# Patient Record
Sex: Female | Born: 1960 | Race: White | Hispanic: No | Marital: Married | Smoking: Never smoker
Health system: Southern US, Community
[De-identification: ages and names within clinical notes are randomized; demographics above are authoritative.]

## PROBLEM LIST (undated history)

## (undated) DIAGNOSIS — Q185 Microstomia: Secondary | ICD-10-CM

## (undated) DIAGNOSIS — I10 Essential (primary) hypertension: Secondary | ICD-10-CM

## (undated) DIAGNOSIS — E039 Hypothyroidism, unspecified: Secondary | ICD-10-CM

## (undated) DIAGNOSIS — M797 Fibromyalgia: Secondary | ICD-10-CM

## (undated) DIAGNOSIS — E7800 Pure hypercholesterolemia, unspecified: Secondary | ICD-10-CM

## (undated) DIAGNOSIS — M1990 Unspecified osteoarthritis, unspecified site: Secondary | ICD-10-CM

## (undated) DIAGNOSIS — Z955 Presence of coronary angioplasty implant and graft: Secondary | ICD-10-CM

## (undated) DIAGNOSIS — Z8639 Personal history of other endocrine, nutritional and metabolic disease: Secondary | ICD-10-CM

## (undated) DIAGNOSIS — E0590 Thyrotoxicosis, unspecified without thyrotoxic crisis or storm: Secondary | ICD-10-CM

## (undated) DIAGNOSIS — N200 Calculus of kidney: Secondary | ICD-10-CM

## (undated) DIAGNOSIS — M19049 Primary osteoarthritis, unspecified hand: Secondary | ICD-10-CM

## (undated) DIAGNOSIS — T4145XA Adverse effect of unspecified anesthetic, initial encounter: Secondary | ICD-10-CM

## (undated) DIAGNOSIS — T8859XA Other complications of anesthesia, initial encounter: Secondary | ICD-10-CM

## (undated) DIAGNOSIS — T884XXA Failed or difficult intubation, initial encounter: Secondary | ICD-10-CM

## (undated) DIAGNOSIS — Z8669 Personal history of other diseases of the nervous system and sense organs: Secondary | ICD-10-CM

## (undated) DIAGNOSIS — Z87442 Personal history of urinary calculi: Secondary | ICD-10-CM

## (undated) DIAGNOSIS — R519 Headache, unspecified: Secondary | ICD-10-CM

## (undated) DIAGNOSIS — I2510 Atherosclerotic heart disease of native coronary artery without angina pectoris: Secondary | ICD-10-CM

## (undated) HISTORY — PX: COLONOSCOPY: SHX174

## (undated) HISTORY — PX: JOINT REPLACEMENT: SHX530

## (undated) HISTORY — PX: DILATION AND CURETTAGE OF UTERUS: SHX78

## (undated) HISTORY — PX: CARDIAC CATHETERIZATION: SHX172

## (undated) HISTORY — PX: KNEE ARTHROPLASTY: SHX992

## (undated) HISTORY — PX: OVARIAN CYST REMOVAL: SHX89

## (undated) HISTORY — PX: COLON SURGERY: SHX602

## (undated) HISTORY — PX: WISDOM TOOTH EXTRACTION: SHX21

## (undated) HISTORY — PX: HAND SURGERY: SHX662

## (undated) HISTORY — PX: FINGER SURGERY: SHX640

## (undated) HISTORY — DX: Calculus of kidney: N20.0

## (undated) HISTORY — PX: HERNIA REPAIR: SHX51

## (undated) HISTORY — PX: ESOPHAGOGASTRODUODENOSCOPY: SHX1529

---

## 1982-01-03 HISTORY — PX: APPENDECTOMY: SHX54

## 1991-01-04 HISTORY — PX: TUBAL LIGATION: SHX77

## 2006-02-07 ENCOUNTER — Ambulatory Visit (HOSPITAL_BASED_OUTPATIENT_CLINIC_OR_DEPARTMENT_OTHER): Admission: RE | Admit: 2006-02-07 | Discharge: 2006-02-07 | Payer: Self-pay | Admitting: Orthopedic Surgery

## 2011-01-04 HISTORY — PX: ABDOMINAL SURGERY: SHX537

## 2011-04-04 DIAGNOSIS — Z955 Presence of coronary angioplasty implant and graft: Secondary | ICD-10-CM

## 2011-04-04 HISTORY — DX: Presence of coronary angioplasty implant and graft: Z95.5

## 2011-04-04 HISTORY — PX: CORONARY ANGIOPLASTY: SHX604

## 2012-01-04 HISTORY — PX: HAND SURGERY: SHX662

## 2012-04-03 HISTORY — PX: ABDOMINAL HERNIA REPAIR: SHX539

## 2012-07-03 DIAGNOSIS — T884XXA Failed or difficult intubation, initial encounter: Secondary | ICD-10-CM

## 2012-07-03 HISTORY — DX: Failed or difficult intubation, initial encounter: T88.4XXA

## 2012-07-03 HISTORY — PX: CHOLECYSTECTOMY: SHX55

## 2012-12-03 DIAGNOSIS — M19049 Primary osteoarthritis, unspecified hand: Secondary | ICD-10-CM

## 2012-12-03 HISTORY — DX: Primary osteoarthritis, unspecified hand: M19.049

## 2012-12-18 ENCOUNTER — Other Ambulatory Visit: Payer: Self-pay | Admitting: Orthopedic Surgery

## 2012-12-21 ENCOUNTER — Encounter (HOSPITAL_BASED_OUTPATIENT_CLINIC_OR_DEPARTMENT_OTHER): Payer: Self-pay | Admitting: *Deleted

## 2012-12-21 NOTE — Pre-Procedure Instructions (Signed)
Cardiac hx. and hx. of difficult intubation discussed with Dr. Krista Blue; pt. OK to come for surgery.

## 2012-12-21 NOTE — Pre-Procedure Instructions (Signed)
EKG, office note, cardiac testing results req. from Dr. Bryson Dames office, [place], [place]

## 2012-12-24 NOTE — H&P (Signed)
Shannon Jennings is an 52 y.o. female.   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 She works at the [employer] in [place]. She has had significant bilateral small joint arthritis and is s/p removal of a mucoid cyst from her right ring finger DIP joint in February 2008.   Following debridement of her joint she went on to a spontaneous arthrodesis of her right ring finger DIP joint. She now has severe pain in both small finger DIP joints that is impairing her work functions and activities of daily living. She returns at this time requesting treatment of her small fingers, specifically fusion as she has been very pleased with her pain relief in the ring finger DIP joint.     Past Medical History  Diagnosis Date  . History of migraine   . Hypothyroidism   . Coronary artery disease   . History of Graves' disease   . Fibromyalgia   . Osteoarthritis   . Thalassemia minor   . 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 Hypertension     under control with med., has been on med. x 4 yr.  . High cholesterol   . S/P coronary artery stent placement 04/2011    states right coronary artery  . Abnormally small mouth   . Difficult intubation 07/2012    Past Surgical History  Procedure Laterality Date  . Cardiac catheterization    . Coronary angioplasty  04/2011    stent x 1  . Cholecystectomy  07/2012  . Abdominal hernia repair  04/2012  . Abdominal surgery  01/2011    for intestinal blockage    History reviewed. No pertinent family history. Social History:  reports that she has never smoked. She has never used smokeless tobacco. She reports that she does not drink alcohol or use illicit drugs.  Allergies:  Allergies  Allergen Reactions  . Penicillins Rash  . Sulfa Antibiotics Rash    No prescriptions prior to admission    No results found for  this or any previous visit (from the past 48 hour(s)).  No results found.   Pertinent items are noted in HPI.  Height 5\' 3"  (1.6 m), weight 61.236 kg (135 lb).  General appearance: alert Head: Normocephalic, without obvious abnormality Neck: supple, symmetrical, trachea midline Resp: clear to auscultation bilaterally Cardio: regular rate and rhythm GI: normal findings: bowel sounds normal Extremities:. Inspection of her hands reveals Heberden's nodes. She is clinically solid and pain free arthrodesis of her right ring finger DIP joint. She has pain with any motion of her small finger DIP joints. She has well preserved flexion of the PIP joints. Her thumbs reveal mild CMC arthrosis with pain on stress testing. Her pulses and capillary refill are intact. Her MP joint motion is full.  X-rays of her small fingers bilaterally reveals bone on bone arthropathy. Her PIP and MP joints have preserved hyaline cartilage spaces.    Pulses: 2+ and symmetric Skin: mobility and turgor normal Neurologic: Grossly normal    Assessment/Plan Impression: End stage OA degeneration right and left small finger DIP joints  Plan: To the OR for arthrodesis right and left small finger DIP joints.The procedure, risks,benefits and post-op course were discussed with the patient at length and they were in agreement with the plan.  DASNOIT,Meir Elwood J 12/24/2012, 3:19 PM  H&P documentation:  12/25/2012  -History and Physical Reviewed  -Patient has been re-examined  -No change in the plan of care  Wyn Forster, MD

## 2012-12-25 ENCOUNTER — Encounter (HOSPITAL_BASED_OUTPATIENT_CLINIC_OR_DEPARTMENT_OTHER): Payer: Self-pay | Admitting: Orthopedic Surgery

## 2012-12-25 ENCOUNTER — Encounter (HOSPITAL_BASED_OUTPATIENT_CLINIC_OR_DEPARTMENT_OTHER): Payer: BC Managed Care – PPO | Admitting: Anesthesiology

## 2012-12-25 ENCOUNTER — Ambulatory Visit (HOSPITAL_BASED_OUTPATIENT_CLINIC_OR_DEPARTMENT_OTHER): Payer: BC Managed Care – PPO | Admitting: Anesthesiology

## 2012-12-25 ENCOUNTER — Encounter (HOSPITAL_BASED_OUTPATIENT_CLINIC_OR_DEPARTMENT_OTHER)
Admission: RE | Disposition: A | Discharge status: Home / Self Care | Payer: Self-pay | Source: Ambulatory Visit | Attending: Orthopedic Surgery

## 2012-12-25 ENCOUNTER — Ambulatory Visit (HOSPITAL_BASED_OUTPATIENT_CLINIC_OR_DEPARTMENT_OTHER)
Admission: RE | Admit: 2012-12-25 | Discharge: 2012-12-25 | Disposition: A | Discharge status: Home / Self Care | Payer: BC Managed Care – PPO | Source: Ambulatory Visit | Attending: Orthopedic Surgery | Admitting: Orthopedic Surgery

## 2012-12-25 DIAGNOSIS — Z9861 Coronary angioplasty status: Secondary | ICD-10-CM | POA: Insufficient documentation

## 2012-12-25 DIAGNOSIS — Z880 Allergy status to penicillin: Secondary | ICD-10-CM | POA: Insufficient documentation

## 2012-12-25 DIAGNOSIS — M19049 Primary osteoarthritis, unspecified hand: Secondary | ICD-10-CM | POA: Insufficient documentation

## 2012-12-25 DIAGNOSIS — D563 Thalassemia minor: Secondary | ICD-10-CM | POA: Insufficient documentation

## 2012-12-25 DIAGNOSIS — E039 Hypothyroidism, unspecified: Secondary | ICD-10-CM | POA: Insufficient documentation

## 2012-12-25 DIAGNOSIS — E7800 Pure hypercholesterolemia, unspecified: Secondary | ICD-10-CM | POA: Insufficient documentation

## 2012-12-25 DIAGNOSIS — I10 Essential (primary) hypertension: Secondary | ICD-10-CM | POA: Insufficient documentation

## 2012-12-25 DIAGNOSIS — M064 Inflammatory polyarthropathy: Secondary | ICD-10-CM | POA: Insufficient documentation

## 2012-12-25 DIAGNOSIS — I2510 Atherosclerotic heart disease of native coronary artery without angina pectoris: Secondary | ICD-10-CM | POA: Insufficient documentation

## 2012-12-25 HISTORY — DX: Fibromyalgia: M79.7

## 2012-12-25 HISTORY — DX: Personal history of other diseases of the nervous system and sense organs: Z86.69

## 2012-12-25 HISTORY — PX: DISTAL INTERPHALANGEAL JOINT FUSION: SHX6428

## 2012-12-25 HISTORY — DX: Unspecified osteoarthritis, unspecified site: M19.90

## 2012-12-25 HISTORY — DX: Atherosclerotic heart disease of native coronary artery without angina pectoris: I25.10

## 2012-12-25 HISTORY — DX: Personal history of other endocrine, nutritional and metabolic disease: Z86.39

## 2012-12-25 HISTORY — DX: Microstomia: Q18.5

## 2012-12-25 HISTORY — DX: Presence of coronary angioplasty implant and graft: Z95.5

## 2012-12-25 HISTORY — DX: Hypothyroidism, unspecified: E03.9

## 2012-12-25 HISTORY — DX: Essential (primary) hypertension: I10

## 2012-12-25 HISTORY — DX: Primary osteoarthritis, unspecified hand: M19.049

## 2012-12-25 HISTORY — DX: Failed or difficult intubation, initial encounter: T88.4XXA

## 2012-12-25 HISTORY — DX: Pure hypercholesterolemia, unspecified: E78.00

## 2012-12-25 LAB — POCT HEMOGLOBIN-HEMACUE: Hemoglobin: 10.7 g/dL — ABNORMAL LOW (ref 12.0–15.0)

## 2012-12-25 SURGERY — DISTAL INTERPHALANGEAL JOINT FUSION
Anesthesia: General | Site: Finger | Laterality: Bilateral

## 2012-12-25 MED ORDER — OXYCODONE HCL 5 MG PO TABS
ORAL_TABLET | ORAL | Status: AC
  Filled 2012-12-25: qty 1

## 2012-12-25 MED ORDER — LACTATED RINGERS IV SOLN
INTRAVENOUS | Status: DC
  Administered 2012-12-25 (×2): via INTRAVENOUS

## 2012-12-25 MED ORDER — MIDAZOLAM HCL 2 MG/2ML IJ SOLN
INTRAMUSCULAR | Status: AC
  Filled 2012-12-25: qty 2

## 2012-12-25 MED ORDER — MIDAZOLAM HCL 2 MG/2ML IJ SOLN: 1.0000 mg | INTRAMUSCULAR | Status: DC | PRN

## 2012-12-25 MED ORDER — FENTANYL CITRATE 0.05 MG/ML IJ SOLN
INTRAMUSCULAR | Status: AC
  Filled 2012-12-25: qty 2

## 2012-12-25 MED ORDER — OXYCODONE HCL 5 MG PO TABS
5.0000 mg | ORAL_TABLET | Freq: Once | ORAL | Status: AC | PRN
  Administered 2012-12-25: 5 mg via ORAL

## 2012-12-25 MED ORDER — LIDOCAINE HCL (CARDIAC) 20 MG/ML IV SOLN
INTRAVENOUS | Status: DC | PRN
  Administered 2012-12-25: 80 mg via INTRAVENOUS

## 2012-12-25 MED ORDER — HYDROMORPHONE HCL 2 MG PO TABS: ORAL_TABLET | ORAL | Status: DC

## 2012-12-25 MED ORDER — HYDROMORPHONE HCL PF 1 MG/ML IJ SOLN
0.2500 mg | INTRAMUSCULAR | Status: DC | PRN
  Administered 2012-12-25 (×4): 0.5 mg via INTRAVENOUS

## 2012-12-25 MED ORDER — ONDANSETRON HCL 4 MG/2ML IJ SOLN
INTRAMUSCULAR | Status: DC | PRN
  Administered 2012-12-25: 4 mg via INTRAVENOUS

## 2012-12-25 MED ORDER — HYDROMORPHONE HCL PF 1 MG/ML IJ SOLN
INTRAMUSCULAR | Status: AC
  Filled 2012-12-25: qty 1

## 2012-12-25 MED ORDER — PROPOFOL 10 MG/ML IV BOLUS
INTRAVENOUS | Status: DC | PRN
  Administered 2012-12-25: 150 mg via INTRAVENOUS

## 2012-12-25 MED ORDER — VANCOMYCIN HCL IN DEXTROSE 1-5 GM/200ML-% IV SOLN
1000.0000 mg | INTRAVENOUS | Status: AC
  Administered 2012-12-25: 1000 mg via INTRAVENOUS

## 2012-12-25 MED ORDER — FENTANYL CITRATE 0.05 MG/ML IJ SOLN
INTRAMUSCULAR | Status: DC | PRN
  Administered 2012-12-25 (×3): 25 ug via INTRAVENOUS
  Administered 2012-12-25: 100 ug via INTRAVENOUS
  Administered 2012-12-25: 25 ug via INTRAVENOUS

## 2012-12-25 MED ORDER — DOXYCYCLINE HYCLATE 100 MG PO TABS: 100.0000 mg | ORAL_TABLET | Freq: Two times a day (BID) | ORAL | Status: DC

## 2012-12-25 MED ORDER — ONDANSETRON HCL 4 MG/2ML IJ SOLN: 4.0000 mg | Freq: Once | INTRAMUSCULAR | Status: DC | PRN

## 2012-12-25 MED ORDER — LIDOCAINE HCL 2 % IJ SOLN
INTRAMUSCULAR | Status: DC | PRN
  Administered 2012-12-25: 5.5 mL

## 2012-12-25 MED ORDER — MEPERIDINE HCL 25 MG/ML IJ SOLN: 6.2500 mg | INTRAMUSCULAR | Status: DC | PRN

## 2012-12-25 MED ORDER — OXYCODONE HCL 5 MG/5ML PO SOLN: 5.0000 mg | Freq: Once | ORAL | Status: AC | PRN

## 2012-12-25 MED ORDER — DEXAMETHASONE SODIUM PHOSPHATE 10 MG/ML IJ SOLN
INTRAMUSCULAR | Status: DC | PRN
  Administered 2012-12-25: 10 mg via INTRAVENOUS

## 2012-12-25 MED ORDER — MIDAZOLAM HCL 5 MG/5ML IJ SOLN
INTRAMUSCULAR | Status: DC | PRN
  Administered 2012-12-25: 2 mg via INTRAVENOUS

## 2012-12-25 MED ORDER — FENTANYL CITRATE 0.05 MG/ML IJ SOLN: 50.0000 ug | INTRAMUSCULAR | Status: DC | PRN

## 2012-12-25 MED ORDER — VANCOMYCIN HCL IN DEXTROSE 1-5 GM/200ML-% IV SOLN
INTRAVENOUS | Status: AC
  Filled 2012-12-25: qty 200

## 2012-12-25 MED ORDER — CHLORHEXIDINE GLUCONATE 4 % EX LIQD: 60.0000 mL | Freq: Once | CUTANEOUS | Status: DC

## 2012-12-25 MED ORDER — MIDAZOLAM HCL 2 MG/ML PO SYRP: 12.0000 mg | ORAL_SOLUTION | Freq: Once | ORAL | Status: DC | PRN

## 2012-12-25 SURGICAL SUPPLY — 76 items
BANDAGE ADH SHEER 1  50/CT (GAUZE/BANDAGES/DRESSINGS) IMPLANT
BANDAGE COBAN STERILE 2 (GAUZE/BANDAGES/DRESSINGS) IMPLANT
BANDAGE ELASTIC 3 VELCRO ST LF (GAUZE/BANDAGES/DRESSINGS) IMPLANT
BLADE MINI RND TIP GREEN BEAV (BLADE) IMPLANT
BLADE SURG 15 STRL LF DISP TIS (BLADE) ×1 IMPLANT
BLADE SURG 15 STRL SS (BLADE) ×2
BNDG CMPR 9X4 STRL LF SNTH (GAUZE/BANDAGES/DRESSINGS) ×1
BNDG CMPR MD 5X2 ELC HKLP STRL (GAUZE/BANDAGES/DRESSINGS)
BNDG COHESIVE 1X5 TAN STRL LF (GAUZE/BANDAGES/DRESSINGS) ×3 IMPLANT
BNDG COHESIVE 3X5 TAN STRL LF (GAUZE/BANDAGES/DRESSINGS) IMPLANT
BNDG CONFORM 3 STRL LF (GAUZE/BANDAGES/DRESSINGS) IMPLANT
BNDG ELASTIC 2 VLCR STRL LF (GAUZE/BANDAGES/DRESSINGS) IMPLANT
BNDG ESMARK 4X9 LF (GAUZE/BANDAGES/DRESSINGS) ×2 IMPLANT
BNDG GAUZE ELAST 4 BULKY (GAUZE/BANDAGES/DRESSINGS) IMPLANT
BRUSH SCRUB EZ PLAIN DRY (MISCELLANEOUS) ×3 IMPLANT
BUR EGG/OVAL CARBIDE (BURR) IMPLANT
BUR FAST CUTTING MED (BURR) IMPLANT
CORDS BIPOLAR (ELECTRODE) ×2 IMPLANT
COVER MAYO STAND STRL (DRAPES) ×2 IMPLANT
COVER TABLE BACK 60X90 (DRAPES) ×2 IMPLANT
CUFF TOURNIQUET SINGLE 18IN (TOURNIQUET CUFF) ×2 IMPLANT
DECANTER SPIKE VIAL GLASS SM (MISCELLANEOUS) IMPLANT
DRAPE EXTREMITY T 121X128X90 (DRAPE) ×2 IMPLANT
DRAPE OEC MINIVIEW 54X84 (DRAPES) ×2 IMPLANT
DRAPE SURG 17X23 STRL (DRAPES) ×3 IMPLANT
DRSG TEGADERM 4X4.75 (GAUZE/BANDAGES/DRESSINGS) IMPLANT
GAUZE XEROFORM 1X8 LF (GAUZE/BANDAGES/DRESSINGS) ×1 IMPLANT
GLOVE BIO SURGEON STRL SZ7 (GLOVE) ×1 IMPLANT
GLOVE BIOGEL M STRL SZ7.5 (GLOVE) ×2 IMPLANT
GLOVE BIOGEL PI IND STRL 8 (GLOVE) ×1 IMPLANT
GLOVE BIOGEL PI INDICATOR 8 (GLOVE) ×1
GLOVE ORTHO TXT STRL SZ7.5 (GLOVE) ×2 IMPLANT
GOWN BRE IMP PREV XXLGXLNG (GOWN DISPOSABLE) ×4 IMPLANT
GOWN PREVENTION PLUS XLARGE (GOWN DISPOSABLE) ×1 IMPLANT
GOWN PREVENTION PLUS XXLARGE (GOWN DISPOSABLE) ×1 IMPLANT
K-WIRE .035X4 (WIRE) ×1 IMPLANT
K-WIRE .045X4 (WIRE) IMPLANT
K-WIRE PROS .028 4 (WIRE) ×2 IMPLANT
LOOP VESSEL MAXI BLUE (MISCELLANEOUS) IMPLANT
NDL HYPO 25X1 1.5 SAFETY (NEEDLE) IMPLANT
NDL SAFETY ECLIPSE 18X1.5 (NEEDLE) IMPLANT
NEEDLE 27GAX1X1/2 (NEEDLE) ×1 IMPLANT
NEEDLE HYPO 18GX1.5 SHARP (NEEDLE)
NEEDLE HYPO 25X1 1.5 SAFETY (NEEDLE) IMPLANT
NS IRRIG 1000ML POUR BTL (IV SOLUTION) ×2 IMPLANT
PACK BASIN DAY SURGERY FS (CUSTOM PROCEDURE TRAY) ×2 IMPLANT
PAD CAST 3X4 CTTN HI CHSV (CAST SUPPLIES) IMPLANT
PADDING CAST ABS 4INX4YD NS (CAST SUPPLIES) ×1
PADDING CAST ABS COTTON 4X4 ST (CAST SUPPLIES) ×1 IMPLANT
PADDING CAST COTTON 3X4 STRL (CAST SUPPLIES)
PADDING UNDERCAST 2  STERILE (CAST SUPPLIES) ×2 IMPLANT
SCREW SELF TAP CORTEX 1.0 14MM (Screw) ×2 IMPLANT
SLEEVE SCD COMPRESS KNEE MED (MISCELLANEOUS) ×1 IMPLANT
SPLINT FINGER 2.25 911902 (SOFTGOODS) ×2 IMPLANT
SPLINT PLASTER CAST XFAST 3X15 (CAST SUPPLIES) IMPLANT
SPLINT PLASTER XTRA FASTSET 3X (CAST SUPPLIES)
SPONGE GAUZE 4X4 12PLY (GAUZE/BANDAGES/DRESSINGS) ×2 IMPLANT
STOCKINETTE 4X48 STRL (DRAPES) ×3 IMPLANT
STRIP CLOSURE SKIN 1/2X4 (GAUZE/BANDAGES/DRESSINGS) ×1 IMPLANT
SUT ETHILON 5 0 P 3 18 (SUTURE)
SUT FIBERWIRE 3-0 18 TAPR NDL (SUTURE)
SUT MERSILENE 4 0 P 3 (SUTURE) ×1 IMPLANT
SUT NYLON ETHILON 5-0 P-3 1X18 (SUTURE) IMPLANT
SUT PROLENE 3 0 PS 2 (SUTURE) IMPLANT
SUT PROLENE 4 0 P 3 18 (SUTURE) ×1 IMPLANT
SUT STEEL 0 (SUTURE) ×2
SUT STEEL 0 18XMFL TIE 17 (SUTURE) IMPLANT
SUT VIC AB 4-0 P-3 18XBRD (SUTURE) IMPLANT
SUT VIC AB 4-0 P3 18 (SUTURE)
SUTURE FIBERWR 3-0 18 TAPR NDL (SUTURE) IMPLANT
SYR 3ML 23GX1 SAFETY (SYRINGE) IMPLANT
SYR BULB 3OZ (MISCELLANEOUS) ×2 IMPLANT
SYR CONTROL 10ML LL (SYRINGE) ×2 IMPLANT
TOWEL OR 17X24 6PK STRL BLUE (TOWEL DISPOSABLE) ×4 IMPLANT
TRAY DSU PREP LF (CUSTOM PROCEDURE TRAY) ×2 IMPLANT
UNDERPAD 30X30 INCONTINENT (UNDERPADS AND DIAPERS) ×2 IMPLANT

## 2012-12-25 NOTE — Op Note (Signed)
NAMEJEMIMAH, Shannon Jennings                 ACCOUNT NO.:  [id number]  MEDICAL RECORD NO.:  [id number]  LOCATION:                                 FACILITY:  PHYSICIAN:  Shannon Fitch. Emrik Erhard, M.D.      DATE OF BIRTH:  DATE OF PROCEDURE:  12/25/2012 DATE OF DISCHARGE:                              OPERATIVE REPORT   PREOPERATIVE DIAGNOSIS:  Severe osteoarthritis with painful distal interphalangeal joint degenerative arthritis/inflammatory arthritis.  POSTOPERATIVE DIAGNOSIS:  Severe osteoarthritis with painful distal interphalangeal joint degenerative arthritis/inflammatory arthritis.  OPERATION: 1. Arthrodesis of right small finger DIP joint at 30-degree flexion     utilizing transosseous 1.1 mm ASIF screws. 2. Arthrodesis of left small finger DIP joint at 20-degree flexion     utilizing Kirschner wire and figure-of-eight tension band     construct.  OPERATING SURGEON:  Shannon Fitch. Najai Waszak, M.D.  ASSISTANT:  Marveen Reeks Dasnoit, PA-C  ANESTHESIA:  General by LMA supplemented by right small and left small digital blocks placed at the conclusion of surgery for perioperative analgesia.  SUPERVISING ANESTHESIOLOGIST:  Kaylyn Layer. Michelle Piper, M.D.  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 Pollyann Savoy for bilateral small finger distal interphalangeal joint arthrodesis.  She has a history of very painful swelling and impaired motion of her small finger DIP joint.  This is troublesome to the point where she requests arthrodesis.  She has had a previous mucoid cyst surgery in the past. Preoperatively, she was advised of the complexity of fusing DIP joints. Despite our best efforts due to the small bone structure of the small finger DIP joint, it is very difficult to use inter-medullary devices. We typically use tension band or lag screw technique.  After detailed informed consent, she is brought to the operating room  at this time.  We did inform her there is about 10% chance of nonunion. Anesthesia consultation was provided by Dr. Michelle Piper.  He recommended general anesthesia by LMA technique.  This was accepted by Shannon Jennings.  She was subsequently transferred to room 6 of the [facility] and placed in a supine position upon the operating table. Following induction of general anesthesia by LMA technique under Dr. Deirdre Priest direct supervision, her right and left arms were prepped with Betadine soap and solution, sterilely draped.  A pneumatic tourniquet was applied to the proximal forearm on the right due to antecubital IV access on the right, and a pneumatic tourniquet was applied to the proximal left brachium.  1 g of vancomycin was administered as an IV prophylactic antibiotic due to allergies to penicillin and sulfa antibiotics.  Following exsanguination of the right hand and arm with a direct compression technique, the arterial tourniquet was inflated on the proximal forearm to 220 mmHg.  The procedure commenced with a curvilinear incision on the dorsal aspect of the DIP joint.  The extensor mechanism was exposed and transected 3 mm proximal to its insertion at the distal phalanx.  The DIP joint was opened by releasing the collateral ligaments, and a cup and cone type arthrodesis was accomplished with a power bur and rongeur.  On the right side, we chose to flex the joint 30 degrees to facilitate grasp and to prevent a gap in the ulnar aspect of the hand.  With some challenges, a pair of 1.1 mm ASIF fully-threaded screws were placed across the joint with provisional fixation with 0.028 Kirschner wire fixation.  We obtained one excellent purchase across the joint and chose to place a more ulnar position screw that is actually along the cortex with excellent purchase in the middle phalanx and purchase distally in the distal phalanx.  As this was in the periosteum and the interphalangeal  ligament region, this would provide excellent fixation and did provide compression.  This screw was accepted in this position.  The position of the joint was checked with C-arm fluoroscope and deemed satisfactory.  Closing the finger with tenodesis effect led to a very satisfactory cascade.  We subsequently repaired the extensor mechanism with figure-of-eight sutures of 4-0 Mersilene and the skin with intradermal 4-0 Prolene. Steri-Strips were applied followed by digital block with 2% lidocaine. The finger was dressed with sterile gauze, sterile Coban, and Alumafoam splint anchored at the wrist level.  The tourniquet was released on the right with immediate capillary refill to the fingers and thumb.  Attention was then directed to the left small finger.  Following exsanguination of the left arm with an Esmarch bandage, the arterial tourniquet was inflated 220 mmHg.  A similar curvilinear incision was fashioned exposing the extensor mechanism.  The joint was opened in shotgun style and a cup and cone arthrodesis accomplished with rongeur and a power bur.  The joint was then secured by 0.028-inch Kirschner wire at 20 degrees flexion and a 26-gauge tension band was placed.  The tension band was adjusted to allow proper position of the joint.  C-arm fluoroscopy was used to confirm satisfactory position of the arthrodesis.  We specifically chose a lesser position for the nondominant hand.  The extensor mechanism was likewise repaired with figure-of-eight sutures of 4-0 Mersilene followed by repair of the skin with corner sutures and intradermal 4-0 Prolene sutures.  Steri-Strips were applied followed by digital block with 2% lidocaine.  The finger was dressed with sterile gauze, sterile Coban, and Alumafoam splint with a Coban dressing anchored at the wrist level.  Shannon Jennings tolerated the surgery and anesthesia well.  She was transferred to the recovery room with stable signs.  She  will be discharged to the care of her husband with prescriptions for doxycycline 100 mg p.o. b.i.d., total of 8 tablets and Dilaudid 2 mg 1-2 tablets p.o. q.4-6 hours p.r.n. pain, 30 tablets without refill.  We anticipate seeing her back for followup in 6-8 days at our office.     Shannon Jennings, M.D.     RVS/MEDQ  D:  12/25/2012  T:  12/25/2012  Job:  [id number]  cc:   Pollyann Savoy, M.D.

## 2012-12-25 NOTE — Anesthesia Procedure Notes (Signed)
Procedure Name: LMA Insertion Date/Time: 12/25/2012 1:56 PM Performed by: Burna Cash Pre-anesthesia Checklist: Patient identified, Emergency Drugs available, Suction available and Patient being monitored Patient Re-evaluated:Patient Re-evaluated prior to inductionOxygen Delivery Method: Circle System Utilized Preoxygenation: Pre-oxygenation with 100% oxygen Intubation Type: IV induction Ventilation: Mask ventilation without difficulty LMA: LMA inserted LMA Size: 3.0 Number of attempts: 1 Airway Equipment and Method: bite block Placement Confirmation: positive ETCO2 Tube secured with: Tape Dental Injury: Teeth and Oropharynx as per pre-operative assessment

## 2012-12-25 NOTE — Op Note (Signed)
[id number] 

## 2012-12-25 NOTE — Anesthesia Postprocedure Evaluation (Signed)
Anesthesia Post Note  Patient: Shannon Jennings  Procedure(s) Performed: Procedure(s) (LRB): DISTAL INTERPHALANGEAL JOINT FUSION RIGHT AND LEFT SMALL FINGER (Bilateral)  Anesthesia type: general  Patient location: PACU  Post pain: Pain level controlled  Post assessment: Patient's Cardiovascular Status Stable  Last Vitals:  Filed Vitals:   12/25/12 1615  BP: 157/80  Pulse: 79  Temp:   Resp: 14    Post vital signs: Reviewed and stable  Level of consciousness: sedated  Complications: No apparent anesthesia complications

## 2012-12-25 NOTE — Anesthesia Preprocedure Evaluation (Signed)
Anesthesia Evaluation  Patient identified by MRN, date of birth, ID band Patient awake    Reviewed: Allergy & Precautions, H&P , NPO status   History of Anesthesia Complications (+) DIFFICULT AIRWAY and history of anesthetic complications  Airway Mallampati: I TM Distance: >3 FB Neck ROM: Full    Dental   Pulmonary          Cardiovascular hypertension, Pt. on medications + CAD and + Cardiac Stents     Neuro/Psych    GI/Hepatic   Endo/Other  Hypothyroidism   Renal/GU      Musculoskeletal   Abdominal   Peds  Hematology   Anesthesia Other Findings   Reproductive/Obstetrics                           Anesthesia Physical Anesthesia Plan  ASA: III  Anesthesia Plan: General   Post-op Pain Management:    Induction: Intravenous  Airway Management Planned: LMA  Additional Equipment:   Intra-op Plan:   Post-operative Plan: Extubation in OR  Informed Consent: I have reviewed the patients History and Physical, chart, labs and discussed the procedure including the risks, benefits and alternatives for the proposed anesthesia with the patient or authorized representative who has indicated his/her understanding and acceptance.     Plan Discussed with: CRNA and Surgeon  Anesthesia Plan Comments: (H/O difficult airway in [place] needing Glidescope.)        Anesthesia Quick Evaluation

## 2012-12-25 NOTE — Brief Op Note (Signed)
12/25/2012  3:37 PM  PATIENT:  Shannon Jennings  52 y.o. female  PRE-OPERATIVE DIAGNOSIS:  SEVERE SMALL DISTAL INTERPHALANGEAL JOINT DEGENERATIVE JOINT DISEASE RIGHT AND LEFT SMALL FINGER  POST-OPERATIVE DIAGNOSIS:  SEVERE SMALL DISTAL INTERPHALANGEAL JOINT DEGENERATIVE JOINT DISEASE RIGHT AND LEFT SMALL FINGER  PROCEDURE:  Procedure(s) with comments: DISTAL INTERPHALANGEAL JOINT FUSION RIGHT AND LEFT SMALL FINGER (Bilateral) - left and right small finger  SURGEON:  Surgeon(s) and Role:    * Wyn Forster., MD - Primary  PHYSICIAN ASSISTANT:   ASSISTANTS: Mallory Shirk.A-C   ANESTHESIA:   general  EBL:  Total I/O In: 1000 [I.V.:1000] Out: -   BLOOD ADMINISTERED:none  DRAINS: none   LOCAL MEDICATIONS USED:  LIDOCAINE   SPECIMEN:  No Specimen  DISPOSITION OF SPECIMEN:  N/A  COUNTS:  YES  TOURNIQUET:   Total Tourniquet Time Documented: Forearm (Left) - 43 minutes Forearm (Left) - 28 minutes Total: Forearm (Left) - 71 minutes   DICTATION: .Other Dictation: Dictation Number [id number]  PLAN OF CARE: Discharge to home after PACU  PATIENT DISPOSITION:  PACU - hemodynamically stable.   Delay start of Pharmacological VTE agent (>24hrs) due to surgical blood loss or risk of bleeding: not applicable

## 2012-12-25 NOTE — Transfer of Care (Signed)
Immediate Anesthesia Transfer of Care Note  Patient: Shannon Jennings  Procedure(s) Performed: Procedure(s) with comments: DISTAL INTERPHALANGEAL JOINT FUSION RIGHT AND LEFT SMALL FINGER (Bilateral) - left and right small finger  Patient Location: PACU  Anesthesia Type:General  Level of Consciousness: awake, alert  and oriented  Airway & Oxygen Therapy: Patient Spontanous Breathing and Patient connected to face mask oxygen  Post-op Assessment: Report given to PACU RN and Post -op Vital signs reviewed and stable  Post vital signs: Reviewed and stable  Complications: No apparent anesthesia complications

## 2012-12-31 ENCOUNTER — Encounter (HOSPITAL_BASED_OUTPATIENT_CLINIC_OR_DEPARTMENT_OTHER): Payer: Self-pay | Admitting: Orthopedic Surgery

## 2014-01-03 HISTORY — PX: HAND SURGERY: SHX662

## 2014-08-18 ENCOUNTER — Other Ambulatory Visit: Payer: Self-pay | Admitting: Orthopedic Surgery

## 2015-10-07 ENCOUNTER — Ambulatory Visit (INDEPENDENT_AMBULATORY_CARE_PROVIDER_SITE_OTHER): Payer: Self-pay | Admitting: Rheumatology

## 2015-10-14 ENCOUNTER — Ambulatory Visit (INDEPENDENT_AMBULATORY_CARE_PROVIDER_SITE_OTHER): Payer: BLUE CROSS/BLUE SHIELD | Admitting: Rheumatology

## 2015-10-14 DIAGNOSIS — M1711 Unilateral primary osteoarthritis, right knee: Secondary | ICD-10-CM

## 2015-10-14 DIAGNOSIS — M1712 Unilateral primary osteoarthritis, left knee: Secondary | ICD-10-CM | POA: Diagnosis not present

## 2015-10-21 ENCOUNTER — Ambulatory Visit (INDEPENDENT_AMBULATORY_CARE_PROVIDER_SITE_OTHER): Payer: BLUE CROSS/BLUE SHIELD | Admitting: Rheumatology

## 2015-10-21 DIAGNOSIS — M1711 Unilateral primary osteoarthritis, right knee: Secondary | ICD-10-CM | POA: Diagnosis not present

## 2015-10-21 DIAGNOSIS — M1712 Unilateral primary osteoarthritis, left knee: Secondary | ICD-10-CM

## 2015-10-28 ENCOUNTER — Telehealth: Payer: Self-pay | Admitting: Radiology

## 2015-10-28 NOTE — Telephone Encounter (Signed)
I have gotten a refill request for her Celebrex, but this medication has not been sent in for her since 2013, need to discuss with her first, she may want to discuss with PCP first, she has HTN /  Left message for her to call me back to discuss

## 2015-10-28 NOTE — Telephone Encounter (Signed)
Patient returned Amy's phone call about celebrex refill.

## 2015-11-02 ENCOUNTER — Telehealth: Payer: Self-pay | Admitting: Radiology

## 2015-11-02 ENCOUNTER — Telehealth: Payer: Self-pay | Admitting: Rheumatology

## 2015-11-02 NOTE — Telephone Encounter (Signed)
I have called her again regarding Celebrex. Unable to reach, left another.

## 2015-11-02 NOTE — Telephone Encounter (Signed)
Patient left message stating she was returning Amy's phone call about a medication refill.

## 2015-11-02 NOTE — Telephone Encounter (Signed)
I did speak to patient about the Celebrex/ she states she has a refill and does not need the med sent in.

## 2015-11-02 NOTE — Telephone Encounter (Signed)
Thanks will call again/ see previous messages  I want to know about Celebrex

## 2015-12-14 ENCOUNTER — Other Ambulatory Visit: Payer: Self-pay | Admitting: Rheumatology

## 2015-12-14 ENCOUNTER — Other Ambulatory Visit: Payer: Self-pay | Admitting: *Deleted

## 2015-12-14 NOTE — Telephone Encounter (Signed)
Left message to advise patient we need labs to fill her Celebrex.

## 2015-12-31 MED ORDER — ZOLPIDEM TARTRATE 5 MG PO TABS: 5.0000 mg | ORAL_TABLET | Freq: Every evening | ORAL | 5 refills | Status: DC | PRN

## 2015-12-31 NOTE — Telephone Encounter (Signed)
Last Visit: 08/18/15 Next Visit: 02/22/15  Okay to refill Ambien?

## 2015-12-31 NOTE — Addendum Note (Signed)
Addended by: Carole Binning on: 12/31/2015 11:03 AM   Modules accepted: Orders

## 2015-12-31 NOTE — Telephone Encounter (Signed)
Patient states she doesn't need a refill of Celebrex but does need a refill of Ambien. Please mail lab order to patient so she can get those drawn.

## 2016-01-01 ENCOUNTER — Other Ambulatory Visit: Payer: Self-pay | Admitting: *Deleted

## 2016-01-01 MED ORDER — ZOLPIDEM TARTRATE 5 MG PO TABS: 5.0000 mg | ORAL_TABLET | Freq: Every evening | ORAL | 5 refills | Status: DC | PRN

## 2016-01-01 NOTE — Telephone Encounter (Signed)
Printer ink had run out. Unable to read prescription for Ambien. Prescription reprinted.

## 2016-01-16 ENCOUNTER — Other Ambulatory Visit: Payer: Self-pay | Admitting: Rheumatology

## 2016-01-18 ENCOUNTER — Other Ambulatory Visit: Payer: Self-pay | Admitting: Rheumatology

## 2016-01-18 NOTE — Telephone Encounter (Signed)
Patient is requesting refill of Celebrex be sent to [facility] in [place].

## 2016-01-19 ENCOUNTER — Telehealth: Payer: Self-pay | Admitting: Rheumatology

## 2016-01-19 NOTE — Telephone Encounter (Signed)
Can we order x-rays for her knees when she comes in on Friday for her labs?

## 2016-01-19 NOTE — Telephone Encounter (Signed)
Patient returned Andrea's call. She states she will come in Friday for labs and is requesting Dr. Estanislado Pandy to please order xrays of her knee while she is here on Friday for labs. Patient ask that you please leave a message on her phone, she is in a class and might not be able to answer the phone.

## 2016-01-19 NOTE — Telephone Encounter (Signed)
Left message for patient advising she needs lab work.

## 2016-01-19 NOTE — Telephone Encounter (Signed)
Last Visit: 08/18/15 Next Visit" 02/26/16

## 2016-01-19 NOTE — Telephone Encounter (Signed)
Yes, you may place the order

## 2016-01-22 ENCOUNTER — Ambulatory Visit (INDEPENDENT_AMBULATORY_CARE_PROVIDER_SITE_OTHER): Payer: BLUE CROSS/BLUE SHIELD | Admitting: Rheumatology

## 2016-01-22 ENCOUNTER — Other Ambulatory Visit (INDEPENDENT_AMBULATORY_CARE_PROVIDER_SITE_OTHER): Payer: Self-pay

## 2016-01-22 ENCOUNTER — Other Ambulatory Visit: Payer: Self-pay | Admitting: Radiology

## 2016-01-22 ENCOUNTER — Encounter: Payer: Self-pay | Admitting: Rheumatology

## 2016-01-22 VITALS — BP 125/74 | HR 74 | Ht 63.0 in | Wt 149.0 lb

## 2016-01-22 DIAGNOSIS — Z5181 Encounter for therapeutic drug level monitoring: Secondary | ICD-10-CM

## 2016-01-22 DIAGNOSIS — M170 Bilateral primary osteoarthritis of knee: Secondary | ICD-10-CM

## 2016-01-22 DIAGNOSIS — M19042 Primary osteoarthritis, left hand: Secondary | ICD-10-CM | POA: Diagnosis not present

## 2016-01-22 DIAGNOSIS — Z862 Personal history of diseases of the blood and blood-forming organs and certain disorders involving the immune mechanism: Secondary | ICD-10-CM

## 2016-01-22 DIAGNOSIS — I10 Essential (primary) hypertension: Secondary | ICD-10-CM | POA: Diagnosis not present

## 2016-01-22 DIAGNOSIS — Z79899 Other long term (current) drug therapy: Secondary | ICD-10-CM

## 2016-01-22 DIAGNOSIS — E785 Hyperlipidemia, unspecified: Secondary | ICD-10-CM | POA: Insufficient documentation

## 2016-01-22 DIAGNOSIS — Z8639 Personal history of other endocrine, nutritional and metabolic disease: Secondary | ICD-10-CM | POA: Diagnosis not present

## 2016-01-22 DIAGNOSIS — Z8679 Personal history of other diseases of the circulatory system: Secondary | ICD-10-CM | POA: Insufficient documentation

## 2016-01-22 DIAGNOSIS — M797 Fibromyalgia: Secondary | ICD-10-CM

## 2016-01-22 DIAGNOSIS — M19041 Primary osteoarthritis, right hand: Secondary | ICD-10-CM | POA: Diagnosis not present

## 2016-01-22 LAB — CBC WITH DIFFERENTIAL/PLATELET
Basophils Absolute: 82 cells/uL (ref 0–200)
Basophils Relative: 1 %
Eosinophils Absolute: 328 cells/uL (ref 15–500)
Eosinophils Relative: 4 %
HCT: 40.4 % (ref 35.0–45.0)
HEMOGLOBIN: 12.5 g/dL (ref 11.7–15.5)
Lymphocytes Relative: 29 %
Lymphs Abs: 2378 cells/uL (ref 850–3900)
MCH: 20 pg — ABNORMAL LOW (ref 27.0–33.0)
MCHC: 30.9 g/dL — ABNORMAL LOW (ref 32.0–36.0)
MCV: 64.6 fL — ABNORMAL LOW (ref 80.0–100.0)
MPV: 10 fL (ref 7.5–12.5)
Monocytes Absolute: 656 cells/uL (ref 200–950)
Monocytes Relative: 8 %
NEUTROS ABS: 4756 {cells}/uL (ref 1500–7800)
Neutrophils Relative %: 58 %
Platelets: 242 10*3/uL (ref 140–400)
RBC: 6.25 MIL/uL — AB (ref 3.80–5.10)
RDW: 15 % (ref 11.0–15.0)
WBC: 8.2 10*3/uL (ref 3.8–10.8)

## 2016-01-22 LAB — COMPLETE METABOLIC PANEL WITH GFR
ALBUMIN: 4.4 g/dL (ref 3.6–5.1)
ALK PHOS: 80 U/L (ref 33–130)
ALT: 25 U/L (ref 6–29)
AST: 25 U/L (ref 10–35)
BILIRUBIN TOTAL: 0.6 mg/dL (ref 0.2–1.2)
BUN: 15 mg/dL (ref 7–25)
CO2: 24 mmol/L (ref 20–31)
Calcium: 10 mg/dL (ref 8.6–10.4)
Chloride: 107 mmol/L (ref 98–110)
Creat: 0.7 mg/dL (ref 0.50–1.05)
GFR, Est African American: >89 mL/min (ref >=60)
GFR, Est Non African American: >89 mL/min (ref >=60)
GLUCOSE: 84 mg/dL (ref 65–99)
Potassium: 4.2 mmol/L (ref 3.5–5.3)
Sodium: 139 mmol/L (ref 135–146)
TOTAL PROTEIN: 7.1 g/dL (ref 6.1–8.1)

## 2016-01-22 MED ORDER — METHOCARBAMOL 500 MG PO TABS: ORAL_TABLET | ORAL | 4 refills | Status: DC

## 2016-01-22 MED ORDER — CELECOXIB 200 MG PO CAPS: ORAL_CAPSULE | ORAL | 4 refills | Status: DC

## 2016-01-22 NOTE — Progress Notes (Signed)
Orders placed for CBC CMP for her / standing orders due.

## 2016-01-22 NOTE — Progress Notes (Signed)
Office Visit Note  Patient: Shannon Jennings             Date of Birth: August 27, 1960           MRN: [id number]             PCP: Delanna Notice, MD Referring: Delanna Notice, MD Visit Date: 01/22/2016 Occupation: @GUAROCC @    Subjective:  Pain bilateral knee joints   History of Present Illness: Shannon Jennings is a 56 y.o. female with history of fibromyalgia and osteoarthritis. She continues to have pain and discomfort in her bilateral knee joints. Hand pain is tolerable. She rates her pain from fibromyalgia on a scale of 0-10 about 5. And fatigue about 8. Her pain level without medication is 9-10. She believes that the combination of tramadol and Robaxin brings the pain down. She ran out of her Celebrex about 2 weeks ago and having increased pain and swelling in her right knee joint.  Activities of Daily Living:  Patient reports morning stiffness for 1 hour.   Patient Reports nocturnal pain.  Difficulty dressing/grooming: Denies Difficulty climbing stairs: Reports Difficulty getting out of chair: Reports Difficulty using hands for taps, buttons, cutlery, and/or writing: Denies   Review of Systems  Constitutional: Positive for fatigue and weakness. Negative for night sweats, weight gain and weight loss.  HENT: Negative for mouth sores, trouble swallowing, trouble swallowing, mouth dryness and nose dryness.   Eyes: Negative for pain, redness, visual disturbance and dryness.  Respiratory: Negative for cough, shortness of breath and difficulty breathing.   Cardiovascular: Negative for chest pain, palpitations, hypertension, irregular heartbeat and swelling in legs/feet.  Gastrointestinal: Negative for blood in stool, constipation and diarrhea.  Endocrine: Negative for increased urination.  Genitourinary: Negative for vaginal dryness.  Musculoskeletal: Positive for arthralgias, joint pain, myalgias, morning stiffness and myalgias. Negative for joint swelling, muscle weakness and muscle  tenderness.  Skin: Negative for color change, rash, hair loss, skin tightness, ulcers and sensitivity to sunlight.  Allergic/Immunologic: Negative for susceptible to infections.  Neurological: Negative for dizziness, memory loss and night sweats.  Hematological: Negative for swollen glands.  Psychiatric/Behavioral: Positive for sleep disturbance. Negative for depressed mood. The patient is not nervous/anxious.     PMFS History:  Patient Active Problem List   Diagnosis Date Noted  . Fibromyalgia 01/22/2016  . Primary osteoarthritis of both hands 01/22/2016  . Primary osteoarthritis of both knees 01/22/2016  . Essential hypertension 01/22/2016  . History of coronary artery disease 01/22/2016  . History of Graves' disease 01/22/2016  . Dyslipidemia 01/22/2016  . History of thalassemia minor 01/22/2016    Past Medical History:  Diagnosis Date  . Abnormally small mouth   . Coronary artery disease   . 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 Difficult intubation 07/2012  . Fibromyalgia   . High cholesterol   . History of Graves' disease   . History of migraine   . Hypertension    under control with med., has been on med. x 4 yr.  . Hypothyroidism   . Osteoarthritis   . S/P coronary artery stent placement 04/2011   states right coronary artery  . Thalassemia minor     No family history on file. Past Surgical History:  Procedure Laterality Date  . ABDOMINAL HERNIA REPAIR  04/2012  . ABDOMINAL SURGERY  01/2011   for intestinal blockage  . CARDIAC CATHETERIZATION    . CHOLECYSTECTOMY  07/2012  . CORONARY ANGIOPLASTY  04/2011   stent x 1  . DISTAL INTERPHALANGEAL JOINT FUSION Bilateral 12/25/2012   Procedure: DISTAL INTERPHALANGEAL JOINT FUSION RIGHT AND LEFT SMALL FINGER;  Surgeon: Cammie Sickle., MD;  Location: [place] [facility];  Service: Orthopedics;  Laterality: Bilateral;  left and right small finger  . HAND SURGERY     Social  History   Social History Narrative  . No narrative on file     Objective: Vital Signs: BP 125/74 (BP Location: Left Arm, Patient Position: Sitting, Cuff Size: Normal)   Pulse 74   Ht 5\' 3"  (1.6 m)   Wt 149 lb (67.6 kg)   BMI 26.39 kg/m    Physical Exam  Constitutional: She is oriented to person, place, and time. She appears well-developed and well-nourished.  HENT:  Head: Normocephalic and atraumatic.  Eyes: Conjunctivae and EOM are normal.  Neck: Normal range of motion.  Cardiovascular: Normal rate, regular rhythm, normal heart sounds and intact distal pulses.   Pulmonary/Chest: Effort normal and breath sounds normal.  Abdominal: Soft. Bowel sounds are normal.  Lymphadenopathy:    She has no cervical adenopathy.  Neurological: She is alert and oriented to person, place, and time.  Skin: Skin is warm and dry. Capillary refill takes less than 2 seconds.  Psychiatric: She has a normal mood and affect. Her behavior is normal.  Nursing note and vitals reviewed.    Musculoskeletal Exam: C-spine and thoracic lumbar spine good range of motion. Shoulder joints elbow joints good range of motion wrist joint MCPs joints good range of motion she has thickening of bilateral DIP joints of her hands consistent with osteoarthritis. Hip joints were good range of motion with no discomfort she had painful range of motion of her right knee joint which had a small effusion and some warmth.  CDAI Exam: No CDAI exam completed.    Investigation: Findings:  08/18/2015 urine drug screen negative    Imaging: No results found.  Speciality Comments: No specialty comments available.    Procedures:  No procedures performed Allergies: Penicillins and Sulfa antibiotics   Assessment / Plan:     Visit Diagnoses: Fibromyalgia: She has generalized pain and discomfort and positive tender points. Her pain is manageable with the combination of tramadol and Robaxin. I will refill her Robaxin  today.  Primary osteoarthritis of both hands: She has DIP thickening of bilateral hands consistent with osteoarthritis  Primary osteoarthritis of both knees: She has some discomfort in her bilateral knee joints with recent increased pain in her right knee with small effusion. I offered aspiration and injection but she declined. She is thinking she may consider totally replacement in future I've advised her to schedule an appointment with Dr. Durward Fortes. She ran out of Celebrex recently and is having increased pain in her right knee joint. Side effects were reviewed. Prescription for Celebrex 200 mg by mouth daily 90 day supply with one refill will be given.  Essential hypertension  History of coronary artery disease - status post stent placement  History of Graves' disease  Dyslipidemia  History of thalassemia minor  Medication monitoring encounter : She is on tramadol for pain management. Will check urine drug screen, CBC, CMP today  Her other medical problems include history of right fourth finger enchondroma resected, bilateral fifth DIP fusion, right CMC surgery 2016, left second DIP fusion, right third and fourth trigger finger release 2016, history of intestinal blockage, history of cholecystectomy. Orders: No orders of the defined types were placed in this  encounter.  No orders of the defined types were placed in this encounter.   Face-to-face time spent with patient was 30 minutes. 50% of time was spent in counseling and coordination of care.  Follow-Up Instructions: Return in about 6 months (around 07/21/2016) for Fibromyalgia and osteoarthritis.   Bo Merino, MD  Note - This record has been created using [employer].  Chart creation errors have been sought, but may not always  have been located. Such creation errors do not reflect on  the standard of medical care.

## 2016-01-25 ENCOUNTER — Telehealth: Payer: Self-pay | Admitting: Radiology

## 2016-01-25 ENCOUNTER — Other Ambulatory Visit (INDEPENDENT_AMBULATORY_CARE_PROVIDER_SITE_OTHER): Payer: Self-pay

## 2016-01-25 LAB — PAIN MGMT, PROFILE 5 W/CONF, U

## 2016-01-25 NOTE — Telephone Encounter (Signed)
90 d Rx was sent Sat but pharmacy requested again today/ I have called pharmacy to advise to cancel the Rx sent today, keep the 90 with 1 RF on file, they state they will do this.

## 2016-01-25 NOTE — Progress Notes (Signed)
C/w

## 2016-01-26 ENCOUNTER — Other Ambulatory Visit: Payer: Self-pay | Admitting: Rheumatology

## 2016-01-26 DIAGNOSIS — Z5181 Encounter for therapeutic drug level monitoring: Secondary | ICD-10-CM

## 2016-01-26 NOTE — Progress Notes (Signed)
Have put in another order for the UDS as patients specimen was misplaced between here and the lab/ it was sent.

## 2016-02-01 ENCOUNTER — Ambulatory Visit (INDEPENDENT_AMBULATORY_CARE_PROVIDER_SITE_OTHER): Payer: BLUE CROSS/BLUE SHIELD | Admitting: Orthopaedic Surgery

## 2016-02-01 ENCOUNTER — Ambulatory Visit (INDEPENDENT_AMBULATORY_CARE_PROVIDER_SITE_OTHER): Payer: BLUE CROSS/BLUE SHIELD

## 2016-02-01 ENCOUNTER — Encounter (INDEPENDENT_AMBULATORY_CARE_PROVIDER_SITE_OTHER): Payer: Self-pay | Admitting: Orthopaedic Surgery

## 2016-02-01 ENCOUNTER — Ambulatory Visit (INDEPENDENT_AMBULATORY_CARE_PROVIDER_SITE_OTHER): Payer: Self-pay

## 2016-02-01 ENCOUNTER — Other Ambulatory Visit: Payer: Self-pay | Admitting: *Deleted

## 2016-02-01 VITALS — BP 134/78 | HR 72 | Resp 14 | Ht 63.0 in | Wt 149.0 lb

## 2016-02-01 DIAGNOSIS — M25552 Pain in left hip: Secondary | ICD-10-CM | POA: Diagnosis not present

## 2016-02-01 DIAGNOSIS — Z5181 Encounter for therapeutic drug level monitoring: Secondary | ICD-10-CM

## 2016-02-01 DIAGNOSIS — M1712 Unilateral primary osteoarthritis, left knee: Secondary | ICD-10-CM | POA: Diagnosis not present

## 2016-02-01 DIAGNOSIS — M170 Bilateral primary osteoarthritis of knee: Secondary | ICD-10-CM | POA: Diagnosis not present

## 2016-02-01 DIAGNOSIS — M25551 Pain in right hip: Secondary | ICD-10-CM

## 2016-02-01 DIAGNOSIS — M1711 Unilateral primary osteoarthritis, right knee: Secondary | ICD-10-CM

## 2016-02-01 NOTE — Progress Notes (Deleted)
Office Visit Note   Patient: Shannon Jennings           Date of Birth: Aug 28, 1960           MRN: [id number] Visit Date: 02/01/2016              Requested by: Shannon Notice, [place] [address] PCP: Shannon Notice, MD   Assessment & Plan: Visit Diagnoses:  1. Bilateral primary osteoarthritis of knee   2. Hip pain, bilateral     Plan: ***  Follow-Up Instructions: No Follow-up on file.   Orders:  Orders Placed This Encounter  Procedures  . XR KNEE 3 VIEW LEFT  . XR KNEE 3 VIEW RIGHT  . XR HIPS BILAT W OR W/O PELVIS 3-4 VIEWS  . MR Knee Right w/o contrast   No orders of the defined types were placed in this encounter.     Procedures: No procedures performed   Clinical Data: No additional findings.   Subjective: Chief Complaint  Patient presents with  . Left Knee - Pain, Edema  . Right Knee - Edema, Pain    Pt presents with history of fibromyalgia and osteoarthritis. She continues to have pain and discomfort in her bilateral knee joints. Pt is also a Dr D patient, wears shields brace to work and be able to move. Pt works at [employer].    Review of Systems   Objective: Vital Signs: BP 134/78   Pulse 72   Resp 14   Ht 5\' 3"  (1.6 m)   Wt 149 lb (67.6 kg)   BMI 26.39 kg/m   Physical Exam  Ortho Exam  Specialty Comments:  No specialty comments available.  Imaging: No results found.   PMFS History: Patient Active Problem List   Diagnosis Date Noted  . Fibromyalgia 01/22/2016  . Primary osteoarthritis of both hands 01/22/2016  . Primary osteoarthritis of both knees 01/22/2016  . Essential hypertension 01/22/2016  . History of coronary artery disease 01/22/2016  . History of Graves' disease 01/22/2016  . Dyslipidemia 01/22/2016  . History of thalassemia minor 01/22/2016   Past Medical History:  Diagnosis Date  . Abnormally small mouth   . Coronary artery disease   . 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 Difficult intubation 07/2012  . Fibromyalgia   . High cholesterol   . History of Graves' disease   . History of migraine   . Hypertension    under control with med., has been on med. x 4 yr.  . Hypothyroidism   . Osteoarthritis   . S/P coronary artery stent placement 04/2011   states right coronary artery  . Thalassemia minor     Family History  Problem Relation Age of Onset  . Cancer Sister     Past Surgical History:  Procedure Laterality Date  . ABDOMINAL HERNIA REPAIR  04/2012  . ABDOMINAL SURGERY  01/2011   for intestinal blockage  . CARDIAC CATHETERIZATION    . CHOLECYSTECTOMY  07/2012  . CORONARY ANGIOPLASTY  04/2011   stent x 1  . DISTAL INTERPHALANGEAL JOINT FUSION Bilateral 12/25/2012   Procedure: DISTAL INTERPHALANGEAL JOINT FUSION RIGHT AND LEFT SMALL FINGER;  Surgeon: Cammie Sickle., MD;  Location: [facility];  Service: Orthopedics;  Laterality: Bilateral;  left and right small finger  . HAND SURGERY     Social History   Occupational History  . Not on file.  Social History Main Topics  . Smoking status: Never Smoker  . Smokeless tobacco: Never Used  . Alcohol use No  . Drug use: No  . Sexual activity: Not on file

## 2016-02-02 ENCOUNTER — Telehealth (INDEPENDENT_AMBULATORY_CARE_PROVIDER_SITE_OTHER): Payer: Self-pay | Admitting: Orthopaedic Surgery

## 2016-02-02 NOTE — Telephone Encounter (Signed)
Patient called and says she does not think she can have an MRI today due to having pins in her hands. Please advise.

## 2016-02-02 NOTE — Telephone Encounter (Signed)
Do not think that is a problem-pt can check with radiology when she arrives

## 2016-02-02 NOTE — Telephone Encounter (Signed)
Called and Left message for patient.

## 2016-02-03 LAB — PAIN MGMT, PROFILE 5 W/CONF, U
Amphetamines: NEGATIVE ng/mL (ref <500)
BARBITURATES: NEGATIVE ng/mL (ref <300)
Benzodiazepines: NEGATIVE ng/mL (ref <100)
CODEINE: NEGATIVE ng/mL (ref <50)
Cocaine Metabolite: NEGATIVE ng/mL (ref <150)
Creatinine: 105.2 mg/dL (ref >=20.0)
HYDROCODONE: NEGATIVE ng/mL (ref <50)
Hydromorphone: NEGATIVE ng/mL (ref <50)
MARIJUANA METABOLITE: NEGATIVE ng/mL (ref <20)
METHADONE METABOLITE: NEGATIVE ng/mL (ref <100)
MORPHINE: NEGATIVE ng/mL (ref <50)
Norhydrocodone: NEGATIVE ng/mL (ref <50)
OPIATES: NEGATIVE ng/mL (ref <100)
OXIDANT: NEGATIVE ug/mL (ref <200)
OXYCODONE: NEGATIVE ng/mL (ref <100)
pH: 7.43 (ref 4.5–9.0)

## 2016-02-03 NOTE — Progress Notes (Signed)
UDS consistent

## 2016-02-08 ENCOUNTER — Other Ambulatory Visit: Payer: Self-pay | Admitting: Rheumatology

## 2016-02-08 ENCOUNTER — Telehealth (INDEPENDENT_AMBULATORY_CARE_PROVIDER_SITE_OTHER): Payer: Self-pay | Admitting: Orthopaedic Surgery

## 2016-02-08 NOTE — Telephone Encounter (Signed)
Last Visit: 01/22/16 Next Visit: 07/21/16 UDS: 02/01/16 Narc Agreement: 08/20/15  Okay to refill Tramadol?  

## 2016-02-08 NOTE — Telephone Encounter (Signed)
Anderson Malta from [facility] hospital called about MRI orders for patient (she is scheduled to have MRI tomorrow). She had left Sabrina a voicemail but has not heard anything back. Please call Anderson Malta back at [phone] ext [phone]

## 2016-02-09 ENCOUNTER — Encounter: Payer: Self-pay | Admitting: Orthopaedic Surgery

## 2016-02-09 NOTE — Telephone Encounter (Signed)
Spoke with Shannon Jennings at [facility] to advise her that I have sent couple of faxes already to [facility] for MRI and Shannon Jennings stated they received them a couple times already, and the last one was in the fax this morning.

## 2016-02-09 NOTE — Telephone Encounter (Signed)
Thank you :)

## 2016-02-10 NOTE — Telephone Encounter (Signed)
Prescription faxed to pharmacy.

## 2016-02-10 NOTE — Telephone Encounter (Signed)
Please resend pt's tramadol to [facility], they didn't recieve

## 2016-02-10 NOTE — Telephone Encounter (Signed)
Patient advised prescription sent to the pharmacy.  

## 2016-02-15 ENCOUNTER — Encounter (INDEPENDENT_AMBULATORY_CARE_PROVIDER_SITE_OTHER): Payer: Self-pay | Admitting: Orthopaedic Surgery

## 2016-02-15 ENCOUNTER — Ambulatory Visit (INDEPENDENT_AMBULATORY_CARE_PROVIDER_SITE_OTHER): Payer: BLUE CROSS/BLUE SHIELD | Admitting: Orthopaedic Surgery

## 2016-02-15 VITALS — BP 126/79 | HR 78 | Resp 12 | Ht 63.0 in | Wt 155.0 lb

## 2016-02-15 DIAGNOSIS — M25561 Pain in right knee: Secondary | ICD-10-CM

## 2016-02-15 DIAGNOSIS — G8929 Other chronic pain: Secondary | ICD-10-CM | POA: Diagnosis not present

## 2016-02-15 NOTE — Progress Notes (Signed)
Office Visit Note   Patient: Shannon Jennings           Date of Birth: Mar 03, 1960           MRN: [id number] Visit Date: 02/15/2016              Requested by: Delanna Notice, [place] [address] PCP: Delanna Notice, MD   Assessment & Plan: Visit Diagnoses: Refractory osteoarthritis right knee   Plan: We will schedule right total knee replacement. [name] had multiple cortisone and Visco supplementation injections in her right knee without much permanent relief of her pain. Has significant osteoarthritis and a least 2 compartments of her right knee per MRI scan below. Long discussion today regarding what she can expect from a knee replacement. I'm concerned about her postoperative course with her history of fibromyalgia. We discussed surgery, scar, rehabilitation, time out of work and limitation of activities. She like to proceed. Clearance form for Dr Bennett Scrape  Follow-Up Instructions: No Follow-up on file.   Orders:  No orders of the defined types were placed in this encounter.  No orders of the defined types were placed in this encounter.     Procedures: No procedures performed   Clinical Data: No additional findings.   Subjective: Chief Complaint  Patient presents with  . Right Knee - Follow-up, Pain    MRI results  Osteo arthrosis involving patellofemoral and medial compartment right knee-skin CT scan results for details  Review of Systems   Objective: Vital Signs: There were no vitals taken for this visit.  Physical Exam  Ortho Exam right knee without effusion. Predominantly along the medial compartment diffusely. Some pain with patella crepitation and compression. No pain laterally. Full extension over 110 of flexion per the goniometer. No instability. No calf pain. No popliteal discomfort. Good pulses distally. Neurologically intact. Grays negative. Painless range of motion of both hips.  Specialty Comments:  No specialty comments  available.  Imaging: No results found.   PMFS History: Patient Active Problem List   Diagnosis Date Noted  . Fibromyalgia 01/22/2016  . Primary osteoarthritis of both hands 01/22/2016  . Primary osteoarthritis of both knees 01/22/2016  . Essential hypertension 01/22/2016  . History of coronary artery disease 01/22/2016  . History of Graves' disease 01/22/2016  . Dyslipidemia 01/22/2016  . History of thalassemia minor 01/22/2016   Past Medical History:  Diagnosis Date  . Abnormally small mouth   . Coronary artery disease   . 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 Difficult intubation 07/2012  . Fibromyalgia   . High cholesterol   . History of Graves' disease   . History of migraine   . Hypertension    under control with med., has been on med. x 4 yr.  . Hypothyroidism   . Osteoarthritis   . S/P coronary artery stent placement 04/2011   states right coronary artery  . Thalassemia minor     Family History  Problem Relation Age of Onset  . Cancer Sister     Past Surgical History:  Procedure Laterality Date  . ABDOMINAL HERNIA REPAIR  04/2012  . ABDOMINAL SURGERY  01/2011   for intestinal blockage  . CARDIAC CATHETERIZATION    . CHOLECYSTECTOMY  07/2012  . CORONARY ANGIOPLASTY  04/2011   stent x 1  . DISTAL INTERPHALANGEAL JOINT FUSION Bilateral 12/25/2012   Procedure: DISTAL INTERPHALANGEAL JOINT FUSION RIGHT AND LEFT SMALL FINGER;  Surgeon: Herbie Baltimore  Christena Flake., MD;  Location: [place];  Service: Orthopedics;  Laterality: Bilateral;  left and right small finger  . HAND SURGERY     Social History   Occupational History  . Not on file.   Social History Main Topics  . Smoking status: Never Smoker  . Smokeless tobacco: Never Used  . Alcohol use No  . Drug use: No  . Sexual activity: Not on file

## 2016-02-17 NOTE — Progress Notes (Signed)
Office Visit Note   Patient: Shannon Jennings           Date of Birth: 02-09-1960           MRN: [id number] Visit Date: 02/01/2016              Requested by: Delanna Notice, [place] [address] PCP: Delanna Notice, MD   Assessment & Plan: Visit Diagnoses:  1. Bilateral primary osteoarthritis of knee   2. Hip pain, bilateral     Plan:  Follow-Up Instructions: No Follow-up on file.   Orders:  Orders Placed This Encounter  Procedures  . XR KNEE 3 VIEW LEFT  . XR KNEE 3 VIEW RIGHT  . XR HIPS BILAT W OR W/O PELVIS 3-4 VIEWS  . MR Knee Right w/o contrast   No orders of the defined types were placed in this encounter.     Procedures: No procedures performed   Clinical Data: No additional findings.   Subjective: Chief Complaint  Patient presents with  . Left Knee - Pain, Edema  . Right Knee - Edema, Pain    Pt presents with history of fibromyalgia and osteoarthritis. She continues to have pain and discomfort in her bilateral knee joints. Pt is also a Dr D patient, wears shields brace to work and be able to move. Pt works at [employer].    Review of Systems   Objective: Vital Signs: BP 134/78   Pulse 72   Resp 14   Ht 5\' 3"  (1.6 m)   Wt 149 lb (67.6 kg)   BMI 26.39 kg/m   Physical Exam  Ortho Exam  Specialty Comments:  No specialty comments available.  Imaging: No results found.   PMFS History: Patient Active Problem List   Diagnosis Date Noted  . Fibromyalgia 01/22/2016  . Primary osteoarthritis of both hands 01/22/2016  . Primary osteoarthritis of both knees 01/22/2016  . Essential hypertension 01/22/2016  . History of coronary artery disease 01/22/2016  . History of Graves' disease 01/22/2016  . Dyslipidemia 01/22/2016  . History of thalassemia minor 01/22/2016   Past Medical History:  Diagnosis Date  . Abnormally small mouth   . Coronary artery disease   . Degenerative joint disease of hand 12/2012   right  and left small fingers  . Difficult intubation 07/2012  . Fibromyalgia   . High cholesterol   . History of Graves' disease   . History of migraine   . Hypertension    under control with med., has been on med. x 4 yr.  . Hypothyroidism   . Osteoarthritis   . S/P coronary artery stent placement 04/2011   states right coronary artery  . Thalassemia minor     Family History  Problem Relation Age of Onset  . Cancer Sister     Past Surgical History:  Procedure Laterality Date  . ABDOMINAL HERNIA REPAIR  04/2012  . ABDOMINAL SURGERY  01/2011   for intestinal blockage  . CARDIAC CATHETERIZATION    . CHOLECYSTECTOMY  07/2012  . CORONARY ANGIOPLASTY  04/2011   stent x 1  . DISTAL INTERPHALANGEAL JOINT FUSION Bilateral 12/25/2012   Procedure: DISTAL INTERPHALANGEAL JOINT FUSION RIGHT AND LEFT SMALL FINGER;  Surgeon: Cammie Sickle., MD;  Location: [place];  Service: Orthopedics;  Laterality: Bilateral;  left and right small finger  . HAND SURGERY     Social History   Occupational History  . Not on file.  Social History Main Topics  . Smoking status: Never Smoker  . Smokeless tobacco: Never Used  . Alcohol use No  . Drug use: No  . Sexual activity: Not on file

## 2016-02-22 ENCOUNTER — Ambulatory Visit: Payer: BLUE CROSS/BLUE SHIELD | Admitting: Rheumatology

## 2016-02-26 ENCOUNTER — Ambulatory Visit: Payer: BLUE CROSS/BLUE SHIELD | Admitting: Rheumatology

## 2016-03-07 ENCOUNTER — Other Ambulatory Visit: Payer: Self-pay | Admitting: Rheumatology

## 2016-03-07 NOTE — Telephone Encounter (Signed)
ok 

## 2016-03-07 NOTE — Telephone Encounter (Signed)
Last Visit: 01/22/16 Next Visit: 07/21/16  Okay to refill Cyclobenzaprine?

## 2016-03-16 ENCOUNTER — Ambulatory Visit (INDEPENDENT_AMBULATORY_CARE_PROVIDER_SITE_OTHER): Payer: BLUE CROSS/BLUE SHIELD | Admitting: Orthopedic Surgery

## 2016-03-16 ENCOUNTER — Encounter (INDEPENDENT_AMBULATORY_CARE_PROVIDER_SITE_OTHER): Payer: Self-pay | Admitting: Orthopedic Surgery

## 2016-03-16 VITALS — BP 124/74 | HR 80 | Temp 98.2°F | Resp 14 | Ht 63.0 in | Wt 151.0 lb

## 2016-03-16 DIAGNOSIS — Z01818 Encounter for other preprocedural examination: Secondary | ICD-10-CM

## 2016-03-16 DIAGNOSIS — M1711 Unilateral primary osteoarthritis, right knee: Secondary | ICD-10-CM

## 2016-03-16 NOTE — Progress Notes (Deleted)
   Office Visit Note  Patient: Shannon Jennings             Date of Birth: Nov 21, 1960           MRN: [id number]             PCP: Delanna Notice, MD Visit Date: 03/16/2016   Assessment: Visit Diagnoses: Primary osteoarthritis of right knee  Pre-op evaluation   Follow-Up Instructions: No Follow-up on file.  Orders: No orders of the defined types were placed in this encounter.  No orders of the defined types were placed in this encounter.    Subjective:    Allergies: Penicillins and Sulfa antibiotics   Activities of Daily Living: ***   History of Present Illness: Shannon Jennings is a 56 y.o. female ***   No Rheumatology ROS completed.    Investigation: No additional findings.   Objective: Vital Signs: There were no vitals taken for this visit.   Physical Exam   Musculoskeletal Exam: ***  CDAI Exam: No CDAI exam completed.  CDAI comments:  Speciality Comments: No specialty comments available.  Imaging: No results found.   PMFS History:  Patient Active Problem List   Diagnosis Date Noted  . Primary osteoarthritis of right knee 03/16/2016  . Pre-op evaluation 03/16/2016  . Fibromyalgia 01/22/2016  . Primary osteoarthritis of both hands 01/22/2016  . Primary osteoarthritis of both knees 01/22/2016  . Essential hypertension 01/22/2016  . History of coronary artery disease 01/22/2016  . History of Graves' disease 01/22/2016  . Dyslipidemia 01/22/2016  . History of thalassemia minor 01/22/2016    Past Medical History:  Diagnosis Date  . Abnormally small mouth   . Coronary artery disease   . 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 Difficult intubation 07/2012  . Fibromyalgia   . High cholesterol   . History of Graves' disease   . History of migraine   . Hypertension    under control with med., has been on med. x 4 yr.  . Hypothyroidism   . Osteoarthritis   . S/P coronary artery stent placement 04/2011   states right  coronary artery  . Thalassemia minor     Family History  Problem Relation Age of Onset  . Cancer Sister    Past Surgical History:  Procedure Laterality Date  . ABDOMINAL HERNIA REPAIR  04/2012  . ABDOMINAL SURGERY  01/2011   for intestinal blockage  . CARDIAC CATHETERIZATION    . CHOLECYSTECTOMY  07/2012  . CORONARY ANGIOPLASTY  04/2011   stent x 1  . DISTAL INTERPHALANGEAL JOINT FUSION Bilateral 12/25/2012   Procedure: DISTAL INTERPHALANGEAL JOINT FUSION RIGHT AND LEFT SMALL FINGER;  Surgeon: Cammie Sickle., MD;  Location: [place];  Service: Orthopedics;  Laterality: Bilateral;  left and right small finger  . HAND SURGERY     Social History   Social History Narrative  . No narrative on file     Procedures:  No procedures performed  Amy Littrell, RT  Note - This record has been created using [employer]. Chart creation errors have been sought, but may not always have been located. Such creation errors do not reflect on the standard of medical care.

## 2016-03-16 NOTE — Progress Notes (Signed)
TOTAL KNEE ADMISSION H&P  Patient is being admitted for right total knee arthroplasty.  Subjective:  Chief Complaint:right knee pain.  HPI: Shannon Jennings, 56 y.o. female, has a history of pain and functional disability in the right knee due to arthritis and has failed non-surgical conservative treatments for greater than 12 weeks to includeNSAID's and/or analgesics, corticosteriod injections, viscosupplementation injections, flexibility and strengthening excercises, weight reduction as appropriate and activity modification.  Onset of symptoms was gradual, starting >10 years ago with gradually worsening course since that time. The patient noted no past surgery on the right knee(s).  Patient currently rates pain in the right knee(s) at 8 out of 10 with activity. Patient has night pain, worsening of pain with activity and weight bearing, pain that interferes with activities of daily living, pain with passive range of motion, crepitus and joint swelling.  Patient has evidence of subchondral sclerosis, periarticular osteophytes and joint space narrowing by imaging studies.  There is no active infection.  Patient Active Problem List   Diagnosis Date Noted  . Primary osteoarthritis of right knee 03/16/2016  . Pre-op evaluation 03/16/2016  . Fibromyalgia 01/22/2016  . Primary osteoarthritis of both hands 01/22/2016  . Primary osteoarthritis of both knees 01/22/2016  . Essential hypertension 01/22/2016  . History of coronary artery disease 01/22/2016  . History of Graves' disease 01/22/2016  . Dyslipidemia 01/22/2016  . History of thalassemia minor 01/22/2016   Past Medical History:  Diagnosis Date  . Abnormally small mouth   . Coronary artery disease   . 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 Difficult intubation 07/2012  . Fibromyalgia   . High cholesterol   . History of Graves' disease   . History of migraine   . Hypertension    under control with  med., has been on med. x 4 yr.  . Hypothyroidism   . Osteoarthritis   . S/P coronary artery stent placement 04/2011   states right coronary artery  . Thalassemia minor     Past Surgical History:  Procedure Laterality Date  . ABDOMINAL HERNIA REPAIR  04/2012  . ABDOMINAL SURGERY  01/2011   for intestinal blockage  . CARDIAC CATHETERIZATION    . CHOLECYSTECTOMY  07/2012  . CORONARY ANGIOPLASTY  04/2011   stent x 1  . DISTAL INTERPHALANGEAL JOINT FUSION Bilateral 12/25/2012   Procedure: DISTAL INTERPHALANGEAL JOINT FUSION RIGHT AND LEFT SMALL FINGER;  Surgeon: Cammie Sickle., MD;  Location: [place];  Service: Orthopedics;  Laterality: Bilateral;  left and right small finger  . HAND SURGERY      No current facility-administered medications for this encounter.    Current Outpatient Prescriptions  Medication Sig Dispense Refill  . aspirin EC 81 MG tablet Take by mouth.    Marland Kitchen atenolol (TENORMIN) 50 MG tablet Take 50 mg by mouth daily.    Marland Kitchen atorvastatin (LIPITOR) 80 MG tablet Take 80 mg by mouth daily.  8  . celecoxib (CELEBREX) 200 MG capsule TAKE ONE CAPSULE BY MOUTH ONCE DAILY WITH FOOD 90 capsule 4  . cholecalciferol (VITAMIN D) 1000 UNITS tablet Take 2,000 Units by mouth daily.     . cyclobenzaprine (FLEXERIL) 10 MG tablet TAKE 1 TABLET BY MOUTH AT BEDTIME AS NEEDED 30 tablet 5  . docusate sodium (COLACE) 100 MG capsule Take 100 mg by mouth 2 (two) times daily.    . Fe Fum-FA-B Cmp-C-Zn-Mg-Mn-Cu (HEMOCYTE PLUS) 106-1 MG CAPS Take 1  capsule by mouth daily.  5  . levothyroxine (SYNTHROID, LEVOTHROID) 112 MCG tablet Take 112 mcg by mouth daily before breakfast.    . methocarbamol (ROBAXIN) 500 MG tablet TAKE 1 TABLET BY MOUTH AT 7 AM & TAKE 1 TABLET AT 2 PM 90 tablet 4  . metoprolol (LOPRESSOR) 50 MG tablet Take 50 mg by mouth 2 (two) times daily with a meal.  6  . omeprazole (PRILOSEC) 40 MG capsule Take 40 mg by mouth.    . SUMAtriptan (IMITREX) 50 MG tablet     .  traMADol (ULTRAM) 50 MG tablet TAKE 2 TABLETS BY MOUTH 3 TIMES A DAY AS NEEDED 180 tablet 1  . zolpidem (AMBIEN) 5 MG tablet Take 1 tablet (5 mg total) by mouth at bedtime as needed for sleep. 30 tablet 5     Allergies  Allergen Reactions  . Penicillins Rash  . Sulfa Antibiotics Rash    Social History  Substance Use Topics  . Smoking status: Never Smoker  . Smokeless tobacco: Never Used  . Alcohol use No    Family History  Problem Relation Age of Onset  . Cancer Sister      Review of Systems  Constitutional: Negative.   HENT: Negative.   Eyes: Negative.   Respiratory: Negative.   Cardiovascular: Negative.   Gastrointestinal: Positive for constipation.  Musculoskeletal: Positive for joint pain and myalgias.  Skin: Negative.   Neurological: Negative.   Endo/Heme/Allergies: Bruises/bleeds easily.  Psychiatric/Behavioral: Negative.     Objective:  Physical Exam  Constitutional: She is oriented to person, place, and time. She appears well-developed and well-nourished.  HENT:  Head: Normocephalic and atraumatic.  Eyes: Conjunctivae and EOM are normal. Pupils are equal, round, and reactive to light.  Neck: Neck supple.  Cardiovascular: Normal rate, regular rhythm, normal heart sounds and intact distal pulses.   Respiratory: Effort normal and breath sounds normal.  GI: Soft. Bowel sounds are normal. There is no tenderness.  Neurological: She is alert and oriented to person, place, and time.  Skin: Skin is warm and dry.  Psychiatric: She has a normal mood and affect. Her behavior is normal. Judgment and thought content normal.    Vital signs in last 24 hours: Temp:  [98.2 F (36.8 C)] 98.2 F (36.8 C) (03/14 1308) Pulse Rate:  [80] 80 (03/14 1308) Resp:  [14] 14 (03/14 1308) BP: (124)/(74) 124/74 (03/14 1308) Weight:  [151 lb (68.5 kg)] 151 lb (68.5 kg) (03/14 1308)  Labs:   Estimated body mass index is 26.75 kg/m as calculated from the following:   Height as  of 03/16/16: 5\' 3"  (1.6 m).   Weight as of 03/16/16: 151 lb (68.5 kg).   Imaging Review Plain radiographs demonstrate moderate degenerative joint disease of the right knee(s). The overall alignment isneutral. The bone quality appears to be good for age and reported activity level.  Assessment/Plan:  End stage arthritis, right knee   The patient history, physical examination, clinical judgment of the provider and imaging studies are consistent with end stage degenerative joint disease of the right knee(s) and total knee arthroplasty is deemed medically necessary. The treatment options including medical management, injection therapy arthroscopy and arthroplasty were discussed at length. The risks and benefits of total knee arthroplasty were presented and reviewed. The risks due to aseptic loosening, infection, stiffness, patella tracking problems, thromboembolic complications and other imponderables were discussed. The patient acknowledged the explanation, agreed to proceed with the plan and consent was signed. Patient is being admitted  for inpatient treatment for surgery, pain control, PT, OT, prophylactic antibiotics, VTE prophylaxis, progressive ambulation and ADL's and discharge planning. The patient is planning to be discharged home with home health services  Mike Craze. [place], [place] [phone]  03/16/2016 3:38 PM

## 2016-03-16 NOTE — H&P (Signed)
TOTAL KNEE ADMISSION H&P  Patient is being admitted for right total knee arthroplasty.  Subjective:  Chief Complaint:right knee pain.  HPI: Shannon Jennings, 56 y.o. female, has a history of pain and functional disability in the right knee due to arthritis and has failed non-surgical conservative treatments for greater than 12 weeks to includeNSAID's and/or analgesics, corticosteriod injections, viscosupplementation injections, flexibility and strengthening excercises, weight reduction as appropriate and activity modification.  Onset of symptoms was gradual, starting >10 years ago with gradually worsening course since that time. The patient noted no past surgery on the right knee(s).  Patient currently rates pain in the right knee(s) at 8 out of 10 with activity. Patient has night pain, worsening of pain with activity and weight bearing, pain that interferes with activities of daily living, pain with passive range of motion, crepitus and joint swelling.  Patient has evidence of subchondral sclerosis, periarticular osteophytes and joint space narrowing by imaging studies.  There is no active infection.  Patient Active Problem List   Diagnosis Date Noted  . Primary osteoarthritis of right knee 03/16/2016  . Pre-op evaluation 03/16/2016  . Fibromyalgia 01/22/2016  . Primary osteoarthritis of both hands 01/22/2016  . Primary osteoarthritis of both knees 01/22/2016  . Essential hypertension 01/22/2016  . History of coronary artery disease 01/22/2016  . History of Graves' disease 01/22/2016  . Dyslipidemia 01/22/2016  . History of thalassemia minor 01/22/2016   Past Medical History:  Diagnosis Date  . Abnormally small mouth   . Coronary artery disease   . 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 Difficult intubation 07/2012  . Fibromyalgia   . High cholesterol   . History of Graves' disease   . History of migraine   . Hypertension    under control with  med., has been on med. x 4 yr.  . Hypothyroidism   . Osteoarthritis   . S/P coronary artery stent placement 04/2011   states right coronary artery  . Thalassemia minor     Past Surgical History:  Procedure Laterality Date  . ABDOMINAL HERNIA REPAIR  04/2012  . ABDOMINAL SURGERY  01/2011   for intestinal blockage  . CARDIAC CATHETERIZATION    . CHOLECYSTECTOMY  07/2012  . CORONARY ANGIOPLASTY  04/2011   stent x 1  . DISTAL INTERPHALANGEAL JOINT FUSION Bilateral 12/25/2012   Procedure: DISTAL INTERPHALANGEAL JOINT FUSION RIGHT AND LEFT SMALL FINGER;  Surgeon: Cammie Sickle., MD;  Location: [facility];  Service: Orthopedics;  Laterality: Bilateral;  left and right small finger  . HAND SURGERY      No current facility-administered medications for this encounter.    Current Outpatient Prescriptions  Medication Sig Dispense Refill  . aspirin EC 81 MG tablet Take by mouth.    Marland Kitchen atenolol (TENORMIN) 50 MG tablet Take 50 mg by mouth daily.    Marland Kitchen atorvastatin (LIPITOR) 80 MG tablet Take 80 mg by mouth daily.  8  . celecoxib (CELEBREX) 200 MG capsule TAKE ONE CAPSULE BY MOUTH ONCE DAILY WITH FOOD 90 capsule 4  . cholecalciferol (VITAMIN D) 1000 UNITS tablet Take 2,000 Units by mouth daily.     . cyclobenzaprine (FLEXERIL) 10 MG tablet TAKE 1 TABLET BY MOUTH AT BEDTIME AS NEEDED 30 tablet 5  . docusate sodium (COLACE) 100 MG capsule Take 100 mg by mouth 2 (two) times daily.    . Fe Fum-FA-B Cmp-C-Zn-Mg-Mn-Cu (HEMOCYTE PLUS) 106-1 MG CAPS Take 1  capsule by mouth daily.  5  . levothyroxine (SYNTHROID, LEVOTHROID) 112 MCG tablet Take 112 mcg by mouth daily before breakfast.    . methocarbamol (ROBAXIN) 500 MG tablet TAKE 1 TABLET BY MOUTH AT 7 AM & TAKE 1 TABLET AT 2 PM 90 tablet 4  . metoprolol (LOPRESSOR) 50 MG tablet Take 50 mg by mouth 2 (two) times daily with a meal.  6  . omeprazole (PRILOSEC) 40 MG capsule Take 40 mg by mouth.    . SUMAtriptan (IMITREX) 50 MG tablet     .  traMADol (ULTRAM) 50 MG tablet TAKE 2 TABLETS BY MOUTH 3 TIMES A DAY AS NEEDED 180 tablet 1  . zolpidem (AMBIEN) 5 MG tablet Take 1 tablet (5 mg total) by mouth at bedtime as needed for sleep. 30 tablet 5     Allergies  Allergen Reactions  . Penicillins Rash  . Sulfa Antibiotics Rash    Social History  Substance Use Topics  . Smoking status: Never Smoker  . Smokeless tobacco: Never Used  . Alcohol use No    Family History  Problem Relation Age of Onset  . Cancer Sister      Review of Systems  Constitutional: Negative.   HENT: Negative.   Eyes: Negative.   Respiratory: Negative.   Cardiovascular: Negative.   Gastrointestinal: Positive for constipation.  Musculoskeletal: Positive for joint pain and myalgias.  Skin: Negative.   Neurological: Negative.   Endo/Heme/Allergies: Bruises/bleeds easily.  Psychiatric/Behavioral: Negative.     Objective:  Physical Exam  Constitutional: She is oriented to person, place, and time. She appears well-developed and well-nourished.  HENT:  Head: Normocephalic and atraumatic.  Eyes: Conjunctivae and EOM are normal. Pupils are equal, round, and reactive to light.  Neck: Neck supple.  Cardiovascular: Normal rate, regular rhythm, normal heart sounds and intact distal pulses.   Respiratory: Effort normal and breath sounds normal.  GI: Soft. Bowel sounds are normal. There is no tenderness.  Neurological: She is alert and oriented to person, place, and time.  Skin: Skin is warm and dry.  Psychiatric: She has a normal mood and affect. Her behavior is normal. Judgment and thought content normal.    Vital signs in last 24 hours: Temp:  [98.2 F (36.8 C)] 98.2 F (36.8 C) (03/14 1308) Pulse Rate:  [80] 80 (03/14 1308) Resp:  [14] 14 (03/14 1308) BP: (124)/(74) 124/74 (03/14 1308) Weight:  [151 lb (68.5 kg)] 151 lb (68.5 kg) (03/14 1308)  Labs:   Estimated body mass index is 26.75 kg/m as calculated from the following:   Height as  of 03/16/16: 5\' 3"  (1.6 m).   Weight as of 03/16/16: 151 lb (68.5 kg).   Imaging Review Plain radiographs demonstrate moderate degenerative joint disease of the right knee(s). The overall alignment isneutral. The bone quality appears to be good for age and reported activity level.  Assessment/Plan:  End stage arthritis, right knee   The patient history, physical examination, clinical judgment of the provider and imaging studies are consistent with end stage degenerative joint disease of the right knee(s) and total knee arthroplasty is deemed medically necessary. The treatment options including medical management, injection therapy arthroscopy and arthroplasty were discussed at length. The risks and benefits of total knee arthroplasty were presented and reviewed. The risks due to aseptic loosening, infection, stiffness, patella tracking problems, thromboembolic complications and other imponderables were discussed. The patient acknowledged the explanation, agreed to proceed with the plan and consent was signed. Patient is being admitted  for inpatient treatment for surgery, pain control, PT, OT, prophylactic antibiotics, VTE prophylaxis, progressive ambulation and ADL's and discharge planning. The patient is planning to be discharged home with home health services

## 2016-03-21 NOTE — Pre-Procedure Instructions (Signed)
Shannon Jennings  03/21/2016      [id number] [place] [address] Phone: [phone] Fax: [phone]    Your procedure is scheduled on March27  Report to [facility] at [place].M.  Call this number if you have problems the morning of surgery:  [phone]   Remember:  Do not eat food or drink liquids after midnight.   Take these medicines the morning of surgery with A SIP OF WATER levothyroxine (SYNTHROID, LEVOTHROID), metoprolol (LOPRESSOR) , traMADol (ULTRAM)  Stop celecoxib (CELEBREX) 7 days prior to surgery  7 days prior to surgery STOP taking any Aspirin, Aleve, Naproxen, Ibuprofen, Motrin, Advil, Goody's, BC's, all herbal medications, fish oil, and all vitamins    Do not wear jewelry, make-up or nail polish.  Do not wear lotions, powders, or perfumes, or deoderant.  Do not shave 48 hours prior to surgery.  Men may shave face and neck.  Do not bring valuables to the hospital.  [name] is not responsible for any belongings or valuables.  Contacts, dentures or bridgework may not be worn into surgery.  Leave your suitcase in the car.  After surgery it may be brought to your room.  For patients admitted to the hospital, discharge time will be determined by your treatment team.  Patients discharged the day of surgery will not be allowed to drive home.   , Special instructions:   [place]- Preparing For Surgery  Before surgery, you can play an important role. Because skin is not sterile, your skin needs to be as free of germs as possible. You can reduce the number of germs on your skin by washing with CHG (chlorahexidine gluconate) Soap before surgery.  CHG is an antiseptic cleaner which kills germs and bonds with the skin to continue killing germs even after washing.  Please do not use if you have an allergy to CHG or antibacterial soaps. If your skin  becomes reddened/irritated stop using the CHG.  Do not shave (including legs and underarms) for at least 48 hours prior to first CHG shower. It is OK to shave your face.  Please follow these instructions carefully.   1. Shower the NIGHT BEFORE SURGERY and the MORNING OF SURGERY with CHG.   2. If you chose to wash your hair, wash your hair first as usual with your normal shampoo.  3. After you shampoo, rinse your hair and body thoroughly to remove the shampoo.  4. Use CHG as you would any other liquid soap. You can apply CHG directly to the skin and wash gently with a scrungie or a clean washcloth.   5. Apply the CHG Soap to your body ONLY FROM THE NECK DOWN.  Do not use on open wounds or open sores. Avoid contact with your eyes, ears, mouth and genitals (private parts). Wash genitals (private parts) with your normal soap.  6. Wash thoroughly, paying special attention to the area where your surgery will be performed.  7. Thoroughly rinse your body with warm water from the neck down.  8. DO NOT shower/wash with your normal soap after using and rinsing off the CHG Soap.  9. Pat yourself dry with a CLEAN TOWEL.   10. Wear CLEAN PAJAMAS   11. Place CLEAN SHEETS on your bed the night of your first shower and DO NOT SLEEP WITH PETS.    Day of Surgery: Do  not apply any deodorants/lotions. Please wear clean clothes to the hospital/surgery center.      Please read over the following fact sheets that you were given.

## 2016-03-22 ENCOUNTER — Encounter (HOSPITAL_COMMUNITY)
Admission: RE | Admit: 2016-03-22 | Discharge: 2016-03-22 | Disposition: A | Discharge status: Home / Self Care | Payer: BLUE CROSS/BLUE SHIELD | Source: Ambulatory Visit | Attending: Orthopaedic Surgery | Admitting: Orthopaedic Surgery

## 2016-03-22 ENCOUNTER — Encounter (HOSPITAL_COMMUNITY)
Admission: RE | Admit: 2016-03-22 | Discharge: 2016-03-22 | Disposition: A | Discharge status: Home / Self Care | Payer: BLUE CROSS/BLUE SHIELD | Source: Ambulatory Visit | Attending: Orthopedic Surgery | Admitting: Orthopedic Surgery

## 2016-03-22 ENCOUNTER — Encounter (HOSPITAL_COMMUNITY): Payer: Self-pay

## 2016-03-22 DIAGNOSIS — I10 Essential (primary) hypertension: Secondary | ICD-10-CM | POA: Insufficient documentation

## 2016-03-22 DIAGNOSIS — I2510 Atherosclerotic heart disease of native coronary artery without angina pectoris: Secondary | ICD-10-CM | POA: Insufficient documentation

## 2016-03-22 DIAGNOSIS — E0500 Thyrotoxicosis with diffuse goiter without thyrotoxic crisis or storm: Secondary | ICD-10-CM | POA: Diagnosis not present

## 2016-03-22 DIAGNOSIS — Z01818 Encounter for other preprocedural examination: Secondary | ICD-10-CM | POA: Diagnosis present

## 2016-03-22 DIAGNOSIS — E039 Hypothyroidism, unspecified: Secondary | ICD-10-CM | POA: Diagnosis not present

## 2016-03-22 DIAGNOSIS — Z96659 Presence of unspecified artificial knee joint: Secondary | ICD-10-CM | POA: Insufficient documentation

## 2016-03-22 DIAGNOSIS — Z01812 Encounter for preprocedural laboratory examination: Secondary | ICD-10-CM | POA: Diagnosis not present

## 2016-03-22 DIAGNOSIS — E7800 Pure hypercholesterolemia, unspecified: Secondary | ICD-10-CM | POA: Diagnosis not present

## 2016-03-22 DIAGNOSIS — R918 Other nonspecific abnormal finding of lung field: Secondary | ICD-10-CM | POA: Diagnosis not present

## 2016-03-22 DIAGNOSIS — G43909 Migraine, unspecified, not intractable, without status migrainosus: Secondary | ICD-10-CM | POA: Insufficient documentation

## 2016-03-22 DIAGNOSIS — Z9049 Acquired absence of other specified parts of digestive tract: Secondary | ICD-10-CM | POA: Insufficient documentation

## 2016-03-22 DIAGNOSIS — Z955 Presence of coronary angioplasty implant and graft: Secondary | ICD-10-CM | POA: Diagnosis not present

## 2016-03-22 HISTORY — DX: Other complications of anesthesia, initial encounter: T88.59XA

## 2016-03-22 HISTORY — DX: Adverse effect of unspecified anesthetic, initial encounter: T41.45XA

## 2016-03-22 LAB — PROTIME-INR
INR: 1.09
PROTHROMBIN TIME: 14.1 s (ref 11.4–15.2)

## 2016-03-22 LAB — TYPE AND SCREEN
ABO/RH(D): O POS
Antibody Screen: NEGATIVE

## 2016-03-22 LAB — ABO/RH: ABO/RH(D): O POS

## 2016-03-22 LAB — COMPREHENSIVE METABOLIC PANEL
ALT: 37 U/L (ref 14–54)
AST: 29 U/L (ref 15–41)
Albumin: 4.4 g/dL (ref 3.5–5.0)
Alkaline Phosphatase: 113 U/L (ref 38–126)
Anion gap: 10 (ref 5–15)
BILIRUBIN TOTAL: 1 mg/dL (ref 0.3–1.2)
BUN: 13 mg/dL (ref 6–20)
CALCIUM: 9.9 mg/dL (ref 8.9–10.3)
CO2: 24 mmol/L (ref 22–32)
CREATININE: 0.69 mg/dL (ref 0.44–1.00)
Chloride: 106 mmol/L (ref 101–111)
GFR calc Af Amer: >60 mL/min (ref >60)
Glucose, Bld: 87 mg/dL (ref 65–99)
Potassium: 3.6 mmol/L (ref 3.5–5.1)
Sodium: 140 mmol/L (ref 135–145)
TOTAL PROTEIN: 7.1 g/dL (ref 6.5–8.1)

## 2016-03-22 LAB — CBC WITH DIFFERENTIAL/PLATELET
Basophils Absolute: 0.1 10*3/uL (ref 0.0–0.1)
Basophils Relative: 1 %
EOS PCT: 3 %
Eosinophils Absolute: 0.2 10*3/uL (ref 0.0–0.7)
HEMATOCRIT: 39.3 % (ref 36.0–46.0)
Hemoglobin: 12.3 g/dL (ref 12.0–15.0)
LYMPHS ABS: 2.4 10*3/uL (ref 0.7–4.0)
Lymphocytes Relative: 30 %
MCH: 20.1 pg — ABNORMAL LOW (ref 26.0–34.0)
MCHC: 31.3 g/dL (ref 30.0–36.0)
MCV: 64.2 fL — AB (ref 78.0–100.0)
MONOS PCT: 7 %
Monocytes Absolute: 0.6 10*3/uL (ref 0.1–1.0)
Neutro Abs: 4.7 10*3/uL (ref 1.7–7.7)
Neutrophils Relative %: 59 %
Platelets: 224 10*3/uL (ref 150–400)
RBC: 6.12 MIL/uL — ABNORMAL HIGH (ref 3.87–5.11)
RDW: 15 % (ref 11.5–15.5)
WBC: 8 10*3/uL (ref 4.0–10.5)

## 2016-03-22 LAB — SURGICAL PCR SCREEN
MRSA, PCR: NEGATIVE
Staphylococcus aureus: NEGATIVE

## 2016-03-22 LAB — APTT: aPTT: 35 seconds (ref 24–36)

## 2016-03-22 NOTE — Progress Notes (Signed)
Called Dr. Rudene Anda office about allergy to Penicillin with ancef ordered

## 2016-03-22 NOTE — Progress Notes (Signed)
PCP - Delanna Notice Cardiologist - [place]  Chest x-ray - 03/22/16 EKG - requesting Stress Test - requesting ECHO - requesting Cardiac Cath - requesting  Last dose of Aspirin 3/15 Needs anesthesia review - spoke with [facility]- requesting records   Patient denies shortness of breath, fever, cough and chest pain at PAT appointment   Patient verbalized understanding of instructions that was given to them at the PAT appointment. Patient expressed that there were no further questions.  Patient was also instructed that they will need to review over the PAT instructions again at home before the surgery.

## 2016-03-23 LAB — URINE CULTURE

## 2016-03-24 NOTE — Progress Notes (Signed)
Anesthesia PAT Evaluation: Patient is a 56 year old female scheduled for right TKA on 03/29/16 by Dr. Durward Fortes. Anesthesia type is posted for [place].  History includes DIFFICULT INTUBATION, never smoker, hypothyroidism, Graves' disease s/p ablation, CAD s/p RCA Promus stent 04/27/11, migraines, thalassemia minor, HTN, hypercholesterolemia, cholecystectomy, abdominal hernia repair, appendectomy.   In regards to [place], there is a "Unrecognized Difficult Intubation" form dated 07/23/12 from [facility]) scanned under the Media Tab, Correspondence, Encounter date 12/25/12. Reasons for difficulty included for cervical extension, short thyromental space, limited mouth opening, prominent teeth (overbite), anterior larynx. Ventilation was easy. Success was achieved with the patient unconscious/paralyzed using Glidescope. An alternative airway device after induction of anesthesia recommended for future anesthetics. 07/30/12 anesthesia record also obtained from [facility] that indicated on that day, patient had grade III view with MIL 2 blade, grade I view with AirTraq (guided video intubation).   PCP/Cardiologist is Dr. Delanna Notice with [facility]. He last saw her on 02/03/16 for follow-up and preoperative evaluation prior to arthroscopic knee surgery. He felt there was no modifiable risk and cleared her the that procedure. Six month follow-up planned.  Meds include aspirin 81 mg (held 03/17/16), Lipitor, levothyroxine, Hemocyte plus, Lopressor, Imitrex, Ambien, tramadol.  BP 135/75   Pulse 71   Temp 36.9 C   Resp 18   Ht 5' 3.5" (1.613 m)   Wt 154 lb 4.8 oz (70 kg)   SpO2 95%   BMI 26.90 kg/m   EKG 02/03/16 ([facility]): "No ischemia on electrocardiogram shows chronic T-wave inversion and her lateral leads." Tracing requested.  Echo 05/30/14 ([place] [facility]): Impression: Normal left ventricular systolic function, EF 77%. Normal chamber dimensions. Mild  tricuspid regurgitation.  Cardiac cath 12/12/13 [facility]): FINDINGS:  Left main is normal. This vessel bifurcates in the LAD and left circumflex. LAD has an average-sized diagonal branch with no disease seen, there are minor irregularities in the proximal LAD in the range of 10%. Left circumflex has first bifurcating obtuse marginal branch, with no significant disease. RCA has a patent stent in the proximal segment. There is 10% narrowing at the ostium of the vessel and 30% narrowing of the distal RCA. IMPRESSION: Nonobstructive epicardial coronary artery disease with patent stent in the right coronary artery. The patient will be continued on medical therapy.  CXR 03/22/16: IMPRESSION: Linear scarring or atelectasis in the right middle lobe. No radiographic evidence for acute cardiopulmonary abnormality.  Preoperative labs noted. CMET, PT/PTT WNL. H/H 12.3/39.3. PLT 224 (large platelets noted on smear). Glucose 87. T&S done. Urine culture showed multiple species present, suggest recollection.  Patient with recent cardiology evaluation with clearance for arthroscopic knee surgery then. She is now for TKA. She denied any CV symptoms. If no acute changes then I anticipate that she can proceed as planned. Patient with difficult intubation history. She does have small mouth opening, but appears to have good neck mobility. She reports upper central/lateral incisors with caps, and has a right upper molar with a crown. She is interested in spinal anesthesia. Anesthesiologist to evaluate on the day of surgery to discuss definitive anesthesia plan.   [facility] Short Stay Center/Anesthesiology Phone [phone] 03/24/2016 11:18 AM

## 2016-03-28 MED ORDER — TRANEXAMIC ACID 1000 MG/10ML IV SOLN
2000.0000 mg | INTRAVENOUS | Status: AC
  Administered 2016-03-29: 2000 mg via TOPICAL
  Filled 2016-03-28: qty 20

## 2016-03-28 MED ORDER — VANCOMYCIN HCL IN DEXTROSE 1-5 GM/200ML-% IV SOLN
1000.0000 mg | INTRAVENOUS | Status: AC
Start: 2016-03-29 — End: 2016-03-29
  Administered 2016-03-29: 1000 mg via INTRAVENOUS
  Filled 2016-03-28: qty 200

## 2016-03-28 MED ORDER — SODIUM CHLORIDE 0.9 % IV SOLN: INTRAVENOUS | Status: DC

## 2016-03-28 MED ORDER — ACETAMINOPHEN 10 MG/ML IV SOLN
1000.0000 mg | Freq: Once | INTRAVENOUS | Status: AC
  Administered 2016-03-29: 1000 mg via INTRAVENOUS
  Filled 2016-03-28: qty 100

## 2016-03-29 ENCOUNTER — Encounter (HOSPITAL_COMMUNITY): Payer: Self-pay | Admitting: *Deleted

## 2016-03-29 ENCOUNTER — Inpatient Hospital Stay (HOSPITAL_COMMUNITY): Payer: BLUE CROSS/BLUE SHIELD | Admitting: Vascular Surgery

## 2016-03-29 ENCOUNTER — Encounter (HOSPITAL_COMMUNITY)
Admission: RE | Disposition: A | Discharge status: Home Health | Payer: Self-pay | Source: Ambulatory Visit | Attending: Orthopaedic Surgery

## 2016-03-29 ENCOUNTER — Inpatient Hospital Stay (HOSPITAL_COMMUNITY): Payer: BLUE CROSS/BLUE SHIELD | Admitting: Certified Registered Nurse Anesthetist

## 2016-03-29 ENCOUNTER — Inpatient Hospital Stay (HOSPITAL_COMMUNITY)
Admission: RE | Admit: 2016-03-29 | Discharge: 2016-03-31 | DRG: 470 | Disposition: A | Discharge status: Home Health | Payer: BLUE CROSS/BLUE SHIELD | Source: Ambulatory Visit | Attending: Orthopaedic Surgery | Admitting: Orthopaedic Surgery

## 2016-03-29 DIAGNOSIS — I2510 Atherosclerotic heart disease of native coronary artery without angina pectoris: Secondary | ICD-10-CM | POA: Diagnosis present

## 2016-03-29 DIAGNOSIS — Z955 Presence of coronary angioplasty implant and graft: Secondary | ICD-10-CM

## 2016-03-29 DIAGNOSIS — Z79899 Other long term (current) drug therapy: Secondary | ICD-10-CM | POA: Diagnosis not present

## 2016-03-29 DIAGNOSIS — M797 Fibromyalgia: Secondary | ICD-10-CM | POA: Diagnosis present

## 2016-03-29 DIAGNOSIS — E785 Hyperlipidemia, unspecified: Secondary | ICD-10-CM | POA: Diagnosis present

## 2016-03-29 DIAGNOSIS — R079 Chest pain, unspecified: Secondary | ICD-10-CM | POA: Diagnosis not present

## 2016-03-29 DIAGNOSIS — D563 Thalassemia minor: Secondary | ICD-10-CM | POA: Diagnosis present

## 2016-03-29 DIAGNOSIS — K5900 Constipation, unspecified: Secondary | ICD-10-CM | POA: Diagnosis present

## 2016-03-29 DIAGNOSIS — M9420 Chondromalacia, unspecified site: Secondary | ICD-10-CM | POA: Diagnosis present

## 2016-03-29 DIAGNOSIS — Y92239 Unspecified place in hospital as the place of occurrence of the external cause: Secondary | ICD-10-CM | POA: Diagnosis not present

## 2016-03-29 DIAGNOSIS — M1711 Unilateral primary osteoarthritis, right knee: Principal | ICD-10-CM | POA: Diagnosis present

## 2016-03-29 DIAGNOSIS — E039 Hypothyroidism, unspecified: Secondary | ICD-10-CM | POA: Diagnosis present

## 2016-03-29 DIAGNOSIS — Z7982 Long term (current) use of aspirin: Secondary | ICD-10-CM

## 2016-03-29 DIAGNOSIS — Z880 Allergy status to penicillin: Secondary | ICD-10-CM

## 2016-03-29 DIAGNOSIS — I10 Essential (primary) hypertension: Secondary | ICD-10-CM | POA: Diagnosis present

## 2016-03-29 DIAGNOSIS — Z882 Allergy status to sulfonamides status: Secondary | ICD-10-CM | POA: Diagnosis not present

## 2016-03-29 DIAGNOSIS — E7800 Pure hypercholesterolemia, unspecified: Secondary | ICD-10-CM | POA: Diagnosis present

## 2016-03-29 DIAGNOSIS — T398X5A Adverse effect of other nonopioid analgesics and antipyretics, not elsewhere classified, initial encounter: Secondary | ICD-10-CM | POA: Diagnosis not present

## 2016-03-29 DIAGNOSIS — Z885 Allergy status to narcotic agent status: Secondary | ICD-10-CM

## 2016-03-29 DIAGNOSIS — M7120 Synovial cyst of popliteal space [Baker], unspecified knee: Secondary | ICD-10-CM | POA: Diagnosis present

## 2016-03-29 HISTORY — PX: TOTAL KNEE ARTHROPLASTY: SHX125

## 2016-03-29 SURGERY — ARTHROPLASTY, KNEE, TOTAL
Anesthesia: Spinal | Site: Knee | Laterality: Right

## 2016-03-29 MED ORDER — MORPHINE SULFATE (PF) 4 MG/ML IV SOLN
INTRAVENOUS | Status: AC
  Administered 2016-03-29: 2 mg via INTRAVENOUS
  Filled 2016-03-29: qty 1

## 2016-03-29 MED ORDER — METOPROLOL TARTRATE 50 MG PO TABS
50.0000 mg | ORAL_TABLET | Freq: Two times a day (BID) | ORAL | Status: DC
  Administered 2016-03-29 – 2016-03-31 (×4): 50 mg via ORAL
  Filled 2016-03-29 (×4): qty 1

## 2016-03-29 MED ORDER — PROPOFOL 10 MG/ML IV BOLUS
INTRAVENOUS | Status: DC | PRN
  Administered 2016-03-29: 30 mg via INTRAVENOUS
  Administered 2016-03-29: 20 mg via INTRAVENOUS
  Administered 2016-03-29: 30 mg via INTRAVENOUS
  Administered 2016-03-29 (×2): 20 mg via INTRAVENOUS

## 2016-03-29 MED ORDER — LEVOTHYROXINE SODIUM 112 MCG PO TABS
112.0000 ug | ORAL_TABLET | Freq: Every day | ORAL | Status: DC
  Administered 2016-03-30 – 2016-03-31 (×2): 112 ug via ORAL
  Filled 2016-03-29 (×2): qty 1

## 2016-03-29 MED ORDER — ALUM & MAG HYDROXIDE-SIMETH 200-200-20 MG/5ML PO SUSP: 30.0000 mL | ORAL | Status: DC | PRN

## 2016-03-29 MED ORDER — CHLORHEXIDINE GLUCONATE 4 % EX LIQD: 60.0000 mL | Freq: Once | CUTANEOUS | Status: DC

## 2016-03-29 MED ORDER — OXYCODONE HCL 5 MG PO TABS
ORAL_TABLET | ORAL | Status: AC
  Filled 2016-03-29: qty 2

## 2016-03-29 MED ORDER — METHOCARBAMOL 500 MG PO TABS
ORAL_TABLET | ORAL | Status: AC
  Filled 2016-03-29: qty 1

## 2016-03-29 MED ORDER — KETOROLAC TROMETHAMINE 15 MG/ML IJ SOLN
15.0000 mg | Freq: Four times a day (QID) | INTRAMUSCULAR | Status: DC
  Administered 2016-03-29: 15 mg via INTRAVENOUS
  Filled 2016-03-29 (×2): qty 1

## 2016-03-29 MED ORDER — PHENOL 1.4 % MT LIQD: 1.0000 | OROMUCOSAL | Status: DC | PRN

## 2016-03-29 MED ORDER — METHOCARBAMOL 500 MG PO TABS
500.0000 mg | ORAL_TABLET | Freq: Four times a day (QID) | ORAL | Status: DC | PRN
  Administered 2016-03-29 – 2016-03-30 (×4): 500 mg via ORAL
  Filled 2016-03-29 (×3): qty 1

## 2016-03-29 MED ORDER — ACETAMINOPHEN 10 MG/ML IV SOLN
1000.0000 mg | Freq: Four times a day (QID) | INTRAVENOUS | Status: AC
  Administered 2016-03-29 – 2016-03-30 (×4): 1000 mg via INTRAVENOUS
  Filled 2016-03-29 (×4): qty 100

## 2016-03-29 MED ORDER — EPINEPHRINE PF 1 MG/ML IJ SOLN
INTRAMUSCULAR | Status: AC
  Filled 2016-03-29: qty 1

## 2016-03-29 MED ORDER — VITAMIN D 1000 UNITS PO TABS
2000.0000 [IU] | ORAL_TABLET | Freq: Every day | ORAL | Status: DC
  Administered 2016-03-29 – 2016-03-31 (×3): 2000 [IU] via ORAL
  Filled 2016-03-29 (×3): qty 2

## 2016-03-29 MED ORDER — MAGNESIUM CITRATE PO SOLN: 1.0000 | Freq: Once | ORAL | Status: DC | PRN

## 2016-03-29 MED ORDER — GLYCOPYRROLATE 0.2 MG/ML IJ SOLN
INTRAMUSCULAR | Status: DC | PRN
  Administered 2016-03-29: 0.2 mg via INTRAVENOUS

## 2016-03-29 MED ORDER — MENTHOL 3 MG MT LOZG: 1.0000 | LOZENGE | OROMUCOSAL | Status: DC | PRN

## 2016-03-29 MED ORDER — BUPIVACAINE HCL (PF) 0.25 % IJ SOLN
INTRAMUSCULAR | Status: AC
  Filled 2016-03-29: qty 30

## 2016-03-29 MED ORDER — METOPROLOL TARTARATE 1 MG/ML SYRINGE (5ML)
Status: DC | PRN
  Administered 2016-03-29: 2.5 mg via INTRAVENOUS
  Administered 2016-03-29: 2 mg via INTRAVENOUS
  Administered 2016-03-29: 2.5 mg via INTRAVENOUS
  Administered 2016-03-29: 2 mg via INTRAVENOUS
  Administered 2016-03-29 (×2): 2.5 mg via INTRAVENOUS
  Administered 2016-03-29: 1 mg via INTRAVENOUS

## 2016-03-29 MED ORDER — DOCUSATE SODIUM 100 MG PO CAPS
100.0000 mg | ORAL_CAPSULE | Freq: Two times a day (BID) | ORAL | Status: DC
  Administered 2016-03-29 – 2016-03-31 (×4): 100 mg via ORAL
  Filled 2016-03-29 (×4): qty 1

## 2016-03-29 MED ORDER — ONDANSETRON HCL 4 MG/2ML IJ SOLN: 4.0000 mg | Freq: Four times a day (QID) | INTRAMUSCULAR | Status: DC | PRN

## 2016-03-29 MED ORDER — METHOCARBAMOL 1000 MG/10ML IJ SOLN
500.0000 mg | Freq: Four times a day (QID) | INTRAVENOUS | Status: DC | PRN
  Filled 2016-03-29: qty 5

## 2016-03-29 MED ORDER — POLYETHYLENE GLYCOL 3350 17 G PO PACK
17.0000 g | PACK | Freq: Every day | ORAL | Status: DC | PRN
  Administered 2016-03-31: 17 g via ORAL
  Filled 2016-03-29: qty 1

## 2016-03-29 MED ORDER — DIPHENHYDRAMINE HCL 12.5 MG/5ML PO ELIX
12.5000 mg | ORAL_SOLUTION | ORAL | Status: DC | PRN
  Administered 2016-03-31: 25 mg via ORAL
  Filled 2016-03-29: qty 10

## 2016-03-29 MED ORDER — SODIUM CHLORIDE 0.9 % IJ SOLN
INTRAMUSCULAR | Status: DC | PRN
  Administered 2016-03-29: 20 mL

## 2016-03-29 MED ORDER — PROPOFOL 10 MG/ML IV BOLUS
INTRAVENOUS | Status: AC
  Filled 2016-03-29: qty 20

## 2016-03-29 MED ORDER — LACTATED RINGERS IV SOLN
INTRAVENOUS | Status: DC | PRN
  Administered 2016-03-29 (×3): via INTRAVENOUS

## 2016-03-29 MED ORDER — FENTANYL CITRATE (PF) 100 MCG/2ML IJ SOLN
INTRAMUSCULAR | Status: DC | PRN
  Administered 2016-03-29 (×2): 50 ug via INTRAVENOUS

## 2016-03-29 MED ORDER — DEXTROSE 5 % IV SOLN
INTRAVENOUS | Status: DC | PRN
  Administered 2016-03-29: 50 ug/min via INTRAVENOUS

## 2016-03-29 MED ORDER — MIDAZOLAM HCL 2 MG/2ML IJ SOLN
INTRAMUSCULAR | Status: AC
Start: 2016-03-29 — End: 2016-03-29
  Filled 2016-03-29: qty 2

## 2016-03-29 MED ORDER — ATORVASTATIN CALCIUM 80 MG PO TABS
80.0000 mg | ORAL_TABLET | Freq: Every day | ORAL | Status: DC
  Administered 2016-03-29 – 2016-03-31 (×3): 80 mg via ORAL
  Filled 2016-03-29 (×3): qty 1

## 2016-03-29 MED ORDER — METOPROLOL TARTRATE 5 MG/5ML IV SOLN
INTRAVENOUS | Status: AC
  Filled 2016-03-29: qty 10

## 2016-03-29 MED ORDER — LIDOCAINE 2% (20 MG/ML) 5 ML SYRINGE
INTRAMUSCULAR | Status: AC
  Filled 2016-03-29: qty 5

## 2016-03-29 MED ORDER — ONDANSETRON HCL 4 MG/2ML IJ SOLN
INTRAMUSCULAR | Status: DC | PRN
  Administered 2016-03-29: 4 mg via INTRAVENOUS

## 2016-03-29 MED ORDER — BUPIVACAINE-EPINEPHRINE (PF) 0.5% -1:200000 IJ SOLN
INTRAMUSCULAR | Status: DC | PRN
  Administered 2016-03-29: 20 mL via PERINEURAL

## 2016-03-29 MED ORDER — SUMATRIPTAN SUCCINATE 25 MG PO TABS: 25.0000 mg | ORAL_TABLET | ORAL | Status: DC | PRN

## 2016-03-29 MED ORDER — PROMETHAZINE HCL 25 MG/ML IJ SOLN: 6.2500 mg | INTRAMUSCULAR | Status: DC | PRN

## 2016-03-29 MED ORDER — SODIUM CHLORIDE 0.9 % IV SOLN
INTRAVENOUS | Status: DC
  Administered 2016-03-30: via INTRAVENOUS

## 2016-03-29 MED ORDER — SODIUM CHLORIDE 0.9 % IR SOLN
Status: DC | PRN
  Administered 2016-03-29: 3000 mL

## 2016-03-29 MED ORDER — BUPIVACAINE-EPINEPHRINE 0.25% -1:200000 IJ SOLN
INTRAMUSCULAR | Status: DC | PRN
  Administered 2016-03-29: 30 mL

## 2016-03-29 MED ORDER — BUPIVACAINE LIPOSOME 1.3 % IJ SUSP
20.0000 mL | INTRAMUSCULAR | Status: AC
  Administered 2016-03-29: 20 mL
  Filled 2016-03-29: qty 20

## 2016-03-29 MED ORDER — KETOROLAC TROMETHAMINE 15 MG/ML IJ SOLN
INTRAMUSCULAR | Status: AC
  Filled 2016-03-29: qty 1

## 2016-03-29 MED ORDER — PROPOFOL 500 MG/50ML IV EMUL
INTRAVENOUS | Status: DC | PRN
  Administered 2016-03-29: 09:00:00 via INTRAVENOUS
  Administered 2016-03-29: 25 ug/kg/min via INTRAVENOUS

## 2016-03-29 MED ORDER — MORPHINE SULFATE (PF) 4 MG/ML IV SOLN
1.0000 mg | INTRAVENOUS | Status: DC | PRN
  Administered 2016-03-29 (×4): 2 mg via INTRAVENOUS

## 2016-03-29 MED ORDER — METOCLOPRAMIDE HCL 5 MG/ML IJ SOLN: 5.0000 mg | Freq: Three times a day (TID) | INTRAMUSCULAR | Status: DC | PRN

## 2016-03-29 MED ORDER — CEFAZOLIN SODIUM-DEXTROSE 2-4 GM/100ML-% IV SOLN
2.0000 g | Freq: Four times a day (QID) | INTRAVENOUS | Status: AC
  Administered 2016-03-29 (×2): 2 g via INTRAVENOUS
  Filled 2016-03-29 (×2): qty 100

## 2016-03-29 MED ORDER — ONDANSETRON HCL 4 MG PO TABS: 4.0000 mg | ORAL_TABLET | Freq: Four times a day (QID) | ORAL | Status: DC | PRN

## 2016-03-29 MED ORDER — RIVAROXABAN 10 MG PO TABS
10.0000 mg | ORAL_TABLET | Freq: Every day | ORAL | Status: DC
  Administered 2016-03-30 – 2016-03-31 (×2): 10 mg via ORAL
  Filled 2016-03-29 (×2): qty 1

## 2016-03-29 MED ORDER — KETAMINE HCL 10 MG/ML IJ SOLN
INTRAMUSCULAR | Status: DC | PRN
  Administered 2016-03-29 (×3): 10 mg via INTRAVENOUS

## 2016-03-29 MED ORDER — ONDANSETRON HCL 4 MG/2ML IJ SOLN
INTRAMUSCULAR | Status: AC
  Filled 2016-03-29: qty 2

## 2016-03-29 MED ORDER — METOCLOPRAMIDE HCL 5 MG PO TABS: 5.0000 mg | ORAL_TABLET | Freq: Three times a day (TID) | ORAL | Status: DC | PRN

## 2016-03-29 MED ORDER — LIDOCAINE HCL (CARDIAC) 20 MG/ML IV SOLN
INTRAVENOUS | Status: DC | PRN
  Administered 2016-03-29: 60 mg via INTRATRACHEAL

## 2016-03-29 MED ORDER — FENTANYL CITRATE (PF) 100 MCG/2ML IJ SOLN
INTRAMUSCULAR | Status: AC
  Filled 2016-03-29: qty 2

## 2016-03-29 MED ORDER — PROPOFOL 500 MG/50ML IV EMUL
INTRAVENOUS | Status: AC
  Filled 2016-03-29: qty 50

## 2016-03-29 MED ORDER — OXYCODONE HCL 5 MG PO TABS
5.0000 mg | ORAL_TABLET | ORAL | Status: DC | PRN
  Administered 2016-03-29 – 2016-03-31 (×14): 10 mg via ORAL
  Filled 2016-03-29 (×13): qty 2

## 2016-03-29 MED ORDER — KETAMINE HCL-SODIUM CHLORIDE 100-0.9 MG/10ML-% IV SOSY
PREFILLED_SYRINGE | INTRAVENOUS | Status: AC
  Filled 2016-03-29: qty 10

## 2016-03-29 MED ORDER — MIDAZOLAM HCL 2 MG/2ML IJ SOLN
INTRAMUSCULAR | Status: DC | PRN
  Administered 2016-03-29 (×2): 1 mg via INTRAVENOUS

## 2016-03-29 MED ORDER — BISACODYL 10 MG RE SUPP: 10.0000 mg | Freq: Every day | RECTAL | Status: DC | PRN

## 2016-03-29 SURGICAL SUPPLY — 62 items
BAG DECANTER FOR FLEXI CONT (MISCELLANEOUS) ×3 IMPLANT
BANDAGE ESMARK 6X9 LF (GAUZE/BANDAGES/DRESSINGS) ×1 IMPLANT
BLADE SAGITTAL 25.0X1.19X90 (BLADE) ×2 IMPLANT
BLADE SAGITTAL 25.0X1.19X90MM (BLADE) ×1
BNDG CMPR 9X6 STRL LF SNTH (GAUZE/BANDAGES/DRESSINGS) ×1
BNDG ESMARK 6X9 LF (GAUZE/BANDAGES/DRESSINGS) ×3
BOWL SMART MIX CTS (DISPOSABLE) ×3 IMPLANT
CAP KNEE TOTAL 3 SIGMA ×2 IMPLANT
CEMENT HV SMART SET (Cement) ×6 IMPLANT
COVER SURGICAL LIGHT HANDLE (MISCELLANEOUS) ×3 IMPLANT
CUFF TOURNIQUET SINGLE 34IN LL (TOURNIQUET CUFF) ×2 IMPLANT
CUFF TOURNIQUET SINGLE 44IN (TOURNIQUET CUFF) IMPLANT
DECANTER SPIKE VIAL GLASS SM (MISCELLANEOUS) ×3 IMPLANT
DRAPE EXTREMITY T 121X128X90 (DRAPE) ×2 IMPLANT
DRAPE HALF SHEET 40X57 (DRAPES) ×3 IMPLANT
DRAPE PROXIMA HALF (DRAPES) ×3 IMPLANT
DRSG ADAPTIC 3X8 NADH LF (GAUZE/BANDAGES/DRESSINGS) ×3 IMPLANT
DRSG PAD ABDOMINAL 8X10 ST (GAUZE/BANDAGES/DRESSINGS) ×4 IMPLANT
DURAPREP 26ML APPLICATOR (WOUND CARE) ×6 IMPLANT
ELECT CAUTERY BLADE 6.4 (BLADE) ×3 IMPLANT
ELECT REM PT RETURN 9FT ADLT (ELECTROSURGICAL) ×3
ELECTRODE REM PT RTRN 9FT ADLT (ELECTROSURGICAL) ×1 IMPLANT
EVACUATOR 1/8 PVC DRAIN (DRAIN) IMPLANT
FACESHIELD WRAPAROUND (MASK) ×6 IMPLANT
FACESHIELD WRAPAROUND OR TEAM (MASK) ×2 IMPLANT
GAUZE SPONGE 4X4 12PLY STRL (GAUZE/BANDAGES/DRESSINGS) ×3 IMPLANT
GAUZE SPONGE 4X4 12PLY STRL LF (GAUZE/BANDAGES/DRESSINGS) ×2 IMPLANT
GLOVE BIOGEL PI IND STRL 8 (GLOVE) ×1 IMPLANT
GLOVE BIOGEL PI IND STRL 8.5 (GLOVE) ×1 IMPLANT
GLOVE BIOGEL PI INDICATOR 8 (GLOVE) ×4
GLOVE BIOGEL PI INDICATOR 8.5 (GLOVE) ×4
GLOVE ECLIPSE 8.0 STRL XLNG CF (GLOVE) ×6 IMPLANT
GLOVE SURG ORTHO 8.5 STRL (GLOVE) ×6 IMPLANT
GOWN STRL REUS W/ TWL LRG LVL3 (GOWN DISPOSABLE) ×2 IMPLANT
GOWN STRL REUS W/TWL 2XL LVL3 (GOWN DISPOSABLE) ×3 IMPLANT
GOWN STRL REUS W/TWL LRG LVL3 (GOWN DISPOSABLE) ×6
HANDPIECE INTERPULSE COAX TIP (DISPOSABLE) ×3
KIT BASIN OR (CUSTOM PROCEDURE TRAY) ×3 IMPLANT
KIT ROOM TURNOVER OR (KITS) ×3 IMPLANT
MANIFOLD NEPTUNE II (INSTRUMENTS) ×3 IMPLANT
NEEDLE 22X1 1/2 (OR ONLY) (NEEDLE) ×3 IMPLANT
NS IRRIG 1000ML POUR BTL (IV SOLUTION) ×3 IMPLANT
PACK TOTAL JOINT (CUSTOM PROCEDURE TRAY) ×3 IMPLANT
PAD ARMBOARD 7.5X6 YLW CONV (MISCELLANEOUS) ×6 IMPLANT
PAD CAST 4YDX4 CTTN HI CHSV (CAST SUPPLIES) ×1 IMPLANT
PADDING CAST COTTON 4X4 STRL (CAST SUPPLIES) ×3
PADDING CAST COTTON 6X4 STRL (CAST SUPPLIES) ×3 IMPLANT
SET HNDPC FAN SPRY TIP SCT (DISPOSABLE) ×1 IMPLANT
STAPLER VISISTAT 35W (STAPLE) ×3 IMPLANT
SUCTION FRAZIER HANDLE 10FR (MISCELLANEOUS) ×2
SUCTION TUBE FRAZIER 10FR DISP (MISCELLANEOUS) ×1 IMPLANT
SURGIFLO W/THROMBIN 8M KIT (HEMOSTASIS) IMPLANT
SUT BONE WAX W31G (SUTURE) ×3 IMPLANT
SUT ETHIBOND NAB CT1 #1 30IN (SUTURE) ×6 IMPLANT
SUT MNCRL AB 3-0 PS2 18 (SUTURE) ×3 IMPLANT
SUT VIC AB 0 CT1 27 (SUTURE) ×3
SUT VIC AB 0 CT1 27XBRD ANBCTR (SUTURE) ×1 IMPLANT
SYR CONTROL 10ML LL (SYRINGE) ×2 IMPLANT
TOWEL OR 17X24 6PK STRL BLUE (TOWEL DISPOSABLE) ×3 IMPLANT
TOWEL OR 17X26 10 PK STRL BLUE (TOWEL DISPOSABLE) ×3 IMPLANT
TRAY FOLEY BAG SILVER LF 16FR (SET/KITS/TRAYS/PACK) ×3 IMPLANT
WRAP KNEE MAXI GEL POST OP (GAUZE/BANDAGES/DRESSINGS) ×3 IMPLANT

## 2016-03-29 NOTE — Anesthesia Procedure Notes (Addendum)
Anesthesia Regional Block: Adductor canal block   Pre-Anesthetic Checklist: ,, timeout performed,, Correct Site, Correct Laterality, Correct Procedure, Correct Position, site marked, risks and benefits discussed, Surgical consent,  Pre-op evaluation,  At surgeon's request and post-op pain management  Laterality: Right and Lower  Prep: chloraprep       Needles:      Needle Length: 9cm  Needle Gauge: 21   Needle insertion depth: 5 cm   Additional Needles:   Procedures: ultrasound guided,,,,,,,,  Narrative:  Start time: 03/29/2016 6:55 AM End time: 03/29/2016 7:07 AM Injection made incrementally with aspirations every 5 mL.  Performed by: Personally  Anesthesiologist: Tenoch Mcclure

## 2016-03-29 NOTE — Evaluation (Signed)
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 Overall movign well; will need reinforcement of knee prec and 50% PWB; anticipate good progress; Pt will benefit from skilled PT to increase their independence and safety with mobility to allow discharge to the venue listed below.      Follow Up Recommendations Home health PT    Equipment Recommendations  Rolling walker with 5" wheels;3in1 (PT) (may already have)    Recommendations for Other Services       Precautions / Restrictions Precautions Precautions: Knee Precaution Comments: Pt educated to not allow any pillow or bolster under knee for healing with optimal range of motion.  Restrictions Weight Bearing Restrictions: Yes RLE Weight Bearing: Partial weight bearing RLE Partial Weight Bearing Percentage or Pounds: 50      Mobility  Bed Mobility Overal bed mobility: Needs Assistance Bed Mobility: Supine to Sit     Supine to sit: Supervision     General bed mobility comments: Cues for technqiue  Transfers Overall transfer level: Needs assistance Equipment used: Rolling walker (2 wheeled) Transfers: Sit to/from Stand Sit to Stand: Min assist         General transfer comment: min assist to steady  Ambulation/Gait Ambulation/Gait assistance: Min guard Ambulation Distance (Feet):  (pivotal steps bed to recliner) Assistive device: Rolling walker (2 wheeled) Gait Pattern/deviations: Step-to pattern      General Gait Details: Cues for sequence and to use RW to offload painful R knee  Stairs            Wheelchair Mobility    Modified Rankin (Stroke Patients Only)       Balance                                             Pertinent Vitals/Pain Pain Assessment: 0-10 Pain Score: 8  Pain Location: R knee Pain Descriptors / Indicators: Aching;Grimacing Pain Intervention(s): Monitored during session;RN gave pain meds during session    Home Living Family/patient expects to be discharged to:: Private residence Living Arrangements: Spouse/significant other;Children Available Help at Discharge: Family;Available PRN/intermittently Type of Home: House Home Access: Level entry     Home Layout: Two level;Able to live on main level with bedroom/bathroom Home Equipment: Other (comment) (To be determined)      Prior Function Level of Independence: Independent               Hand Dominance        Extremity/Trunk Assessment   Upper Extremity Assessment Upper Extremity Assessment: Overall WFL for tasks assessed    Lower Extremity Assessment Lower Extremity Assessment: RLE deficits/detail RLE Deficits / Details: Grossly decr AROM and strength, limited by pain postop       Communication   Communication: No difficulties  Cognition Arousal/Alertness: Awake/alert Behavior During Therapy: WFL for tasks assessed/performed Overall Cognitive Status: Within Functional Limits for tasks assessed  General Comments      Exercises     Assessment/Plan    PT Assessment Patient needs continued PT services  PT Problem List Decreased strength;Decreased range of motion;Decreased activity tolerance;Decreased balance;Decreased mobility;Decreased knowledge of use of DME;Decreased knowledge of precautions;Pain       PT Treatment Interventions DME instruction;Gait training;Functional mobility  training;Therapeutic activities;Therapeutic exercise;Balance training;Patient/family education    PT Goals (Current goals can be found in the Care Plan section)  Acute Rehab PT Goals Patient Stated Goal: walk without pain PT Goal Formulation: With patient Time For Goal Achievement: 04/05/16 Potential to Achieve Goals: Good    Frequency 7X/week   Barriers to discharge        Co-evaluation               End of Session Equipment Utilized During Treatment: Gait belt Activity Tolerance: Patient tolerated treatment well Patient left: in chair;with call bell/phone within reach Nurse Communication: Mobility status PT Visit Diagnosis: Other abnormalities of gait and mobility (R26.89);Pain Pain - Right/Left: Right Pain - part of body: Knee    Time: 2449-7530 PT Time Calculation (min) (ACUTE ONLY): 21 min   Charges:   PT Evaluation $PT Eval Low Complexity: 1 Procedure     PT G Codes:        Roney Marion, PT  Acute Rehabilitation Services Pager [phone] Office [phone]   Colletta [place] 03/29/2016, 4:08 PM

## 2016-03-29 NOTE — Discharge Instructions (Signed)
Information on my medicine - XARELTO® (Rivaroxaban) ° °This medication education was reviewed with me or my healthcare representative as part of my discharge preparation.  The pharmacist that spoke with me during my hospital stay was:  Keyonda Bickle Dien, RPH ° °Why was Xarelto® prescribed for you? °Xarelto® was prescribed for you to reduce the risk of blood clots forming after orthopedic surgery. The medical term for these abnormal blood clots is venous thromboembolism (VTE). ° °What do you need to know about xarelto® ? °Take your Xarelto® ONCE DAILY at the same time every day. °You may take it either with or without food. ° °If you have difficulty swallowing the tablet whole, you may crush it and mix in applesauce just prior to taking your dose. ° °Take Xarelto® exactly as prescribed by your doctor and DO NOT stop taking Xarelto® without talking to the doctor who prescribed the medication.  Stopping without other VTE prevention medication to take the place of Xarelto® may increase your risk of developing a clot. ° °After discharge, you should have regular check-up appointments with your healthcare provider that is prescribing your Xarelto®.   ° °What do you do if you miss a dose? °If you miss a dose, take it as soon as you remember on the same day then continue your regularly scheduled once daily regimen the next day. Do not take two doses of Xarelto® on the same day.  ° °Important Safety Information °A possible side effect of Xarelto® is bleeding. You should call your healthcare provider right away if you experience any of the following: °? Bleeding from an injury or your nose that does not stop. °? Unusual colored urine (red or dark brown) or unusual colored stools (red or black). °? Unusual bruising for unknown reasons. °? A serious fall or if you hit your head (even if there is no bleeding). ° °Some medicines may interact with Xarelto® and might increase your risk of bleeding while on Xarelto®. To help avoid  this, consult your healthcare provider or pharmacist prior to using any new prescription or non-prescription medications, including herbals, vitamins, non-steroidal anti-inflammatory drugs (NSAIDs) and supplements. ° °This website has more information on Xarelto®: www.xarelto.com. ° ° ° °

## 2016-03-29 NOTE — Op Note (Signed)
NAME:  Shannon Jennings, Shannon Jennings                      ACCOUNT NO.:  MEDICAL RECORD NO.:  [id number]  LOCATION:                                 FACILITY:  PHYSICIAN:  Vonna Kotyk. Durward Fortes, M.D.    DATE OF BIRTH:  DATE OF PROCEDURE:  03/29/2016 DATE OF DISCHARGE:                              OPERATIVE REPORT   PREOPERATIVE DIAGNOSIS:  End-stage osteoarthritis, right knee.  POSTOPERATIVE DIAGNOSIS:  End-stage osteoarthritis, right knee.  PROCEDURE:  Right total knee replacement.  SURGEON:  Vonna Kotyk. Durward Fortes, M.D.  ASSISTANT:  Aaron Edelman D. Petrarca, P.A.-C.  ANESTHESIA:  Spinal with adductor canal block.  COMPLICATIONS:  None.  COMPONENTS:  DePuy LCS medium femoral component, #2 rotating keeled tibial tray with a 10-mm polyethylene bridging bearing, and metal-backed 3-peg rotating patella.  All components were press-fit.  DESCRIPTION OF PROCEDURE:  Shannon Jennings was met in the holding area, identified the right knee as appropriate operative site and marked it accordingly.  Anesthesia performed an adductor canal block.  The patient was then transported to room #6 and placed under spinal anesthetic per Anesthesia without difficulty.  Nursing staff inserted a Foley catheter.  Urine was clear.  The right lower extremity which had been previously identified as appropriate site was then prepped with chlorhexidine scrub and DuraPrep x2 from the thigh tourniquet to the tips of the toes.  Sterile draping was performed.  Time-out was called.  The right lower extremity was then Esmarch exsanguinated with a proximal tourniquet at 350 mmHg.  Midline longitudinal incision was made, centered about the patella, extending from the superior pouch to tibial tubercle.  Via sharp dissection, incision was carried down to subcutaneous tissue.  First layer of capsule was incised in midline and medial parapatellar incision was made through the deep capsule.  The joint was entered.  There was a minimal clear yellow  joint effusion.  The patella was everted 180 degrees laterally, knee flexed to 90 degrees.  There were moderate osteophytes along the medial and lateral femoral condyles.  Near complete absence of articular cartilage along the medial femoral condyle and to a lesser extent the medial tibial plateau.  There were osteophytes along the medial tibial plateau.  These were removed.  There was considerable chondromalacia of the lateral compartment and patellofemoral joint.  Minimal synovitis.  I measured a medium femoral component.  First bony cut was then made transversely in the proximal tibia with a 7- degree angle of declination using the external tibial guide.  With each bony cut on the tibia and the femur, we checked our alignment with the alignment guides.  Subsequent cuts were then made on the femur using the medium guide.  A 4-degree distal femoral valgus cut was utilized.  Flexion-extension gaps were perfectly symmetrical and very nicely aligned at 10 mm.  Laminar spreaders were then placed along the medial lateral compartments.  I removed medial and lateral menisci, ACL and PCL, and removed any osteophytes in the posterior femoral condyle using a 3/4- inch curved osteotome.  A small Baker cyst was identified posteromedially and any observed bursal tissue was debrided.  There were no loose bodies.  Final cut  was then made on the femur for the tapering purposes.  MCL and LCL remained intact.  Retractors were then placed around the tibia.  #2 tibial tray was measured, pinned in placed within and alignment was checked.  The center hole was then made followed by the keeled cut.  With the tibial trial in place, the 10-mm polyethylene bridging bearing was then inserted followed by the medium trial femoral component.  The entire construct was reduced and through a full range of motion, was perfectly stable. There was no opening with varus or valgus stress.  The patella was  prepared by removing 8 mm of bone, leaving approximately 12.5 mm patella thickness.  Patella jig was applied, 3-holes made.  The trial patella inserted through full range of motion after reduction was perfectly stable.  Trial components were then removed.  The joint was copiously irrigated with saline solution.  The final components were then impacted with polymethyl methacrylate. We initially inserted #2 tibial tray followed by the 10-mm polyethylene bridging bearing and then the medium femoral component.  Any extraneous methacrylate was removed from the periphery of the components.  Patella was applied with a clamp and methacrylate.  At approximately 16 minutes, the methacrylate had matured, during which time, we injected Exparel.  The patient has had difficulty with some pain medicines.  We felt that this was the best approach for pain control.  The tourniquet was deflated at 61 minutes.  Bleeding was controlled with the Bovie and the tranexamic acid topically though we had a nice dry field.  The deep capsule was closed with a running #1 Ethibond, superficial capsule with 0 Vicryl, subcu with 3-0 Monocryl.  Skin closed with skin clips.  Sterile bulky dressing was applied followed by the patient's support stocking.  The patient tolerated the procedure without complications.     Vonna Kotyk. Durward Fortes, M.D.     PWW/MEDQ  D:  03/29/2016  T:  03/29/2016  Job:  [id number]

## 2016-03-29 NOTE — Transfer of Care (Signed)
Immediate Anesthesia Transfer of Care Note  Patient: Shannon Jennings  Procedure(s) Performed: Procedure(s): TOTAL KNEE ARTHROPLASTY (Right)  Patient Location: PACU  Anesthesia Type:MAC combined with regional for post-op pain  Level of Consciousness: awake, oriented and patient cooperative  Airway & Oxygen Therapy: Patient Spontanous Breathing and Patient connected to nasal cannula oxygen  Post-op Assessment: Report given to RN, Post -op Vital signs reviewed and stable and Patient moving all extremities  Post vital signs: Reviewed and stable  Last Vitals:  Vitals:   03/29/16 0554  BP: 127/83  Pulse: 86  Resp: 20  Temp: 36.8 C    Last Pain:  Vitals:   03/29/16 0554  TempSrc: Oral         Complications: No apparent anesthesia complications

## 2016-03-29 NOTE — Anesthesia Preprocedure Evaluation (Addendum)
Anesthesia Evaluation  Patient identified by MRN, date of birth, ID band Patient awake    History of Anesthesia Complications (+) DIFFICULT AIRWAY  Airway Mallampati: III  TM Distance: <3 FB Neck ROM: Full    Dental  (+) Teeth Intact   Pulmonary    breath sounds clear to auscultation       Cardiovascular hypertension, + CAD   Rhythm:Regular Rate:Normal     Neuro/Psych    GI/Hepatic   Endo/Other  Hypothyroidism   Renal/GU      Musculoskeletal   Abdominal   Peds  Hematology  (+) anemia ,   Anesthesia Other Findings   Reproductive/Obstetrics                            Anesthesia Physical Anesthesia Plan  ASA: III  Anesthesia Plan: Spinal   Post-op Pain Management:    Induction:   Airway Management Planned: Natural Airway  Additional Equipment:   Intra-op Plan:   Post-operative Plan:   Informed Consent: I have reviewed the patients History and Physical, chart, labs and discussed the procedure including the risks, benefits and alternatives for the proposed anesthesia with the patient or authorized representative who has indicated his/her understanding and acceptance.     Plan Discussed with:   Anesthesia Plan Comments:         Anesthesia Quick Evaluation

## 2016-03-29 NOTE — Anesthesia Postprocedure Evaluation (Addendum)
Anesthesia Post Note  Patient: Shannon Jennings  Procedure(s) Performed: Procedure(s) (LRB): TOTAL KNEE ARTHROPLASTY (Right)  Patient location during evaluation: PACU Anesthesia Type: Spinal Level of consciousness: oriented and awake and alert Pain management: pain level controlled Vital Signs Assessment: post-procedure vital signs reviewed and stable Respiratory status: spontaneous breathing, respiratory function stable and patient connected to nasal cannula oxygen Cardiovascular status: blood pressure returned to baseline and stable Postop Assessment: no headache and no backache Anesthetic complications: no       Last Vitals:  Vitals:   03/29/16 0554 03/29/16 0935  BP: 127/83 114/75  Pulse: 86 93  Resp: 20 14  Temp: 36.8 C 36.7 C    Last Pain:  Vitals:   03/29/16 0554  TempSrc: Oral                 Yuval Nolet,JAMES TERRILL

## 2016-03-29 NOTE — Op Note (Signed)
PATIENT ID:      THEA HOLSHOUSER  MRN:     [id number] DOB/AGE:    [birth year] / 56 y.o.       OPERATIVE REPORT    DATE OF PROCEDURE:  03/29/2016       PREOPERATIVE DIAGNOSIS:   RIGHT KNEE OSETOARTHRITIS                                                       Estimated body mass index is 26.53 kg/m as calculated from the following:   Height as of this encounter: 5' 3.5" (1.613 m).   Weight as of this encounter: 152 lb 2 oz (69 kg).     POSTOPERATIVE DIAGNOSIS:   RIGHT KNEE OSETOARTHRITIS                                                                     Estimated body mass index is 26.53 kg/m as calculated from the following:   Height as of this encounter: 5' 3.5" (1.613 m).   Weight as of this encounter: 152 lb 2 oz (69 kg).     PROCEDURE:  Procedure(s: RIGHT TOTAL KNEE ARTHROPLASTY     SURGEON:  [name], MD    ASSISTANT:   [name], PA-C   (Present and scrubbed throughout the case, critical for assistance with exposure, retraction, instrumentation, and closure.)          ANESTHESIA: regional and spinal     DRAINS: none :      TOURNIQUET TIME:  Total Tourniquet Time Documented: Thigh (Right) - 61 minutes Total: Thigh (Right) - 61 minutes     COMPLICATIONS:  None   CONDITION:  stable  PROCEDURE IN DETAIL: [place] 03/29/2016, 9:16 AM

## 2016-03-29 NOTE — Progress Notes (Signed)
Orthopedic Tech Progress Note Patient Details:  Shannon Jennings [birth year] [id number]  CPM Right Knee CPM Right Knee: On Right Knee Flexion (Degrees): 90 Right Knee Extension (Degrees): 0 Additional Comments: trapez3e bar patient helper   Hildred Priest 03/29/2016, 10:19 AM Viewed order from doctor's order list

## 2016-03-29 NOTE — Anesthesia Procedure Notes (Signed)
Spinal  Patient location during procedure: OR Start time: 03/29/2016 7:20 AM End time: 03/29/2016 7:30 AM Staffing Anesthesiologist: Rica Koyanagi Performed: anesthesiologist  Preanesthetic Checklist Completed: patient identified, site marked, surgical consent, pre-op evaluation, timeout performed, IV checked, risks and benefits discussed and monitors and equipment checked Spinal Block Patient position: sitting Prep: ChloraPrep Patient monitoring: heart rate, cardiac monitor, continuous pulse ox and blood pressure Approach: midline Location: L3-4 Needle Needle type: Pencan  Needle gauge: 25 G Needle length: 9 cm Assessment Sensory level: T6

## 2016-03-30 ENCOUNTER — Encounter (HOSPITAL_COMMUNITY): Payer: Self-pay | Admitting: Orthopaedic Surgery

## 2016-03-30 LAB — BASIC METABOLIC PANEL WITH GFR
Anion gap: 7 (ref 5–15)
BUN: 12 mg/dL (ref 6–20)
CO2: 28 mmol/L (ref 22–32)
Calcium: 9 mg/dL (ref 8.9–10.3)
Chloride: 104 mmol/L (ref 101–111)
Creatinine, Ser: 0.74 mg/dL (ref 0.44–1.00)
GFR calc Af Amer: >60 mL/min
GFR calc non Af Amer: >60 mL/min
Glucose, Bld: 159 mg/dL — ABNORMAL HIGH (ref 65–99)
Potassium: 4.7 mmol/L (ref 3.5–5.1)
Sodium: 139 mmol/L (ref 135–145)

## 2016-03-30 LAB — CBC
HCT: 31.8 % — ABNORMAL LOW (ref 36.0–46.0)
Hemoglobin: 9.7 g/dL — ABNORMAL LOW (ref 12.0–15.0)
MCH: 19.8 pg — ABNORMAL LOW (ref 26.0–34.0)
MCHC: 30.5 g/dL (ref 30.0–36.0)
MCV: 64.8 fL — ABNORMAL LOW (ref 78.0–100.0)
Platelets: 162 K/uL (ref 150–400)
RBC: 4.91 MIL/uL (ref 3.87–5.11)
RDW: 14.7 % (ref 11.5–15.5)
WBC: 8.5 K/uL (ref 4.0–10.5)

## 2016-03-30 MED ORDER — FENTANYL CITRATE (PF) 100 MCG/2ML IJ SOLN
12.5000 ug | INTRAMUSCULAR | Status: DC | PRN
  Administered 2016-03-30 – 2016-03-31 (×11): 25 ug via INTRAVENOUS
  Filled 2016-03-30 (×12): qty 2

## 2016-03-30 MED ORDER — FENTANYL 12 MCG/HR TD PT72
12.5000 ug | MEDICATED_PATCH | TRANSDERMAL | Status: DC
  Administered 2016-03-30: 12.5 ug via TRANSDERMAL
  Filled 2016-03-30: qty 1

## 2016-03-30 MED ORDER — CELECOXIB 200 MG PO CAPS
200.0000 mg | ORAL_CAPSULE | Freq: Every day | ORAL | Status: DC
  Administered 2016-03-30 – 2016-03-31 (×2): 200 mg via ORAL
  Filled 2016-03-30 (×2): qty 1

## 2016-03-30 MED ORDER — CYCLOBENZAPRINE HCL 5 MG PO TABS
7.5000 mg | ORAL_TABLET | Freq: Three times a day (TID) | ORAL | Status: DC | PRN
  Administered 2016-03-30 – 2016-03-31 (×3): 7.5 mg via ORAL
  Filled 2016-03-30 (×7): qty 1.5

## 2016-03-30 NOTE — Progress Notes (Signed)
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);Fibromyalgia;  History of Graves' disease;  has a pertinent  past surgical history that includes Distal interphalangeal joint fusion  Hand surgeries    PT Comments    Significant pain limiting activity tolerance this session; Unable to put appreciable weight on RLE, but able to get R heel to floor; I anticipate good progress once pain is under control   Follow Up Recommendations  Home health PT     Equipment Recommendations  Rolling walker with 5" wheels;3in1 (PT) (may already have)    Recommendations for Other Services OT consult     Precautions / Restrictions Precautions Precautions: Knee Precaution Booklet Issued: Yes (comment) Precaution Comments: Pt educated to not allow any pillow or bolster under knee for healing with optimal range of motion.  Restrictions Weight Bearing Restrictions: Yes RLE Weight Bearing: Partial weight bearing RLE Partial Weight Bearing Percentage or Pounds: 50    Mobility  Bed Mobility Overal bed mobility: Needs Assistance Bed Mobility: Supine to Sit     Supine to sit: Min assist     General bed mobility comments: Min assist to support painful RLE  Transfers Overall transfer level: Needs assistance Equipment used: Rolling walker (2 wheeled) Transfers: Sit to/from Stand Sit to Stand: Min assist         General transfer comment: min assist to steady  Ambulation/Gait Ambulation/Gait assistance: Min guard Ambulation Distance (Feet): 7 Feet Assistive device: Rolling walker (2 wheeled) Gait Pattern/deviations: Step-to pattern Gait velocity: extremely slow, and limited by pain Gait velocity interpretation: Below normal  speed for age/gender General Gait Details: Cues for sequence and to use RW to offload painful R knee; did not tolerate much weight bearing at all, but able to get R heel to floor   Stairs            Wheelchair Mobility    Modified Rankin (Stroke Patients Only)       Balance                                            Cognition Arousal/Alertness: Awake/alert Behavior During Therapy: WFL for tasks assessed/performed Overall Cognitive Status: Within Functional Limits for tasks assessed                                        Exercises      General Comments General comments (skin integrity, edema, etc.): Held therex this session due to extreme R knee pain      Pertinent Vitals/Pain Pain Assessment: 0-10 Pain Score: 10-Worst pain ever Pain Location: R knee Pain Descriptors / Indicators: Aching;Grimacing Pain Intervention(s): Limited activity within patient's tolerance;Premedicated before session    Home Living                      Prior Function            PT Goals (current goals can now be found in the care plan section) Acute Rehab PT Goals Patient Stated Goal: walk without pain PT Goal Formulation: With patient  Time For Goal Achievement: 04/05/16 Potential to Achieve Goals: Good Progress towards PT goals: Progressing toward goals (thoguh limited by significant pain)    Frequency    7X/week      PT Plan Current plan remains appropriate    Co-evaluation             End of Session Equipment Utilized During Treatment: Gait belt Activity Tolerance: Patient limited by pain Patient left: in chair;with call bell/phone within reach Nurse Communication: Mobility status PT Visit Diagnosis: Other abnormalities of gait and mobility (R26.89);Pain Pain - Right/Left: Right Pain - part of body: Knee     Time: 0911-0926 PT Time Calculation (min) (ACUTE ONLY): 15 min  Charges:  $Gait Training: 8-22 mins                     G Codes:       Roney Marion, PT  Acute Rehabilitation Services Pager [phone] Office [phone]    Colletta [place] 03/30/2016, 10:48 AM  Roney Marion, [place] Pager [phone] Office [phone]

## 2016-03-30 NOTE — Care Management Note (Signed)
Case Management Note  Patient Details  Name: Shannon Jennings MRN: [id number] Date of Birth: 1960/06/23  Subjective/Objective:   56 yr old female s/p right total knee arthroplasty.                 Action/Plan: Case manager spoke with patient and family concerning [facility] and DME needs. Patient was offered choice for [facility]. Patient resides in [place], [place], [place] called referral to [facility] in [place]. Spoke with [phone], faxed orders and demographics to her at [phone]. Patient will have family support at discharge.    Expected Discharge Date:    03/31/16              Expected Discharge Plan:    Home with Home Health In-House Referral:     Discharge planning Services  CM Consult  Post Acute Care Choice:  Home Health, Durable Medical Equipment Choice offered to:  Patient  DME Arranged:  3-N-1, CPM, Walker rolling DME Agency:  [employer]  HH Arranged:  PT HH Agency:  [facility]  Status of Service:  In process, will continue to follow  If discussed at Long Length of Stay Meetings, dates discussed:    Additional Comments:  Ninfa Meeker, RN 03/30/2016, 11:30 AM

## 2016-03-30 NOTE — Progress Notes (Signed)
PATIENT ID: Shannon Jennings        MRN:  [id number]          DOB/AGE: [birth year] / 57 y.o.    Shannon Fears, MD   [name], PA-C [address] [place], [place]  [address]                             [phone]   PROGRESS NOTE  Subjective:  positive for Chest Pain  negative for Shortness of Breath  negative for Nausea/Vomiting   negative for Calf Pain    Tolerating Diet: yes         Patient reports pain as moderate and severe.     Had problem with Toradol that of chest pain  Complaints of spasms and poor pain control  Objective: Vital signs in last 24 hours:   Patient Vitals for the past 24 hrs:  BP Temp Temp src Pulse Resp SpO2  03/30/16 0459 105/79 98.7 F (37.1 C) Oral 93 16 94 %  03/30/16 0056 115/63 98.2 F (36.8 C) Oral 68 16 96 %  03/29/16 2024 (!) 101/55 98.6 F (37 C) Oral 71 16 96 %  03/29/16 1620 (!) 122/59 98.5 F (36.9 C) Oral 93 16 100 %  03/29/16 1430 113/71 98 F (36.7 C) Oral 83 18 99 %  03/29/16 1230 138/73 98 F (36.7 C) Oral 80 18 93 %  03/29/16 1200 121/74 98 F (36.7 C) - 84 18 97 %  03/29/16 1045 - - - 84 12 97 %  03/29/16 1030 127/77 97.9 F (36.6 C) - 91 14 99 %  03/29/16 1015 123/79 - - 87 17 97 %  03/29/16 1000 126/79 - - 89 15 95 %  03/29/16 0945 120/73 - - 90 13 93 %  03/29/16 0935 114/75 98 F (36.7 C) - 93 14 95 %      Intake/Output from previous day:   03/27 0701 - 03/28 0700 In: 2998.8 [P.O.:480; I.V.:2318.8] Out: 2150 [Urine:2100]   Intake/Output this shift:   No intake/output data recorded.   Intake/Output      03/27 0701 - 03/28 0700 03/28 0701 - 03/29 0700   P.O. 480    I.V. (mL/kg) 2318.8 (33.6)    IV Piggyback 200    Total Intake(mL/kg) 2998.8 (43.5)    Urine (mL/kg/hr) 2100 (1.3)    Blood 50 (0)    Total Output 2150     Net +848.8             LABORATORY DATA:  Recent Labs  03/30/16 0250  WBC 8.5  HGB 9.7*  HCT 31.8*  PLT 162    Recent Labs  03/30/16 0250  NA 139  K 4.7  CL 104  CO2  28  BUN 12  CREATININE 0.74  GLUCOSE 159*  CALCIUM 9.0   Lab Results  Component Value Date   INR 1.09 03/22/2016    Recent Radiographic Studies :  Dg Chest 2 View  Result Date: 03/22/2016 CLINICAL DATA:  Preop knee replacement history of hypertension and coronary artery disease EXAM: CHEST  2 VIEW COMPARISON:  None. FINDINGS: Scarring or atelectasis in the right middle lobe. No pleural effusion or consolidation. Normal cardiomediastinal silhouette. No pneumothorax. Surgical clips in the right upper quadrant. IMPRESSION: Linear scarring or atelectasis in the right middle lobe. No radiographic evidence for acute cardiopulmonary abnormality. Electronically Signed   By: Donavan Foil M.D.   On:  03/22/2016 13:58     Examination:  General appearance: alert, cooperative and moderate distress Resp: clear to auscultation bilaterally Cardio: regular rate and rhythm GI: normal findings: positive bowel sounds  Wound Exam: clean, dry, intact   Drainage:  None: wound tissue dry  Motor Exam: EHL, FHL, Anterior Tibial and Posterior Tibial Intact  Sensory Exam: Superficial Peroneal, Deep Peroneal and Tibial normal  Vascular Exam: Right dorsalis pedis artery has 1+ (weak) pulse  Assessment:    1 Day Post-Op  Procedure(s) (LRB): TOTAL KNEE ARTHROPLASTY (Right)  ADDITIONAL DIAGNOSIS:  Active Problems:   Primary osteoarthritis of right knee     Plan: Physical Therapy as ordered Partial Weight Bearing @ 50% (PWB)  DVT Prophylaxis:  Xarelto, Foot Pumps and TED hose  DISCHARGE PLAN: Home  DISCHARGE NEEDS: HHPT, CPM, Walker and 3-in-1 comode seat   Will try fentanyl for breakthrough pain Changing robaxin to flexeril        [name]  03/30/2016 8:22 AM  Patient ID: Shannon Jennings, female   DOB: 06/16/1960, 56 y.o.   MRN: [id number]

## 2016-03-30 NOTE — Progress Notes (Signed)
PT Cancellation Note  Patient Details Name: Shannon Jennings MRN: [id number] DOB: 09-18-1960   Cancelled Treatment:    Reason Eval/Treat Not Completed: Other (comment)   Shannon Jennings is politely declining PT this afternoon; she has been in a considerable amount of pain since this morning's session; We did discuss the importance of getting up and moving as she is able to tolerate; She agrees to try the CPM later today;   Will follow up tomorrow;  I still anticipate good progress once her pain is under control;   Roney Marion, [place] Pager [phone] Office [phone]    Colletta Maryland 03/30/2016, 3:21 PM

## 2016-03-30 NOTE — Evaluation (Signed)
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 Pt with extreme pain affecting all adls and balance. Patient will benefit from skilled OT acutely to increase independence and safety with ADLS to allow discharge [place].     Follow Up Recommendations  Home health OT    Equipment Recommendations  3 in 1 bedside commode    Recommendations for Other Services       Precautions / Restrictions Precautions Precautions: Knee Precaution Booklet Issued: Yes (comment) Restrictions Weight Bearing Restrictions: Yes RLE Weight Bearing: Partial weight bearing RLE Partial Weight Bearing Percentage or Pounds: 50      Mobility Bed Mobility Overal bed mobility: Needs Assistance Bed Mobility: Supine to Sit     Supine to sit: Mod assist     General bed mobility comments: use of leg lifter for transfer and reports decr pain  Transfers Overall transfer level: Needs assistance Equipment used: Rolling walker (2 wheeled) Transfers: Sit to/from Stand Sit to Stand: Mod assist         General transfer comment: requires (A) holding and guarding R LE    Balance                                           ADL either performed or assessed with clinical judgement   ADL Overall ADL's : Needs assistance/impaired Eating/Feeding: Independent                       Toilet Transfer: Moderate assistance;Stand-pivot   Toileting- Clothing Manipulation and Hygiene: Min guard;Sitting/lateral lean         General ADL Comments: pt reporting bed level only care and then reports need to void bladder. pt agreeable to 3n1 and voiding > 200 cc of urine. pt reports extreme pain after transfer. Educatedon leg lifter to help with independence of R LE movement. pt with SCD around R thigh on arrival due to spasms attempting to compress the leg     Vision         Perception     Praxis      Pertinent Vitals/Pain Pain Assessment: 0-10 Pain Score: 10-Worst pain ever Pain Location: R knee Pain Descriptors / Indicators: Aching;Grimacing Pain Intervention(s): Limited activity within patient's tolerance;Monitored during session;Premedicated before session;Repositioned     Hand Dominance Right   Extremity/Trunk Assessment Upper Extremity Assessment Upper Extremity Assessment: Overall WFL for tasks assessed   Lower Extremity Assessment Lower Extremity Assessment: Defer to PT evaluation   Cervical / Trunk Assessment Cervical / Trunk Assessment: Normal   Communication Communication Communication: No difficulties   Cognition Arousal/Alertness: Awake/alert Behavior During Therapy: WFL for tasks assessed/performed Overall Cognitive Status: Within Functional Limits for tasks assessed  General Comments       Exercises     Shoulder Instructions      Home Living Family/patient expects to be discharged to:: Private residence Living Arrangements: Spouse/significant other;Children Available Help at Discharge: Family;Available PRN/intermittently Type of Home: House Home Access: Level entry     Home Layout: Two level;Able to live on main level with bedroom/bathroom     Bathroom Shower/Tub: Teacher, early years/pre: Standard                Prior Functioning/Environment Level of Independence: Independent                 OT  Problem List: Decreased strength;Decreased activity tolerance;Impaired balance (sitting and/or standing);Decreased safety awareness;Decreased knowledge of use of DME or AE;Decreased knowledge of precautions;Pain      OT Treatment/Interventions: Self-care/ADL training;Therapeutic exercise;DME and/or AE instruction;Therapeutic activities;Balance training;Patient/family education    OT Goals(Current goals can be found in the care plan section) Acute Rehab OT Goals Patient Stated Goal: walk without pain OT Goal Formulation: With patient Time For Goal Achievement: 04/13/16 Potential to Achieve Goals: Good  OT Frequency: Min 2X/week   Barriers to D/C:            Co-evaluation              End of Session CPM Right Knee CPM Right Knee: Off Nurse Communication: Mobility status;Precautions  Activity Tolerance: Patient limited by pain Patient left: in bed;with call bell/phone within reach  OT Visit Diagnosis: Unsteadiness on feet (R26.81)                Time: 2725-3664 OT Time Calculation (min): 23 min Charges:  OT General Charges $OT Visit: 1 Procedure OT Evaluation $OT Eval Moderate Complexity: 1 Procedure G-Codes:      Jeri Modena   OTR/L Pager: [phone] Office: [phone] .   Parke Poisson B 03/30/2016, 3:54 PM

## 2016-03-31 LAB — CBC
HCT: 33.2 % — ABNORMAL LOW (ref 36.0–46.0)
Hemoglobin: 10.2 g/dL — ABNORMAL LOW (ref 12.0–15.0)
MCH: 19.5 pg — AB (ref 26.0–34.0)
MCHC: 30.7 g/dL (ref 30.0–36.0)
MCV: 63.5 fL — AB (ref 78.0–100.0)
PLATELETS: 191 10*3/uL (ref 150–400)
RBC: 5.23 MIL/uL — ABNORMAL HIGH (ref 3.87–5.11)
RDW: 14.5 % (ref 11.5–15.5)
WBC: 10.2 10*3/uL (ref 4.0–10.5)

## 2016-03-31 LAB — BASIC METABOLIC PANEL
ANION GAP: 7 (ref 5–15)
BUN: 8 mg/dL (ref 6–20)
CALCIUM: 9.1 mg/dL (ref 8.9–10.3)
CO2: 26 mmol/L (ref 22–32)
CREATININE: 0.6 mg/dL (ref 0.44–1.00)
Chloride: 102 mmol/L (ref 101–111)
GFR calc Af Amer: >60 mL/min (ref >60)
GLUCOSE: 135 mg/dL — AB (ref 65–99)
Potassium: 4.1 mmol/L (ref 3.5–5.1)
Sodium: 135 mmol/L (ref 135–145)

## 2016-03-31 MED ORDER — RIVAROXABAN 10 MG PO TABS: 10.0000 mg | ORAL_TABLET | Freq: Every day | ORAL | 0 refills | Status: DC

## 2016-03-31 MED ORDER — FENTANYL 12 MCG/HR TD PT72: 12.5000 ug | MEDICATED_PATCH | TRANSDERMAL | 0 refills | Status: DC

## 2016-03-31 MED ORDER — OXYCODONE HCL 5 MG PO TABS: 5.0000 mg | ORAL_TABLET | ORAL | 0 refills | Status: DC | PRN

## 2016-03-31 NOTE — Progress Notes (Signed)
Pt ready for discharge. Education/instructions reviewed with pt, and all questions/concerns addressed. IV removed and belongings gathered. Pt will be transported out via wheelchair to daughter's vehicle. Will continue to monitor

## 2016-03-31 NOTE — Progress Notes (Signed)
Patient placed on continuous pulse ox and 2L O2 due to frequent pain medication request. O2 stats 88% on room air patient encouraged to cough and deep breath. On 2L Stats level 93% [place] will continue to monitor. Arthor Captain LPN

## 2016-03-31 NOTE — Progress Notes (Addendum)
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 Pt is able to maintain PWB status with mobility.  Pain and limited activity tolerance limiting session. Continue to progress as tolerated.   Follow Up Recommendations  Home health PT     Equipment Recommendations  Rolling walker with 5" wheels;3in1 (PT) (may already have)    Recommendations for Other Services OT consult     Precautions / Restrictions Precautions Precautions: Knee Precaution Comments: reviewed precautions with pt Restrictions Weight Bearing Restrictions: Yes RLE Weight Bearing: Partial weight bearing RLE Partial Weight Bearing Percentage or Pounds: 50    Mobility  Bed Mobility               General bed mobility comments: pt OOB in chair upon arrival  Transfers Overall transfer level: Needs assistance Equipment used: Rolling walker (2 wheeled) Transfers: Sit to/from Stand Sit to Stand: Min assist         General transfer comment: assist to steady and power up into standing from recliner and BSC; cues for safe hand placement  Ambulation/Gait Ambulation/Gait assistance: Min guard Ambulation Distance (Feet): 26 Feet Assistive device: Rolling walker (2 wheeled) Gait Pattern/deviations: Step-to pattern;Decreased stance time - right;Decreased step length - left;Decreased stance time - left;Decreased weight shift to right Gait velocity: slow   General Gait Details: cues for safe proximity of RW and sequencing; pt able  to maintain PWB status   Stairs            Wheelchair Mobility    Modified Rankin (Stroke Patients Only)       Balance     Sitting balance-Leahy Scale: Fair       Standing balance-Leahy Scale: Poor                              Cognition Arousal/Alertness: Awake/alert Behavior During Therapy: WFL for tasks assessed/performed Overall Cognitive Status: Within Functional Limits for tasks assessed                                        Exercises      General Comments        Pertinent Vitals/Pain Pain Assessment: 0-10 Pain Score: 7  Pain Location: R knee Pain Descriptors / Indicators: Aching;Grimacing;Guarding Pain Intervention(s): Limited activity within patient's tolerance;Monitored during session;Premedicated before session;Repositioned    Home Living                      Prior Function            PT Goals (current goals can now be found in the care plan section) Acute Rehab PT Goals Patient Stated Goal: walk without pain PT Goal Formulation: With patient Time For Goal Achievement: 04/05/16 Potential to Achieve Goals: Good Progress towards PT goals: Progressing toward goals    Frequency    7X/week      PT Plan Current plan  remains appropriate    Co-evaluation             End of Session Equipment Utilized During Treatment: Gait belt Activity Tolerance: Patient limited by pain Patient left: in chair;with call bell/phone within reach;with family/visitor present Nurse Communication: Mobility status PT Visit Diagnosis: Other abnormalities of gait and mobility (R26.89);Pain Pain - Right/Left: Right Pain - part of body: Knee     Time: 7616-0737 PT Time Calculation (min) (ACUTE ONLY): 24 min  Charges:  $Gait Training: 8-22 mins $Therapeutic Activity: 8-22 mins                    G Codes:       Earney Navy, PTA Pager: [phone]     Darliss Cheney 03/31/2016, 2:15 PM

## 2016-03-31 NOTE — Progress Notes (Signed)
PATIENT ID: MANDI MATTIOLI        MRN:  [id number]          DOB/AGE: 1960/10/09 / 56 y.o.    [name], MD   [name], PA-C [address] [place], [place]  [address]                             [phone]   PROGRESS NOTE  Subjective:  negative for Chest Pain  negative for Shortness of Breath  negative for Nausea/Vomiting   negative for Calf Pain    Tolerating Diet: yes         Patient reports pain as mild.     Much more comfortable today  Objective: Vital signs in last 24 hours:   Patient Vitals for the past 24 hrs:  BP Temp Temp src Pulse Resp SpO2  03/31/16 0452 126/69 98.4 F (36.9 C) Oral 94 18 94 %  03/30/16 2011 (!) 148/85 98.1 F (36.7 C) Oral 96 16 97 %  03/30/16 1637 139/65 - - 85 - -  03/30/16 1300 136/65 - - 81 16 97 %      Intake/Output from previous day:   03/28 0701 - 03/29 0700 In: 720 [P.O.:720] Out: 950 [Urine:950]   Intake/Output this shift:   No intake/output data recorded.   Intake/Output      03/28 0701 - 03/29 0700 03/29 0701 - 03/30 0700   P.O. 720    I.V. (mL/kg)     IV Piggyback     Total Intake(mL/kg) 720 (10.4)    Urine (mL/kg/hr) 950 (0.6)    Blood     Total Output 950     Net -230          Urine Occurrence 1 x       LABORATORY DATA:  Recent Labs  03/30/16 0250 03/31/16 0755  WBC 8.5 10.2  HGB 9.7* 10.2*  HCT 31.8* 33.2*  PLT 162 191    Recent Labs  03/30/16 0250 03/31/16 0755  NA 139 135  K 4.7 4.1  CL 104 102  CO2 28 26  BUN 12 8  CREATININE 0.74 0.60  GLUCOSE 159* 135*  CALCIUM 9.0 9.1   Lab Results  Component Value Date   INR 1.09 03/22/2016    Recent Radiographic Studies :  Dg Chest 2 View  Result Date: 03/22/2016 CLINICAL DATA:  Preop knee replacement history of hypertension and coronary artery disease EXAM: CHEST  2 VIEW COMPARISON:  None. FINDINGS: Scarring or atelectasis in the right middle lobe. No pleural effusion or consolidation. Normal cardiomediastinal silhouette. No  pneumothorax. Surgical clips in the right upper quadrant. IMPRESSION: Linear scarring or atelectasis in the right middle lobe. No radiographic evidence for acute cardiopulmonary abnormality. Electronically Signed   By: Donavan Foil M.D.   On: 03/22/2016 13:58     Examination:  General appearance: alert, cooperative and no distress  Wound Exam: clean, dry, intact   Drainage:  None: wound tissue dry  Motor Exam: EHL, FHL, Anterior Tibial and Posterior Tibial Intact  Sensory Exam: Superficial Peroneal, Deep Peroneal and Tibial normal  Vascular Exam: Normal  Assessment:    2 Days Post-Op  Procedure(s) (LRB): TOTAL KNEE ARTHROPLASTY (Right)  ADDITIONAL DIAGNOSIS:  Active Problems:   Primary osteoarthritis of right knee  no new problems   Plan: Physical Therapy as ordered 50% WBing  DVT Prophylaxis:  Partial Weight Bearing @ 50% (PWB)  DISCHARGE  PLAN: Home  DISCHARGE NEEDS: HHPT, CPM, Walker and 3-in-1 comode seat Dressing changed--wound right knee clean and dry. Will discharge today       Garald Balding  03/31/2016 12:28 PM  Patient ID: [name], female   DOB: Dec 20, 1960, 56 y.o.   MRN: [id number]

## 2016-03-31 NOTE — Progress Notes (Signed)
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 Focus of session was bed mobility and functional transfers. Pt on RA during session with O2 sats 92-96. Per conversation with nurse left on RA and continuous pulse ox at end of session. D/c plan remains appropriate.   Follow Up Recommendations  Home health OT    Equipment Recommendations  3 in 1 bedside commode    Recommendations for Other Services      Precautions / Restrictions Precautions Precautions: Knee Restrictions Weight Bearing Restrictions: Yes RLE Weight Bearing: Partial weight bearing       Mobility Bed Mobility Overal bed mobility: Needs Assistance Bed Mobility: Supine to Sit     Supine to sit: Mod assist     General bed mobility comments: used leg lifter. extra time and effort. Pt reporting " I'm stuck, I can't get over this hump where the bed won't go all the way flat." Therapist utilizing linens under ischium to assist with bringing pt to full EOB position.   Transfers Overall transfer level: Needs assistance Equipment used: Rolling walker (2 wheeled) Transfers: Sit to/from Stand Sit to Stand: Min assist         General transfer comment: Pt initially kept RLE in NWB/TDWB position to come to standing. min A to steady.     Balance Overall balance assessment: Needs assistance Sitting-balance support: Single extremity supported;Feet supported Sitting balance-Leahy Scale: Fair     Standing balance support: Bilateral upper extremity  supported Standing balance-Leahy Scale: Poor                             ADL either performed or assessed with clinical judgement   ADL Overall ADL's : Needs assistance/impaired                         Toilet Transfer: Minimal assistance;Stand-pivot Toilet Transfer Details (indicate cue type and reason): Pt reports she needed "a lot of help" of 1 person to take pivotal steps to [facility] overnight.          Functional mobility during ADLs: Min guard;Minimal assistance;Rolling walker General ADL Comments: Mod A to scoot hips fully to EOB position during supine>EOB. Linens under hips utilized. Pt ultimately min A with pivotal steps to recliner this session. Slow, guarded, painful during SPT. Pt reports spasms decreased once sitting in recliner.      Vision       Perception     Praxis      Cognition Arousal/Alertness: Awake/alert Behavior During Therapy: WFL for tasks assessed/performed Overall Cognitive Status: Within Functional Limits for tasks assessed                                          Exercises     Shoulder Instructions       General Comments Pt on RA during session with O2 sats 92-96. Left on RA with continuous pulse ox  in place at end of session per conversation with nurse.     Pertinent Vitals/ Pain       Pain Assessment: Faces Faces Pain Scale: Hurts whole lot Pain Location: R knee, increases with movement, decreases with rest Pain Descriptors / Indicators: Spasm;Grimacing Pain Intervention(s): Limited activity within patient's tolerance;Monitored during session;Premedicated before session;Repositioned  Home Living                                          Prior Functioning/Environment              Frequency  Min 2X/week        Progress Toward Goals  OT Goals(current goals can now be found in the care plan section)  Progress towards OT goals: Progressing toward goals  Acute Rehab OT  Goals Patient Stated Goal: walk without pain OT Goal Formulation: With patient Time For Goal Achievement: 04/13/16 Potential to Achieve Goals: Good ADL Goals Pt Will Perform Lower Body Bathing: with min guard assist;sit to/from stand;with adaptive equipment Pt Will Transfer to Toilet: with min guard assist;bedside commode;ambulating Additional ADL Goal #1: Pt will complete bed mobility supervision level   Plan Discharge plan remains appropriate    Co-evaluation                 End of Session Equipment Utilized During Treatment: Gait belt;Rolling walker  OT Visit Diagnosis: Unsteadiness on feet (R26.81)   Activity Tolerance Patient limited by pain   Patient Left with call bell/phone within reach;in chair   Nurse Communication Other (comment) (O2 sats on RA)        Time: 7915-0569 OT Time Calculation (min): 27 min  Charges: OT General Charges $OT Visit: 1 Procedure OT Treatments $Self Care/Home Management : 23-37 mins     Hortencia Pilar 03/31/2016, 9:30 AM

## 2016-03-31 NOTE — Discharge Summary (Signed)
Shannon Fears, MD   Shannon Borg, PA-C [address], [place], [place]  [address]                             [phone]  PATIENT ID: Shannon Jennings        MRN:  [id number]          DOB/AGE: 02/06/1960 / 56 y.o.    DISCHARGE SUMMARY  ADMISSION DATE:    03/29/2016 DISCHARGE DATE:   03/31/2016   ADMISSION DIAGNOSIS: RIGHT KNEE OSETOARTHRITIS    DISCHARGE DIAGNOSIS:  RIGHT KNEE OSETOARTHRITIS    ADDITIONAL DIAGNOSIS: Active Problems:   Primary osteoarthritis of right knee  Past Medical History:  Diagnosis Date  . Abnormally small mouth   . Complication of anesthesia   . Coronary artery disease   . 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 Difficult intubation 07/2012  . Fibromyalgia   . High cholesterol   . History of Graves' disease   . History of migraine   . Hypertension    under control with med., has been on med. x 4 yr.  . Hypothyroidism   . Osteoarthritis   . S/P coronary artery stent placement 04/2011   states right coronary artery  . Thalassemia minor     PROCEDURE: Procedure(s): TOTAL KNEE ARTHROPLASTY Right on 03/29/2016  CONSULTS: none    HISTORY: Shannon Jennings, 56 y.o. female, has a history of pain and functional disability in the right knee due to arthritis and has failed non-surgical conservative treatments for greater than 12 weeks to includeNSAID's and/or analgesics, corticosteriod injections, viscosupplementation injections, flexibility and strengthening excercises, weight reduction as appropriate and activity modification.  Onset of symptoms was gradual, starting >10 years ago with gradually worsening course since that time. The patient noted no past surgery on the right knee(s).  Patient currently rates pain in the right knee(s) at 8 out of 10 with activity. Patient has night pain, worsening of pain with activity and weight bearing, pain that interferes with activities of daily living, pain with passive range of  motion, crepitus and joint swelling.  Patient has evidence of subchondral sclerosis, periarticular osteophytes and joint space narrowing by imaging studies.  There is no active infection.   HOSPITAL COURSE:  Shannon Jennings is a 56 y.o. admitted on 03/29/2016 and found to have a diagnosis of [place].  After appropriate laboratory studies were obtained  they were taken to the operating room on 03/29/2016 and underwent  Procedure(s): TOTAL KNEE ARTHROPLASTY  Right.   They were given perioperative antibiotics:  Anti-infectives    Start     Dose/Rate Route Frequency Ordered Stop   03/29/16 1330  ceFAZolin (ANCEF) IVPB 2g/100 mL premix     2 g 200 mL/hr over 30 Minutes Intravenous Every 6 hours 03/29/16 0956 03/29/16 2159   03/29/16 0630  vancomycin (VANCOCIN) IVPB 1000 mg/200 mL premix     1,000 mg 200 mL/hr over 60 Minutes Intravenous To Surgery 03/28/16 1103 03/29/16 0710    .  Tolerated the procedure well.  Placed with a foley intraoperatively.    Toradol was given post op.  POD #1, allowed out of bed to a chair.  PT for ambulation and exercise program.  Foley D/C'd in morning.  IV saline locked.  O2 discontionued.  POD #2, continued PT and ambulation.     The remainder of the hospital course  was dedicated to ambulation and strengthening.   The patient was discharged on 2 Days Post-Op in  Stable condition.  Blood products given:none  DIAGNOSTIC STUDIES: Recent vital signs: Patient Vitals for the past 24 hrs:  BP Temp Temp src Pulse Resp SpO2  03/31/16 0452 126/69 98.4 F (36.9 C) Oral 94 18 94 %  03/30/16 2011 (!) 148/85 98.1 F (36.7 C) Oral 96 16 97 %  03/30/16 1637 139/65 - - 85 - -  03/30/16 1300 136/65 - - 81 16 97 %       Recent laboratory studies:  Recent Labs  03/30/16 0250 03/31/16 0755  WBC 8.5 10.2  HGB 9.7* 10.2*  HCT 31.8* 33.2*  PLT 162 191    Recent Labs  03/30/16 0250 03/31/16 0755  NA 139 135  K 4.7 4.1  CL 104 102  CO2 28 26    BUN 12 8  CREATININE 0.74 0.60  GLUCOSE 159* 135*  CALCIUM 9.0 9.1   Lab Results  Component Value Date   INR 1.09 03/22/2016     Recent Radiographic Studies :  Dg Chest 2 View  Result Date: 03/22/2016 CLINICAL DATA:  Preop knee replacement history of hypertension and coronary artery disease EXAM: CHEST  2 VIEW COMPARISON:  None. FINDINGS: Scarring or atelectasis in the right middle lobe. No pleural effusion or consolidation. Normal cardiomediastinal silhouette. No pneumothorax. Surgical clips in the right upper quadrant. IMPRESSION: Linear scarring or atelectasis in the right middle lobe. No radiographic evidence for acute cardiopulmonary abnormality. Electronically Signed   By: Donavan Foil M.D.   On: 03/22/2016 13:58    DISCHARGE INSTRUCTIONS: Discharge Instructions    CPM    Complete by:  As directed    Continuous passive motion machine (CPM):      Use the CPM from 0 to 60 for 6-8 hours per day.      You may increase by 5-10 degrees per day.  You may break it up into 2 or 3 sessions per day.      Use CPM for 3-4  weeks or until you are told to stop.   Call MD / Call 911    Complete by:  As directed    If you experience chest pain or shortness of breath, CALL 911 and be transported to the hospital emergency room.  If you develope a fever above 101 F, pus (white drainage) or increased drainage or redness at the wound, or calf pain, call your surgeon's office.   Change dressing    Complete by:  As directed    DO NOT CHANGE YOUR DRESSING   Constipation Prevention    Complete by:  As directed    Drink plenty of fluids.  Prune juice may be helpful.  You may use a stool softener, such as Colace (over the counter) 100 mg twice a day.  Use MiraLax (over the counter) for constipation as needed.   Diet general    Complete by:  As directed    Discharge instructions    Complete by:  As directed    [place] items at home which could result in a fall.  This includes throw rugs or furniture in walking pathways ICE to the affected joint every three hours while awake for 30 minutes at a time, for at least the first 3-5 days, and then as needed for pain and swelling.  Continue to use ice for pain and swelling. You may notice swelling that  will progress down to the foot and ankle.  This is normal after surgery.  Elevate your leg when you are not up walking on it.   Continue to use the breathing machine you got in the hospital (incentive spirometer) which will help keep your temperature down.  It is common for your temperature to cycle up and down following surgery, especially at night when you are not up moving around and exerting yourself.  The breathing machine keeps your lungs expanded and your temperature down.   DIET:  As you were doing prior to hospitalization, we recommend a well-balanced diet.  DRESSING / WOUND CARE / SHOWERING  Keep the surgical dressing until follow up.  The dressing is water proof, so you can shower without any extra covering.  IF THE DRESSING FALLS OFF or the wound gets wet inside, change the dressing with sterile gauze.  Please use good hand washing techniques before changing the dressing.  Do not use any lotions or creams on the incision until instructed by your surgeon.    ACTIVITY  Increase activity slowly as tolerated, but follow the weight bearing instructions below.   No driving for 6 weeks or until further direction given by your physician.  You cannot drive while taking narcotics.  No lifting or carrying greater than 10 lbs. until further directed by your surgeon. Avoid periods of inactivity such as sitting longer than an hour when not asleep. This helps prevent blood clots.  You may return to work once you are authorized by your doctor.     WEIGHT BEARING   Partial weight bearing with assist device as directed.  50%   EXERCISES  Results after joint replacement surgery are often greatly improved when you  follow the exercise, range of motion and muscle strengthening exercises prescribed by your doctor. Safety measures are also important to protect the joint from further injury. Any time any of these exercises cause you to have increased pain or swelling, decrease what you are doing until you are comfortable again and then slowly increase them. If you have problems or questions, call your caregiver or physical therapist for advice.   Rehabilitation is important following a joint replacement. After just a few days of immobilization, the muscles of the leg can become weakened and shrink (atrophy).  These exercises are designed to build up the tone and strength of the thigh and leg muscles and to improve motion. Often times heat used for twenty to thirty minutes before working out will loosen up your tissues and help with improving the range of motion but do not use heat for the first two weeks following surgery (sometimes heat can increase post-operative swelling).   These exercises can be done on a training (exercise) mat, on the floor, on a table or on a bed. Use whatever works the best and is most comfortable for you.    Use music or television while you are exercising so that the exercises are a pleasant break in your day. This will make your life better with the exercises acting as a break in your routine that you can look forward to.   Perform all exercises about fifteen times, three times per day or as directed.  You should exercise both the operative leg and the other leg as well.   Exercises include:  Quad Sets - Tighten up the muscle on the front of the thigh (Quad) and hold for 5-10 seconds.   Straight Leg Raises - With your knee straight (if you  were given a brace, keep it on), lift the leg to 60 degrees, hold for 3 seconds, and slowly lower the leg.  Perform this exercise against resistance later as your leg gets stronger.  Leg Slides: Lying on your back, slowly slide your foot toward your  buttocks, bending your knee up off the floor (only go as far as is comfortable). Then slowly slide your foot back down until your leg is flat on the floor again.  Angel Wings: Lying on your back spread your legs to the side as far apart as you can without causing discomfort.  Hamstring Strength:  Lying on your back, push your heel against the floor with your leg straight by tightening up the muscles of your buttocks.  Repeat, but this time bend your knee to a comfortable angle, and push your heel against the floor.  You may put a pillow under the heel to make it more comfortable if necessary.   A rehabilitation program following joint replacement surgery can speed recovery and prevent re-injury in the future due to weakened muscles. Contact your doctor or a physical therapist for more information on knee rehabilitation.    CONSTIPATION  Constipation is defined medically as fewer than three stools per week and severe constipation as less than one stool per week.  Even if you have a regular bowel pattern at home, your normal regimen is likely to be disrupted due to multiple reasons following surgery.  Combination of anesthesia, postoperative narcotics, change in appetite and fluid intake all can affect your bowels.   YOU MUST use at least one of the following options; they are listed in order of increasing strength to get the job done.  They are all available over the counter, and you may need to use some, POSSIBLY even all of these options:    Drink plenty of fluids (prune juice may be helpful) and high fiber foods Colace 100 mg by mouth twice a day  Senokot for constipation as directed and as needed Dulcolax (bisacodyl), take with full glass of water  Miralax (polyethylene glycol) once or twice a day as needed.  If you have tried all these things and are unable to have a bowel movement in the first 3-4 days after surgery call either your surgeon or your primary doctor.    If you experience loose  stools or diarrhea, hold the medications until you stool forms back up.  If your symptoms do not get better within 1 week or if they get worse, check with your doctor.  If you experience "the worst abdominal pain ever" or develop nausea or vomiting, please contact the office immediately for further recommendations for treatment.   ITCHING:  If you experience itching with your medications, try taking only a single pain pill, or even half a pain pill at a time.  You can also use Benadryl over the counter for itching or also to help with sleep.   TED HOSE STOCKINGS:  Use stockings on both legs until for at least 2 weeks or as directed by physician office. They may be removed at night for sleeping.  MEDICATIONS:  See your medication summary on the "After Visit Summary" that nursing will review with you.  You may have some home medications which will be placed on hold until you complete the course of blood thinner medication.  It is important for you to complete the blood thinner medication as prescribed.  PRECAUTIONS:  If you experience chest pain or shortness of breath -  call 911 immediately for transfer to the hospital emergency department.   If you develop a fever greater that 101 F, purulent drainage from wound, increased redness or drainage from wound, foul odor from the wound/dressing, or calf pain - CONTACT YOUR SURGEON.                                                   FOLLOW-UP APPOINTMENTS:  If you do not already have a post-op appointment, please call the office for an appointment to be seen by your surgeon.  Guidelines for how soon to be seen are listed in your "After Visit Summary", but are typically between 1-4 weeks after surgery.  OTHER INSTRUCTIONS:   Knee Replacement:  Do not place pillow under knee, focus on keeping the knee straight while resting. CPM instructions: 0-90 degrees, 2 hours in the morning, 2 hours in the afternoon, and 2 hours in the evening. Place foam block, curve side  up under heel at all times except when in CPM or when walking.  DO NOT modify, tear, cut, or change the foam block in any way.  MAKE SURE YOU:  Understand these instructions.  Get help right away if you are not doing well or get worse.    Thank you for letting us be a part of your medical care team.  It is a privilege we respect greatly.  We hope these instructions will help you stay on track for a fast and full recovery!   Do not put a pillow under the knee. Place it under the heel.    Complete by:  As directed    Driving restrictions    Complete by:  As directed    No driving for 6 weeks   Increase activity slowly as tolerated    Complete by:  As directed    Lifting restrictions    Complete by:  As directed    No lifting for 6 weeks   Partial weight bearing    Complete by:  As directed    % Body Weight:  50%   Laterality:  right   Extremity:  Lower   Patient may shower    Complete by:  As directed    You may shower over the brown dressing   TED hose    Complete by:  As directed    Use stockings (TED hose) for 2-3 weeks on right leg.  You may remove them at night for sleeping.      DISCHARGE MEDICATIONS:   Allergies as of 03/31/2016      Reactions   Dilaudid [hydromorphone Hcl] Other (See Comments)   manic   Penicillins Rash   Has patient had a PCN reaction causing immediate rash, facial/tongue/throat swelling, SOB or lightheadedness with hypotension: No Has patient had a PCN reaction causing severe rash involving mucus membranes or skin necrosis: unknown Has patient had a PCN reaction that required hospitalization No Has patient had a PCN reaction occurring within the last 10 years: No If all of the above answers are "NO", then may proceed with Cephalosporin use.   Sulfa Antibiotics Rash      Medication List    STOP taking these medications   aspirin EC 81 MG tablet   methocarbamol 500 MG tablet Commonly known as:  ROBAXIN   traMADol 50 MG tablet Commonly known  as:  ULTRAM     TAKE these medications   atorvastatin 80 MG tablet Commonly known as:  LIPITOR Take 80 mg by mouth daily.   celecoxib 200 MG capsule Commonly known as:  CELEBREX TAKE ONE CAPSULE BY MOUTH ONCE DAILY WITH FOOD What changed:  how much to take  how to take this  when to take this  additional instructions   cyclobenzaprine 10 MG tablet Commonly known as:  FLEXERIL TAKE 1 TABLET BY MOUTH AT BEDTIME AS NEEDED What changed:  See the new instructions.   docusate sodium 100 MG capsule Commonly known as:  COLACE Take 200 mg by mouth every evening.   fentaNYL 12 MCG/HR Commonly known as:  DURAGESIC - dosed mcg/hr Place 1 patch (12.5 mcg total) onto the skin every 3 (three) days. Start taking on:  04/02/2016   HEMOCYTE PLUS 106-1 MG Caps Take 1 capsule by mouth every evening.   levothyroxine 112 MCG tablet Commonly known as:  SYNTHROID, LEVOTHROID Take 112 mcg by mouth daily before breakfast.   metoprolol 50 MG tablet Commonly known as:  LOPRESSOR Take 50 mg by mouth 2 (two) times daily with a meal.   oxyCODONE 5 MG immediate release tablet Commonly known as:  Oxy IR/ROXICODONE Take 1-2 tablets (5-10 mg total) by mouth every 4 (four) hours as needed for breakthrough pain.   rivaroxaban 10 MG Tabs tablet Commonly known as:  XARELTO Take 1 tablet (10 mg total) by mouth daily with breakfast. Start taking on:  04/01/2016   SUMAtriptan 50 MG tablet Commonly known as:  IMITREX Take 25 mg by mouth every 2 (two) hours as needed for migraine.   Vitamin D 2000 units Caps Take 2,000 Units by mouth daily.   zolpidem 5 MG tablet Commonly known as:  AMBIEN Take 1 tablet (5 mg total) by mouth at bedtime as needed for sleep.            Durable Medical Equipment        Start     Ordered   03/29/16 1248  DME Walker rolling  Once    Question:  Patient needs a walker to treat with the following condition  Answer:  S/P total knee arthroplasty, right    03/29/16 1248   03/29/16 1248  DME 3 n 1  Once     03/29/16 1248   03/29/16 1248  DME Bedside commode  Once    Question:  Patient needs a bedside commode to treat with the following condition  Answer:  S/P TKR (total knee replacement) using cement, right   03/29/16 1248      FOLLOW UP VISIT:   Follow-up Information    Interim Follow up.   Why:  A representative from [facility] will contact you to arrange start date and time for therapy. Contact information: [phone]       Garald Balding, MD Follow up on 04/11/2016.   Specialty:  Orthopedic Surgery Contact information: [address] [address] [phone]           DISPOSITION:   Home  CONDITION:  Stable   Mike Craze. [place], [place] [phone]  03/31/2016 12:25 PM

## 2016-03-31 NOTE — Progress Notes (Signed)
qPhysical Therapy Treatment Patient Details Name: Shannon Jennings MRN: [id number] DOB: 07-20-1960 Today's Date: 03/31/2016    History of Present Illness Admitted for RTKA, 50%PWB;  has a pertinent past medical history of  Complication of anesthesia; Coronary artery disease; Degenerative joint disease of hand (12/2012);Fibromyalgia;  History of Graves' disease;  has a pertinent  past surgical history that includes Distal interphalangeal joint fusion  Hand surgeries    PT Comments    Patient declined ambulation this session. Session focused on ROM, strengthening, and HEP handout review. Pt required assistance to perform all LE therex due to poor R quad activation and pain. Continue to progress as tolerated with anticipated d/c home with HHPT. 2   Follow Up Recommendations  Home health PT     Equipment Recommendations  Rolling walker with 5" wheels;3in1 (PT) (may already have)    Recommendations for Other Services OT consult     Precautions / Restrictions Precautions Precautions: Knee Precaution Comments: reviewed precautions with pt Restrictions Weight Bearing Restrictions: Yes RLE Weight Bearing: Partial weight bearing RLE Partial Weight Bearing Percentage or Pounds: 50    Mobility  Bed Mobility               General bed mobility comments: pt OOB in chair upon arrival  Transfers Overall transfer level: Needs assistance Equipment used: Rolling walker (2 wheeled) Transfers: Sit to/from Stand Sit to Stand: Min assist         General transfer comment: assist to steady and power up into standing from recliner and BSC; cues for safe hand placement  Ambulation/Gait Ambulation/Gait assistance: Min guard Ambulation Distance (Feet): 26 Feet Assistive device: Rolling walker (2 wheeled) Gait Pattern/deviations: Step-to pattern;Decreased stance time - right;Decreased step length - left;Decreased stance time - left;Decreased weight shift to right Gait velocity: slow    General Gait Details: pt declined ambulation    Stairs            Wheelchair Mobility    Modified Rankin (Stroke Patients Only)       Balance     Sitting balance-Leahy Scale: Fair       Standing balance-Leahy Scale: Poor                              Cognition Arousal/Alertness: Awake/alert Behavior During Therapy: WFL for tasks assessed/performed Overall Cognitive Status: Within Functional Limits for tasks assessed                                        Exercises Total Joint Exercises Quad Sets: AROM;Right;10 reps Short Arc QuadSinclair Ship;Right;10 reps Heel Slides: AAROM;Right;10 reps Hip ABduction/ADduction: AAROM;Right;10 reps Straight Leg Raises: AAROM;Right;10 reps Long Arc Quad: AAROM;Right;10 reps Knee Flexion: AROM;AAROM;Right;5 reps;Other (comment);Seated (10 sec holds) Goniometric ROM: 5-40    General Comments        Pertinent Vitals/Pain Pain Assessment: Faces Pain Score: 7  Faces Pain Scale: Hurts even more Pain Location: R knee Pain Descriptors / Indicators: Aching;Grimacing;Guarding Pain Intervention(s): Limited activity within patient's tolerance;Monitored during session;Premedicated before session;Repositioned;Ice applied    Home Living                      Prior Function            PT Goals (current goals can now be found in the care plan section) Acute Rehab  PT Goals Patient Stated Goal: walk without pain PT Goal Formulation: With patient Time For Goal Achievement: 04/05/16 Potential to Achieve Goals: Good Progress towards PT goals: Progressing toward goals    Frequency    7X/week      PT Plan Current plan remains appropriate    Co-evaluation             End of Session Equipment Utilized During Treatment: Gait belt Activity Tolerance: Patient limited by pain Patient left: in chair;with call bell/phone within reach;with family/visitor present Nurse Communication: Mobility  status PT Visit Diagnosis: Other abnormalities of gait and mobility (R26.89);Pain Pain - Right/Left: Right Pain - part of body: Knee     Time: 3128-1188 PT Time Calculation (min) (ACUTE ONLY): 30 min  Charges: $Therapeutic Exercise: 23-37 mins                     G Codes:       Earney Navy, PTA Pager: [phone]     Darliss Cheney 03/31/2016, 3:36 PM

## 2016-03-31 NOTE — Progress Notes (Signed)
Orthopedic Tech Progress Note Patient Details:  Shannon Jennings 23-May-1960 [id number]  CPM Right Knee CPM Right Knee: On Right Knee Flexion (Degrees): 40 Right Knee Extension (Degrees): 0 Additional Comments: [email or web address] bar patient helper   [place] Pink 03/31/2016, 3:48 PM

## 2016-04-06 ENCOUNTER — Other Ambulatory Visit: Payer: Self-pay | Admitting: Rheumatology

## 2016-04-06 NOTE — Telephone Encounter (Signed)
ok 

## 2016-04-06 NOTE — Telephone Encounter (Signed)
Last Visit: 01/22/16 Next Visit: 07/21/16 UDS: 02/01/16 Narc Agreement: 08/20/15  Okay to refill Tramadol?

## 2016-04-11 ENCOUNTER — Inpatient Hospital Stay (INDEPENDENT_AMBULATORY_CARE_PROVIDER_SITE_OTHER): Payer: BLUE CROSS/BLUE SHIELD | Admitting: Orthopaedic Surgery

## 2016-04-12 ENCOUNTER — Ambulatory Visit (INDEPENDENT_AMBULATORY_CARE_PROVIDER_SITE_OTHER): Payer: BLUE CROSS/BLUE SHIELD | Admitting: Orthopaedic Surgery

## 2016-04-12 ENCOUNTER — Encounter (INDEPENDENT_AMBULATORY_CARE_PROVIDER_SITE_OTHER): Payer: Self-pay | Admitting: Orthopaedic Surgery

## 2016-04-12 ENCOUNTER — Ambulatory Visit (INDEPENDENT_AMBULATORY_CARE_PROVIDER_SITE_OTHER): Payer: Self-pay

## 2016-04-12 ENCOUNTER — Inpatient Hospital Stay (INDEPENDENT_AMBULATORY_CARE_PROVIDER_SITE_OTHER): Payer: BLUE CROSS/BLUE SHIELD | Admitting: Orthopaedic Surgery

## 2016-04-12 VITALS — BP 117/69 | HR 75 | Ht 64.0 in | Wt 152.0 lb

## 2016-04-12 DIAGNOSIS — M25562 Pain in left knee: Secondary | ICD-10-CM

## 2016-04-12 DIAGNOSIS — Z96651 Presence of right artificial knee joint: Secondary | ICD-10-CM | POA: Insufficient documentation

## 2016-04-12 DIAGNOSIS — G8929 Other chronic pain: Secondary | ICD-10-CM | POA: Insufficient documentation

## 2016-04-12 MED ORDER — OXYCODONE HCL 5 MG PO CAPS: 5.0000 mg | ORAL_CAPSULE | ORAL | 0 refills | Status: DC | PRN

## 2016-04-12 NOTE — Progress Notes (Signed)
Office Visit Note   Patient: Shannon Jennings           Date of Birth: 09/29/1960           MRN: [id number] Visit Date: 04/12/2016              Requested by: Delanna Notice, [place] [address] PCP: Delanna Notice, MD   Assessment & Plan: Visit Diagnoses:  1. S/P total knee replacement using cement, right   2. Chronic pain of left knee     Plan:  #1: Prescription for oxycodone was given #2: Physical therapy #3: Weight bearing as tolerated    Follow-Up Instructions: Return in about 2 weeks (around 04/26/2016).   Orders:  Orders Placed This Encounter  Procedures  . XR KNEE 3 VIEW RIGHT   Meds ordered this encounter  Medications  . oxycodone (OXY-IR) 5 MG capsule    Sig: Take 1-2 capsules (5-10 mg total) by mouth every 4 (four) hours as needed.    Dispense:  60 capsule    Refill:  0    Order Specific Question:   Supervising Provider    Answer:   Garald Balding [[id number]]      Procedures: No procedures performed   Clinical Data: No additional findings.   Subjective: Chief Complaint  Patient presents with  . Left Knee - Routine Post Op  . Routine Post Op    2 week status post left knee arthroplasty    Shannon Jennings is seen today now 2 weeks status post right total knee arthroplasty. She's doing well overall. Calf pain. She still is requiring use of oxycodone. Still complains of occasional spasms especially in the quad area. Overall though is ambulatory with a walker and doing well.    Review of Systems  Constitutional: Negative.   HENT: Negative.   Respiratory: Negative.   Cardiovascular: Negative.   Gastrointestinal: Negative.   Genitourinary: Negative.   Skin: Negative.   Neurological: Negative.   Hematological: Negative.   Psychiatric/Behavioral: Negative.      Objective: Vital Signs: BP 117/69   Pulse 75   Ht 5\' 4"  (1.626 m)   Wt 152 lb (68.9 kg)   BMI 26.09 kg/m   Physical Exam  Constitutional: She is oriented to person, place, and  time. She appears well-developed and well-nourished.  HENT:  Head: Normocephalic and atraumatic.  Eyes: EOM are normal. Pupils are equal, round, and reactive to light.  Pulmonary/Chest: Effort normal.  Musculoskeletal:       Right knee: She exhibits effusion (trace to 1+).  Neurological: She is alert and oriented to person, place, and time.  Skin: Skin is warm and dry.  Psychiatric: She has a normal mood and affect. Her behavior is normal. Judgment and thought content normal.    Right Knee Exam   Range of Motion  Right knee extension: 3.  Right knee flexion: 95.   Tests  Varus: negative Valgus: negative  Other  Other tests: effusion (trace to 1+) present  Comments:  Ligamentously stable. Calf supple nontender. She intact distally.      Specialty Comments:  No specialty comments available.  Imaging: No results found.   PMFS History: Patient Active Problem List   Diagnosis Date Noted  . Chronic pain of left knee 04/12/2016  . S/P total knee replacement using cement, right 04/12/2016  . Primary osteoarthritis of right knee 03/16/2016  . Pre-op evaluation 03/16/2016  . Fibromyalgia 01/22/2016  . Primary osteoarthritis of both hands 01/22/2016  .  Primary osteoarthritis of both knees 01/22/2016  . Essential hypertension 01/22/2016  . History of coronary artery disease 01/22/2016  . History of Graves' disease 01/22/2016  . Dyslipidemia 01/22/2016  . History of thalassemia minor 01/22/2016   Past Medical History:  Diagnosis Date  . Abnormally small mouth   . Complication of anesthesia   . Coronary artery disease   . 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 Difficult intubation 07/2012  . Fibromyalgia   . High cholesterol   . History of Graves' disease   . History of migraine   . Hypertension    under control with med., has been on med. x 4 yr.  . Hypothyroidism   . Osteoarthritis   . S/P coronary artery stent placement  04/2011   states right coronary artery  . Thalassemia minor     Family History  Problem Relation Age of Onset  . Heart disease Mother   . Arthritis Mother   . Cancer Sister   . Seizures Sister   . Arthritis Brother   . Diabetes Brother     Past Surgical History:  Procedure Laterality Date  . ABDOMINAL HERNIA REPAIR  04/2012  . ABDOMINAL SURGERY  01/2011   for intestinal blockage  . APPENDECTOMY  1984  . CARDIAC CATHETERIZATION    . CHOLECYSTECTOMY  07/2012  . COLONOSCOPY    . CORONARY ANGIOPLASTY  04/2011   stent x 1  . DISTAL INTERPHALANGEAL JOINT FUSION Bilateral 12/25/2012   Procedure: DISTAL INTERPHALANGEAL JOINT FUSION RIGHT AND LEFT SMALL FINGER;  Surgeon: Cammie Sickle., MD;  Location: [facility];  Service: Orthopedics;  Laterality: Bilateral;  left and right small finger  . ESOPHAGOGASTRODUODENOSCOPY    . HAND SURGERY  2014   Bilateral pinky finger distal joint fusion  . HAND SURGERY  2016   Right Thumb reconstruction and left middle finger distal joint fusion  . HAND SURGERY     Scar tissue removal right palm  . OVARIAN CYST REMOVAL    . TOTAL KNEE ARTHROPLASTY Right 03/29/2016  . TOTAL KNEE ARTHROPLASTY Right 03/29/2016   Procedure: TOTAL KNEE ARTHROPLASTY;  Surgeon: Garald Balding, MD;  Location: [facility];  Service: Orthopedics;  Laterality: Right;  . TUBAL LIGATION Bilateral 1993  . WISDOM TOOTH EXTRACTION     Social History   Occupational History  . Not on file.   Social History Main Topics  . Smoking status: Never Smoker  . Smokeless tobacco: Never Used  . Alcohol use No  . Drug use: No  . Sexual activity: Not on file

## 2016-04-14 ENCOUNTER — Telehealth (INDEPENDENT_AMBULATORY_CARE_PROVIDER_SITE_OTHER): Payer: Self-pay | Admitting: Orthopaedic Surgery

## 2016-04-14 NOTE — Telephone Encounter (Signed)
Shannon Jennings from [facility] called needing to get outpatient therapy orders for the patient.  The patient is coming tomorrow to start her outpatient therapy.  CB#[phone], ext [phone].  Fax [phone].  Thank you.

## 2016-04-15 NOTE — Telephone Encounter (Signed)
Faxed info

## 2016-04-27 ENCOUNTER — Ambulatory Visit (INDEPENDENT_AMBULATORY_CARE_PROVIDER_SITE_OTHER): Payer: BLUE CROSS/BLUE SHIELD | Admitting: Orthopedic Surgery

## 2016-05-05 ENCOUNTER — Ambulatory Visit (INDEPENDENT_AMBULATORY_CARE_PROVIDER_SITE_OTHER): Payer: BLUE CROSS/BLUE SHIELD | Admitting: Orthopaedic Surgery

## 2016-05-05 DIAGNOSIS — Z96651 Presence of right artificial knee joint: Secondary | ICD-10-CM

## 2016-05-05 NOTE — Progress Notes (Signed)
Office Visit Note   Patient: Shannon Jennings           Date of Birth: 07/31/1960           MRN: [id number] Visit Date: 05/05/2016              Requested by: Delanna Notice, [place] [address] PCP: Delanna Notice, MD   Assessment & Plan: Visit Diagnoses:  1. History of total right knee replacement    5 weeks status post primary right total knee replacement doing quite well. Plan: Encourage exercises, vitamin E to incision, follow-up 3 months  Follow-Up Instructions: Return in about 3 months (around 08/05/2016).   Orders:  No orders of the defined types were placed in this encounter.  No orders of the defined types were placed in this encounter.     Procedures: No procedures performed   Clinical Data: No additional findings.   Subjective: Chief Complaint  Patient presents with  . Right Knee - Routine Post Op  Shannon Jennings is doing quite well 5 weeks status post primary right total knee replacement. She's very happy with her result. She's not using any ambulatory aid. She's taking tramadol for her fibromyalgia as she was preoperatively. She finishes her physical therapy in the next 1-2 weeks and then will have a home exercise program. [name] consider return to work around June 1 and we'll give her a note to that effect. If there is any concern about that particular date we can always extend it. HPI  Review of Systems   Objective: Vital Signs: There were no vitals taken for this visit.  Physical Exam  Ortho Exam right knee exam without effusion. Incision healing very nicely without any localized areas of tenderness. No opening with a varus valgus stress. Negative anterior drawer sign. Full extension 109 of flexion with a goniometer. No distal edema. Neurovascular exam intact. Patient walks without a limp  Specialty Comments:  No specialty comments available.  Imaging: No results found.   PMFS History: Patient Active Problem List   Diagnosis Date Noted   . Chronic pain of left knee 04/12/2016  . S/P total knee replacement using cement, right 04/12/2016  . Primary osteoarthritis of right knee 03/16/2016  . Pre-op evaluation 03/16/2016  . Fibromyalgia 01/22/2016  . Primary osteoarthritis of both hands 01/22/2016  . Primary osteoarthritis of both knees 01/22/2016  . Essential hypertension 01/22/2016  . History of coronary artery disease 01/22/2016  . History of Graves' disease 01/22/2016  . Dyslipidemia 01/22/2016  . History of thalassemia minor 01/22/2016   Past Medical History:  Diagnosis Date  . Abnormally small mouth   . Complication of anesthesia   . Coronary artery disease   . 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 Difficult intubation 07/2012  . Fibromyalgia   . High cholesterol   . History of Graves' disease   . History of migraine   . Hypertension    under control with med., has been on med. x 4 yr.  . Hypothyroidism   . Osteoarthritis   . S/P coronary artery stent placement 04/2011   states right coronary artery  . Thalassemia minor     Family History  Problem Relation Age of Onset  . Heart disease Mother   . Arthritis Mother   . Cancer Sister   . Seizures Sister   . Arthritis Brother   . Diabetes Brother     Past Surgical History:  Procedure Laterality Date  . ABDOMINAL HERNIA REPAIR  04/2012  . ABDOMINAL SURGERY  01/2011   for intestinal blockage  . APPENDECTOMY  1984  . CARDIAC CATHETERIZATION    . CHOLECYSTECTOMY  07/2012  . COLONOSCOPY    . CORONARY ANGIOPLASTY  04/2011   stent x 1  . DISTAL INTERPHALANGEAL JOINT FUSION Bilateral 12/25/2012   Procedure: DISTAL INTERPHALANGEAL JOINT FUSION RIGHT AND LEFT SMALL FINGER;  Surgeon: Cammie Sickle., MD;  Location: [facility];  Service: Orthopedics;  Laterality: Bilateral;  left and right small finger  . ESOPHAGOGASTRODUODENOSCOPY    . HAND SURGERY  2014   Bilateral pinky finger distal joint fusion  .  HAND SURGERY  2016   Right Thumb reconstruction and left middle finger distal joint fusion  . HAND SURGERY     Scar tissue removal right palm  . OVARIAN CYST REMOVAL    . TOTAL KNEE ARTHROPLASTY Right 03/29/2016  . TOTAL KNEE ARTHROPLASTY Right 03/29/2016   Procedure: TOTAL KNEE ARTHROPLASTY;  Surgeon: Garald Balding, MD;  Location: [facility];  Service: Orthopedics;  Laterality: Right;  . TUBAL LIGATION Bilateral 1993  . WISDOM TOOTH EXTRACTION     Social History   Occupational History  . Not on file.   Social History Main Topics  . Smoking status: Never Smoker  . Smokeless tobacco: Never Used  . Alcohol use No  . Drug use: No  . Sexual activity: Not on file     Garald Balding, MD   Note - This record has been created using [employer].  Chart creation errors have been sought, but may not always  have been located. Such creation errors do not reflect on  the standard of medical care.

## 2016-05-06 ENCOUNTER — Ambulatory Visit (INDEPENDENT_AMBULATORY_CARE_PROVIDER_SITE_OTHER): Payer: BLUE CROSS/BLUE SHIELD | Admitting: Orthopaedic Surgery

## 2016-05-19 NOTE — Progress Notes (Signed)
Office Visit Note  Patient: Shannon Jennings             Date of Birth: December 06, 1960           MRN: [id number]             PCP: Delanna Notice, MD Referring: Delanna Notice, MD Visit Date: 05/31/2016 Occupation: @GUAROCC @    Subjective:  Medication Management   History of Present Illness: Shannon Jennings is a 56 y.o. female   Last seen in our office in 01/22/2016.  Since that last visit, patient had right knee replacement through Dr. Rudene Anda office on 03/29/2016. Patient states that she is doing wonderful with her knee surgery. She is doing physical therapy and plans to follow their advice very carefully and closely.  Patient's fibromyalgia discomfort is rated 4 on a scale of 0-10.  Activities of Daily Living:  Patient reports morning stiffness for 10-15 minutes.   Patient Reports nocturnal pain.  Difficulty dressing/grooming: Denies Difficulty climbing stairs: Reports Difficulty getting out of chair: Reports Difficulty using hands for taps, buttons, cutlery, and/or writing: Reports   Review of Systems  Constitutional: Positive for fatigue.  HENT: Negative for mouth sores and mouth dryness.   Eyes: Negative for dryness.  Respiratory: Negative for shortness of breath.   Gastrointestinal: Negative for constipation and diarrhea.  Musculoskeletal: Positive for myalgias and myalgias.  Skin: Negative for sensitivity to sunlight.  Psychiatric/Behavioral: Positive for sleep disturbance. Negative for decreased concentration.    PMFS History:  Patient Active Problem List   Diagnosis Date Noted  . Primary osteoarthritis of left knee 05/30/2016  . S/P total knee replacement using cement, right 04/12/2016  . Fibromyalgia 01/22/2016  . Primary osteoarthritis of both hands 01/22/2016  . Essential hypertension 01/22/2016  . History of coronary artery disease 01/22/2016  . History of Graves' disease 01/22/2016  . Dyslipidemia 01/22/2016  . History of thalassemia minor 01/22/2016      Past Medical History:  Diagnosis Date  . Abnormally small mouth   . Complication of anesthesia   . Coronary artery disease   . 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 Difficult intubation 07/2012  . Fibromyalgia   . High cholesterol   . History of Graves' disease   . History of migraine   . Hypertension    under control with med., has been on med. x 4 yr.  . Hypothyroidism   . Osteoarthritis   . S/P coronary artery stent placement 04/2011   states right coronary artery  . Thalassemia minor     Family History  Problem Relation Age of Onset  . Heart disease Mother   . Arthritis Mother   . Cancer Sister   . Seizures Sister   . Arthritis Brother   . Diabetes Brother    Past Surgical History:  Procedure Laterality Date  . ABDOMINAL HERNIA REPAIR  04/2012  . ABDOMINAL SURGERY  01/2011   for intestinal blockage  . APPENDECTOMY  1984  . CARDIAC CATHETERIZATION    . CHOLECYSTECTOMY  07/2012  . COLONOSCOPY    . CORONARY ANGIOPLASTY  04/2011   stent x 1  . DISTAL INTERPHALANGEAL JOINT FUSION Bilateral 12/25/2012   Procedure: DISTAL INTERPHALANGEAL JOINT FUSION RIGHT AND LEFT SMALL FINGER;  Surgeon: Cammie Sickle., MD;  Location: [facility];  Service: Orthopedics;  Laterality: Bilateral;  left and right small finger  . ESOPHAGOGASTRODUODENOSCOPY    . HAND SURGERY  2014   Bilateral pinky finger distal joint fusion  . HAND SURGERY  2016   Right Thumb reconstruction and left middle finger distal joint fusion  . HAND SURGERY     Scar tissue removal right palm  . OVARIAN CYST REMOVAL    . TOTAL KNEE ARTHROPLASTY Right 03/29/2016  . TOTAL KNEE ARTHROPLASTY Right 03/29/2016   Procedure: TOTAL KNEE ARTHROPLASTY;  Surgeon: Garald Balding, MD;  Location: [facility];  Service: Orthopedics;  Laterality: Right;  . TUBAL LIGATION Bilateral 1993  . WISDOM TOOTH EXTRACTION     Social History   Social History Narrative  . No narrative on  file     Objective: Vital Signs: BP 124/78   Pulse 74   Resp 14   Ht 5\' 3"  (1.6 m)   Wt 146 lb (66.2 kg)   BMI 25.86 kg/m    Physical Exam  Constitutional: She is oriented to person, place, and time. She appears well-developed and well-nourished.  HENT:  Head: Normocephalic and atraumatic.  Eyes: EOM are normal. Pupils are equal, round, and reactive to light.  Cardiovascular: Normal rate, regular rhythm and normal heart sounds.  Exam reveals no gallop and no friction rub.   No murmur heard. Pulmonary/Chest: Effort normal and breath sounds normal. She has no wheezes. She has no rales.  Abdominal: Soft. Bowel sounds are normal. She exhibits no distension. There is no tenderness. There is no guarding. No hernia.  Musculoskeletal: Normal range of motion. She exhibits no edema, tenderness or deformity.  Lymphadenopathy:    She has no cervical adenopathy.  Neurological: She is alert and oriented to person, place, and time. Coordination normal.  Skin: Skin is warm and dry. Capillary refill takes less than 2 seconds. No rash noted.  Psychiatric: She has a normal mood and affect. Her behavior is normal.  Nursing note and vitals reviewed.    Musculoskeletal Exam:  Full range of motion of all joints Grip strength is equal and strong bilaterally For mild to tender points are 11/18  CDAI Exam: CDAI Homunculus Exam:   Joint Counts:  CDAI Tender Joint count: 0 CDAI Swollen Joint count: 0  Global Assessments:  Patient Global Assessment: 4 Provider Global Assessment: 4  CDAI Calculated Score: 8    Investigation: Findings:  Jan 22 2016 UDS  Narcotic Agreement 08/20/2015   DEXA followed by Gynecologist, patient states normal 2016.  CBC Latest Ref Rng & Units 03/31/2016 03/30/2016 03/22/2016  WBC 4.0 - 10.5 K/uL 10.2 8.5 8.0  Hemoglobin 12.0 - 15.0 g/dL 10.2(L) 9.7(L) 12.3  Hematocrit 36.0 - 46.0 % 33.2(L) 31.8(L) 39.3  Platelets 150 - 400 K/uL 191 162 224   CMP Latest Ref Rng  & Units 03/31/2016 03/30/2016 03/22/2016  Glucose 65 - 99 mg/dL 135(H) 159(H) 87  BUN 6 - 20 mg/dL 8 12 13   Creatinine 0.44 - 1.00 mg/dL 0.60 0.74 0.69  Sodium 135 - 145 mmol/L 135 139 140  Potassium 3.5 - 5.1 mmol/L 4.1 4.7 3.6  Chloride 101 - 111 mmol/L 102 104 106  CO2 22 - 32 mmol/L 26 28 24   Calcium 8.9 - 10.3 mg/dL 9.1 9.0 9.9  Total Protein 6.5 - 8.1 g/dL - - 7.1  Total Bilirubin 0.3 - 1.2 mg/dL - - 1.0  Alkaline Phos 38 - 126 U/L - - 113  AST 15 - 41 U/L - - 29  ALT 14 - 54 U/L - - 37    Imaging: No results found.  Speciality Comments: No specialty  comments available.    Procedures:  No procedures performed Allergies: Toradol [ketorolac tromethamine]; Dilaudid [hydromorphone hcl]; Penicillins; and Sulfa antibiotics   Assessment / Plan:     Visit Diagnoses: Fibromyalgia  S/P total knee replacement using cement, right  Primary osteoarthritis of left knee  Primary osteoarthritis of both hands  History of thalassemia minor  History of Graves' disease  History of coronary artery disease  History of hypertension  History of hyperlipidemia  Other chronic pain - on Tramadol, UDS 1/18   DXA? ==> She did her bone density 2 years ago by her OB/GYN and patient reports that everything was normal. She will get [place] a copy for our records.  Her other medical problems include history of right fourth finger enchondroma resected, bilateral fifth DIP fusion, right [facility] surgery 2016, left second DIP fusion, right third and fourth trigger finger release 2016, history of intestinal blockage, history of cholecystectomy  Plan: #1: Fibromyalgia syndrome. Active disease with generalized pain and 11 out of 18 tender points.  #2: High-risk prescription/narcotic medication use Tramadol 1-2 pills up to 3 times a day Patient's urine drug screen was done January 2018 was negative. Patient has updated narcotic agreement dated August 2017. We will updated today  #3: Status post right  total knee replacement Done through Dr. Rudene Anda office on March 2018 patient doing well.  #4: Normal bone density per patient done through OB/GYN's office recently.  Orders: No orders of the defined types were placed in this encounter.  Meds ordered this encounter  Medications  . methocarbamol (ROBAXIN) 500 MG tablet    Sig: Take 1 tablet (500 mg total) by mouth 2 (two) times daily as needed for muscle spasms.    Dispense:  180 tablet    Refill:  1  . traMADol (ULTRAM) 50 MG tablet    Sig: TAKE 2 TABLETS BY MOUTH 3 TIMES A DAY AS NEEDED    Dispense:  180 tablet    Refill:  1    Not to exceed 4 additional fills before 08/08/2016    Face-to-face time spent with patient was 30 minutes. 50% of time was spent in counseling and coordination of care.  Follow-Up Instructions: Return in about 5 months (around 10/31/2016) for [place], [place], lt tkr march 2018 .   Eliezer Lofts, PA-C  Note - This record has been created using [employer].  Chart creation errors have been sought, but may not always  have been located. Such creation errors do not reflect on  the standard of medical care.

## 2016-05-30 DIAGNOSIS — M1712 Unilateral primary osteoarthritis, left knee: Secondary | ICD-10-CM | POA: Insufficient documentation

## 2016-05-31 ENCOUNTER — Encounter: Payer: Self-pay | Admitting: Rheumatology

## 2016-05-31 ENCOUNTER — Ambulatory Visit (INDEPENDENT_AMBULATORY_CARE_PROVIDER_SITE_OTHER): Payer: BLUE CROSS/BLUE SHIELD | Admitting: Rheumatology

## 2016-05-31 VITALS — BP 124/78 | HR 74 | Resp 14 | Ht 63.0 in | Wt 146.0 lb

## 2016-05-31 DIAGNOSIS — G8929 Other chronic pain: Secondary | ICD-10-CM | POA: Diagnosis not present

## 2016-05-31 DIAGNOSIS — M19042 Primary osteoarthritis, left hand: Secondary | ICD-10-CM

## 2016-05-31 DIAGNOSIS — Z8679 Personal history of other diseases of the circulatory system: Secondary | ICD-10-CM | POA: Diagnosis not present

## 2016-05-31 DIAGNOSIS — M797 Fibromyalgia: Secondary | ICD-10-CM

## 2016-05-31 DIAGNOSIS — M1712 Unilateral primary osteoarthritis, left knee: Secondary | ICD-10-CM

## 2016-05-31 DIAGNOSIS — M19041 Primary osteoarthritis, right hand: Secondary | ICD-10-CM | POA: Diagnosis not present

## 2016-05-31 DIAGNOSIS — Z862 Personal history of diseases of the blood and blood-forming organs and certain disorders involving the immune mechanism: Secondary | ICD-10-CM | POA: Diagnosis not present

## 2016-05-31 DIAGNOSIS — Z8639 Personal history of other endocrine, nutritional and metabolic disease: Secondary | ICD-10-CM | POA: Diagnosis not present

## 2016-05-31 DIAGNOSIS — Z96651 Presence of right artificial knee joint: Secondary | ICD-10-CM | POA: Diagnosis not present

## 2016-05-31 MED ORDER — TRAMADOL HCL 50 MG PO TABS: ORAL_TABLET | ORAL | 1 refills | Status: DC

## 2016-05-31 MED ORDER — METHOCARBAMOL 500 MG PO TABS: 500.0000 mg | ORAL_TABLET | Freq: Two times a day (BID) | ORAL | 1 refills | Status: DC | PRN

## 2016-06-03 NOTE — Addendum Note (Signed)
Addendum  created 06/03/16 1256 by Phinneas Shakoor, MD   Sign clinical note    

## 2016-06-23 ENCOUNTER — Telehealth: Payer: Self-pay

## 2016-06-23 ENCOUNTER — Other Ambulatory Visit: Payer: Self-pay | Admitting: Rheumatology

## 2016-06-23 NOTE — Telephone Encounter (Signed)
Patient would like to know why her Rx for Tramadol was denied.  Stated that it may have been to early to fill Rx.  CB# is [phone].  Please Advise .  Thank You.

## 2016-06-23 NOTE — Telephone Encounter (Signed)
  FYI ===>  I called the pharmacy [id number] - [place], [place] [phone] (Phone)  I spoke with [name].She looked at the hard copy of our tramadol prescription.She realized that the refill that we asked for was accidentally missed by their pharmacist and [name] found that patient is older refill.She does note that patient cannot get a refill until June 29 or 07/02/2016.[name] will call the patient and notify them that she can come in and get a refill June 29 or 07/02/2016.

## 2016-07-21 ENCOUNTER — Ambulatory Visit: Payer: BLUE CROSS/BLUE SHIELD | Admitting: Rheumatology

## 2016-07-26 ENCOUNTER — Other Ambulatory Visit: Payer: Self-pay | Admitting: Rheumatology

## 2016-07-26 NOTE — Telephone Encounter (Signed)
ok 

## 2016-07-26 NOTE — Telephone Encounter (Signed)
Last Visit: 05/31/16 Next Visit: 11/01/16 UDS:  02/01/16 Narc Agreement: 05/31/16  Okay to refill tramadol?

## 2016-07-27 ENCOUNTER — Telehealth: Payer: Self-pay | Admitting: Rheumatology

## 2016-07-27 NOTE — Telephone Encounter (Signed)
Patient advised prescription has been faxed to the pharmacy.

## 2016-07-27 NOTE — Telephone Encounter (Signed)
Patient needs an rx for Tr[facility]ad[facility] [address] [place]. Please put allotted refills on script per patient. Please call patient with questions, or concerns.

## 2016-08-08 ENCOUNTER — Ambulatory Visit (INDEPENDENT_AMBULATORY_CARE_PROVIDER_SITE_OTHER): Payer: BLUE CROSS/BLUE SHIELD | Admitting: Orthopaedic Surgery

## 2016-08-17 ENCOUNTER — Telehealth (INDEPENDENT_AMBULATORY_CARE_PROVIDER_SITE_OTHER): Payer: Self-pay | Admitting: Orthopaedic Surgery

## 2016-08-17 NOTE — Telephone Encounter (Signed)
Patient has an appt on 8/26, but wants to know if she can get an appt for Celebrex before then. Patients knee is swelling. Patient uses [facility]. Please call to advise.

## 2016-08-17 NOTE — Telephone Encounter (Signed)
Please advise on attached message

## 2016-08-18 ENCOUNTER — Other Ambulatory Visit (INDEPENDENT_AMBULATORY_CARE_PROVIDER_SITE_OTHER): Payer: Self-pay

## 2016-08-18 MED ORDER — CELECOXIB 200 MG PO CAPS: 200.0000 mg | ORAL_CAPSULE | ORAL | 0 refills | Status: DC | PRN

## 2016-08-18 NOTE — Telephone Encounter (Signed)
[facility] CELEBREX 200MG  DAILY #[id number] NO REFILLS

## 2016-08-23 ENCOUNTER — Other Ambulatory Visit: Payer: Self-pay | Admitting: Rheumatology

## 2016-08-23 MED ORDER — TRAMADOL HCL 50 MG PO TABS: ORAL_TABLET | ORAL | 0 refills | Status: DC

## 2016-08-23 NOTE — Telephone Encounter (Signed)
Last Visit: 05/31/16 Next Visit: 11/01/16  Okay to refill per Dr. Deveshwar 

## 2016-08-23 NOTE — Telephone Encounter (Signed)
Last Visit: 05/31/16 Next Visit: 11/01/16 UDS:  02/01/16 Narc Agreement: 05/31/16  Okay to refill tramadol?

## 2016-08-23 NOTE — Telephone Encounter (Signed)
ok 

## 2016-08-23 NOTE — Telephone Encounter (Signed)
Patient request a refill on Tramadol. [facility] [address].

## 2016-08-24 ENCOUNTER — Other Ambulatory Visit: Payer: Self-pay | Admitting: Rheumatology

## 2016-08-29 ENCOUNTER — Ambulatory Visit (INDEPENDENT_AMBULATORY_CARE_PROVIDER_SITE_OTHER): Payer: BLUE CROSS/BLUE SHIELD | Admitting: Orthopaedic Surgery

## 2016-08-29 ENCOUNTER — Encounter (INDEPENDENT_AMBULATORY_CARE_PROVIDER_SITE_OTHER): Payer: Self-pay | Admitting: Orthopaedic Surgery

## 2016-08-29 VITALS — BP 135/75 | HR 68 | Resp 16 | Ht 63.0 in | Wt 153.0 lb

## 2016-08-29 DIAGNOSIS — M1712 Unilateral primary osteoarthritis, left knee: Secondary | ICD-10-CM | POA: Diagnosis not present

## 2016-08-29 MED ORDER — BUPIVACAINE HCL 0.5 % IJ SOLN
3.0000 mL | INTRAMUSCULAR | Status: AC | PRN
  Administered 2016-08-29: 3 mL via INTRA_ARTICULAR

## 2016-08-29 MED ORDER — LIDOCAINE HCL 1 % IJ SOLN
5.0000 mL | INTRAMUSCULAR | Status: AC | PRN
  Administered 2016-08-29: 5 mL

## 2016-08-29 MED ORDER — METHYLPREDNISOLONE ACETATE 40 MG/ML IJ SUSP
80.0000 mg | INTRAMUSCULAR | Status: AC | PRN
  Administered 2016-08-29: 80 mg

## 2016-08-29 NOTE — Progress Notes (Signed)
Office Visit Note   Patient: Shannon Jennings           Date of Birth: August 20, 1960           MRN: [id number] Visit Date: 08/29/2016              Requested by: Delanna Notice, [place] [address] PCP: Delanna Notice, MD   Assessment & Plan: Visit Diagnoses:  1. Unilateral primary osteoarthritis, left knee   5 months status post successful right total knee replacement. Presently symptomatic in the left knee  Plan: Cortisone injection left knee. Consider left total knee replacement around the first of the year. Patient wishes  Follow-Up Instructions: Return if symptoms worsen or fail to improve.   Orders:  No orders of the defined types were placed in this encounter.  No orders of the defined types were placed in this encounter.     Procedures: Large Joint Inj Date/Time: 08/29/2016 9:54 AM Performed by: Garald Balding Authorized by: Garald Balding   Consent Given by:  Patient Timeout: prior to procedure the correct patient, procedure, and site was verified   Indications:  Pain and joint swelling Location:  Knee Site:  L knee Prep: patient was prepped and draped in usual sterile fashion   Needle Size:  25 G Needle Length:  1.5 inches Approach:  Anteromedial Ultrasound Guidance: No   Fluoroscopic Guidance: No   Arthrogram: No   Medications:  5 mL lidocaine 1 %; 80 mg methylPREDNISolone acetate 40 MG/ML; 3 mL bupivacaine 0.5 % Aspiration Attempted: No   Patient tolerance:  Patient tolerated the procedure well with no immediate complications     Clinical Data: No additional findings.   Subjective: No chief complaint on file. Doing very well 5 months status post right total knee replacement. Having more symptoms in  the left knee  which was previously noted to have arthritis. She's thinking of having her left knee replaced sometime around the first of the year but wishes to have a cortisone injection to help her get through the next "couple of  months". She's had prior cortisone and Hyalgan per Dr. Estanislado Pandy  HPI  Review of Systems  Constitutional: Negative for chills, fatigue and fever.  Eyes: Negative for itching.  Respiratory: Negative for chest tightness and shortness of breath.   Cardiovascular: Negative for chest pain, palpitations and leg swelling.  Gastrointestinal: Negative for blood in stool, constipation and diarrhea.  Musculoskeletal: Negative for back pain, joint swelling, neck pain and neck stiffness.  Neurological: Negative for dizziness, weakness, numbness and headaches.  Hematological: Bruises/bleeds easily.  Psychiatric/Behavioral: Negative for sleep disturbance. The patient is not nervous/anxious.      Objective: Vital Signs: BP 135/75   Pulse 68   Resp 16   Ht 5\' 3"  (1.6 m)   Wt 153 lb (69.4 kg)   BMI 27.10 kg/m   Physical Exam  Ortho Exam right knee without effusion. Full extension and quick flexion to at least 110. No instability. No effusion. No calf pain or swelling. Neurovascular exam intact. Incisions healing nicely. Left knee with positive patellar crepitation. Mild medial joint pain. No effusion. No instability. Pain along the medial compartment with weightbearing. No calf pain. No distal edema. Neurovascular exam intact. Straight leg raise negative bilaterally. Painless range of motion of both hips. Skin is intact. Patient awake alert and oriented 3. Mild limp when she first gets up but quickly resolves  Specialty Comments:  No specialty comments available.  Imaging: No results found.   PMFS History: Patient Active Problem List   Diagnosis Date Noted  . Primary osteoarthritis of left knee 05/30/2016  . S/P total knee replacement using cement, right 04/12/2016  . Fibromyalgia 01/22/2016  . Primary osteoarthritis of both hands 01/22/2016  . Essential hypertension 01/22/2016  . History of coronary artery disease 01/22/2016  . History of Graves' disease 01/22/2016  . Dyslipidemia  01/22/2016  . History of thalassemia minor 01/22/2016   Past Medical History:  Diagnosis Date  . Abnormally small mouth   . Complication of anesthesia   . Coronary artery disease   . 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 Difficult intubation 07/2012  . Fibromyalgia   . High cholesterol   . History of Graves' disease   . History of migraine   . Hypertension    under control with med., has been on med. x 4 yr.  . Hypothyroidism   . Osteoarthritis   . S/P coronary artery stent placement 04/2011   states right coronary artery  . Thalassemia minor     Family History  Problem Relation Age of Onset  . Heart disease Mother   . Arthritis Mother   . Cancer Sister   . Seizures Sister   . Arthritis Brother   . Diabetes Brother     Past Surgical History:  Procedure Laterality Date  . ABDOMINAL HERNIA REPAIR  04/2012  . ABDOMINAL SURGERY  01/2011   for intestinal blockage  . APPENDECTOMY  1984  . CARDIAC CATHETERIZATION    . CHOLECYSTECTOMY  07/2012  . COLONOSCOPY    . CORONARY ANGIOPLASTY  04/2011   stent x 1  . DISTAL INTERPHALANGEAL JOINT FUSION Bilateral 12/25/2012   Procedure: DISTAL INTERPHALANGEAL JOINT FUSION RIGHT AND LEFT SMALL FINGER;  Surgeon: Cammie Sickle., MD;  Location: [facility];  Service: Orthopedics;  Laterality: Bilateral;  left and right small finger  . ESOPHAGOGASTRODUODENOSCOPY    . HAND SURGERY  2014   Bilateral pinky finger distal joint fusion  . HAND SURGERY  2016   Right Thumb reconstruction and left middle finger distal joint fusion  . HAND SURGERY     Scar tissue removal right palm  . OVARIAN CYST REMOVAL    . TOTAL KNEE ARTHROPLASTY Right 03/29/2016  . TOTAL KNEE ARTHROPLASTY Right 03/29/2016   Procedure: TOTAL KNEE ARTHROPLASTY;  Surgeon: Garald Balding, MD;  Location: [facility];  Service: Orthopedics;  Laterality: Right;  . TUBAL LIGATION Bilateral 1993  . WISDOM TOOTH EXTRACTION      Social History   Occupational History  . Not on file.   Social History Main Topics  . Smoking status: Never Smoker  . Smokeless tobacco: Never Used  . Alcohol use No  . Drug use: No  . Sexual activity: Not on file

## 2016-09-20 ENCOUNTER — Other Ambulatory Visit: Payer: Self-pay | Admitting: Rheumatology

## 2016-09-20 MED ORDER — TRAMADOL HCL 50 MG PO TABS: ORAL_TABLET | ORAL | 0 refills | Status: DC

## 2016-09-20 NOTE — Telephone Encounter (Signed)
ok 

## 2016-09-20 NOTE — Telephone Encounter (Signed)
Patient advised to contact the pharmacy for Flexeril as she should have refills.  Last Visit: 05/31/16 Next Visit: 11/01/16 UDS: 02/01/16 Narc Agreement: 05/31/16  Okay to refill tramadol?

## 2016-09-20 NOTE — Telephone Encounter (Signed)
Patient requesting a refill on Tramadol, and Flexeril. Patient uses [facility] on [address] [facility] in [place].

## 2016-09-21 ENCOUNTER — Other Ambulatory Visit: Payer: Self-pay | Admitting: Rheumatology

## 2016-09-22 NOTE — Telephone Encounter (Signed)
Last Visit: 05/31/16 Next Visit: 11/01/16  Okay to refill per Dr. Estanislado Pandy

## 2016-09-23 ENCOUNTER — Other Ambulatory Visit (INDEPENDENT_AMBULATORY_CARE_PROVIDER_SITE_OTHER): Payer: Self-pay | Admitting: Orthopedic Surgery

## 2016-10-02 NOTE — Progress Notes (Signed)
Office Visit Note  Patient: Shannon Jennings             Date of Birth: Feb 26, 1960           MRN: [id number]             PCP: Delanna Notice, MD Referring: Delanna Notice, MD Visit Date: 10/05/2016 Occupation: @GUAROCC @    Subjective:  Pain hands, leg pain.   History of Present Illness: Shannon Jennings is a 56 y.o. female with history of fibromyalgia and osteoarthritis. She states she continues to have some discomfort from fibromyalgia. She's been having increased pain in her hands from underlying osteoarthritis. She states she has developed a mucin cyst on her left second finger DIP and she will be seeing Dr. Burney Gauze . She continues to have some lower extremity muscle pain. Her right total knee replacement is doing well. She has some discomfort in her left knee. She's been taking tramadol 2 tablets 3 times a day to control her like symptoms. She states her pain on scale of 0-10 without tramadol as about 9 and 10 and with tramadol about 2-3.  Activities of Daily Living:  Patient reports morning stiffness for 1 minute.   Patient Reports nocturnal pain.  Difficulty dressing/grooming: Denies Difficulty climbing stairs: Reports Difficulty getting out of chair: Denies Difficulty using hands for taps, buttons, cutlery, and/or writing: Denies   Review of Systems  Constitutional: Negative for fatigue, night sweats, weight gain, weight loss and weakness.  HENT: Negative for mouth sores, trouble swallowing, trouble swallowing, mouth dryness and nose dryness.   Eyes: Negative for pain, redness, visual disturbance and dryness.  Respiratory: Negative for cough, shortness of breath and difficulty breathing.   Cardiovascular: Negative for chest pain, palpitations, hypertension, irregular heartbeat and swelling in legs/feet.  Gastrointestinal: Negative for blood in stool, constipation and diarrhea.  Endocrine: Negative for increased urination.  Genitourinary: Negative for vaginal dryness.    Musculoskeletal: Positive for arthralgias, joint pain, myalgias, morning stiffness and myalgias. Negative for joint swelling, muscle weakness and muscle tenderness.  Skin: Negative for color change, rash, hair loss, skin tightness, ulcers and sensitivity to sunlight.  Allergic/Immunologic: Negative for susceptible to infections.  Neurological: Negative for dizziness, memory loss and night sweats.  Hematological: Negative for swollen glands.  Psychiatric/Behavioral: Positive for sleep disturbance. Negative for depressed mood. The patient is not nervous/anxious.     PMFS History:  Patient Active Problem List   Diagnosis Date Noted  . Primary osteoarthritis of left knee 05/30/2016  . S/P total knee replacement using cement, right 04/12/2016  . Fibromyalgia 01/22/2016  . Primary osteoarthritis of both hands 01/22/2016  . Essential hypertension 01/22/2016  . History of coronary artery disease 01/22/2016  . History of Graves' disease 01/22/2016  . Dyslipidemia 01/22/2016  . History of thalassemia minor 01/22/2016    Past Medical History:  Diagnosis Date  . Abnormally small mouth   . Complication of anesthesia   . Coronary artery disease   . 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 Difficult intubation 07/2012  . Fibromyalgia   . High cholesterol   . History of Graves' disease   . History of migraine   . Hypertension    under control with med., has been on med. x 4 yr.  . Hypothyroidism   . Osteoarthritis   . S/P coronary artery stent placement 04/2011   states right coronary artery  . Thalassemia minor     Family  History  Problem Relation Age of Onset  . Heart disease Mother   . Arthritis Mother   . Cancer Sister   . Seizures Sister   . Arthritis Brother   . Diabetes Brother    Past Surgical History:  Procedure Laterality Date  . ABDOMINAL HERNIA REPAIR  04/2012  . ABDOMINAL SURGERY  01/2011   for intestinal blockage  . APPENDECTOMY   1984  . CARDIAC CATHETERIZATION    . CHOLECYSTECTOMY  07/2012  . COLONOSCOPY    . CORONARY ANGIOPLASTY  04/2011   stent x 1  . DISTAL INTERPHALANGEAL JOINT FUSION Bilateral 12/25/2012   Procedure: DISTAL INTERPHALANGEAL JOINT FUSION RIGHT AND LEFT SMALL FINGER;  Surgeon: Cammie Sickle., MD;  Location: [facility];  Service: Orthopedics;  Laterality: Bilateral;  left and right small finger  . ESOPHAGOGASTRODUODENOSCOPY    . HAND SURGERY  2014   Bilateral pinky finger distal joint fusion  . HAND SURGERY  2016   Right Thumb reconstruction and left middle finger distal joint fusion  . HAND SURGERY     Scar tissue removal right palm  . HERNIA REPAIR    . KNEE ARTHROPLASTY    . OVARIAN CYST REMOVAL    . TOTAL KNEE ARTHROPLASTY Right 03/29/2016  . TOTAL KNEE ARTHROPLASTY Right 03/29/2016   Procedure: TOTAL KNEE ARTHROPLASTY;  Surgeon: Garald Balding, MD;  Location: [facility];  Service: Orthopedics;  Laterality: Right;  . TUBAL LIGATION Bilateral 1993  . WISDOM TOOTH EXTRACTION     Social History   Social History Narrative  . No narrative on file     Objective: Vital Signs: BP 136/74 (BP Location: Left Arm, Patient Position: Sitting, Cuff Size: Normal)   Pulse 67   Resp 14   Ht 5\' 3"  (1.6 m)   Wt 149 lb (67.6 kg)   BMI 26.39 kg/m    Physical Exam  Constitutional: She is oriented to person, place, and time. She appears well-developed and well-nourished.  HENT:  Head: Normocephalic and atraumatic.  Eyes: Conjunctivae and EOM are normal.  Neck: Normal range of motion.  Cardiovascular: Normal rate, regular rhythm, normal heart sounds and intact distal pulses.   Pulmonary/Chest: Effort normal and breath sounds normal.  Abdominal: Soft. Bowel sounds are normal.  Lymphadenopathy:    She has no cervical adenopathy.  Neurological: She is alert and oriented to person, place, and time.  Skin: Skin is warm and dry. Capillary refill takes less than 2 seconds.    Psychiatric: She has a normal mood and affect. Her behavior is normal.  Nursing note and vitals reviewed.    Musculoskeletal Exam: C-spine and thoracic lumbar spine limited range of motion. Shoulder joints elbow joints wrist joints are good range of motion. She has some DIP thickening in her hands consistent with osteoarthritis. Her right total knee replacement is doing well. She is some crepitus in her left knee joint.  CDAI Exam: No CDAI exam completed.    Investigation: No additional findings.UDS 01/2016 CBC Latest Ref Rng & Units 03/31/2016 03/30/2016 03/22/2016  WBC 4.0 - 10.5 K/uL 10.2 8.5 8.0  Hemoglobin 12.0 - 15.0 g/dL 10.2(L) 9.7(L) 12.3  Hematocrit 36.0 - 46.0 % 33.2(L) 31.8(L) 39.3  Platelets 150 - 400 K/uL 191 162 224   CMP Latest Ref Rng & Units 03/31/2016 03/30/2016 03/22/2016  Glucose 65 - 99 mg/dL 135(H) 159(H) 87  BUN 6 - 20 mg/dL 8 12 13   Creatinine 0.44 - 1.00 mg/dL 0.60 0.74 0.69  Sodium 135 - 145 mmol/L 135 139 140  Potassium 3.5 - 5.1 mmol/L 4.1 4.7 3.6  Chloride 101 - 111 mmol/L 102 104 106  CO2 22 - 32 mmol/L 26 28 24   Calcium 8.9 - 10.3 mg/dL 9.1 9.0 9.9  Total Protein 6.5 - 8.1 g/dL - - 7.1  Total Bilirubin 0.3 - 1.2 mg/dL - - 1.0  Alkaline Phos 38 - 126 U/L - - 113  AST 15 - 41 U/L - - 29  ALT 14 - 54 U/L - - 37    Imaging: No results found.  Speciality Comments: No specialty comments available.    Procedures:  No procedures performed Allergies: Toradol [ketorolac tromethamine]; Dilaudid [hydromorphone hcl]; Penicillins; and Sulfa antibiotics   Assessment / Plan:     Visit Diagnoses: Fibromyalgia: Generalized pain discomfort and positive tender points. We had detailed discussion regarding her medications. She states she cannot function on the lower dose of tramadol. I also discussed switching her from Flexeril to tizanidine which she is not ready.  Primary osteoarthritis of both hands she has some DIP thickening. She'll be seeing Dr.  Burney Gauze.  Status post total right knee replacement: doing well.  Primary osteoarthritis of left knee: She has some discomfort. She is on Celebrex per Dr. Durward Fortes for knee joint discomfort.  Medication monitoring encounter - She is on tramadol for pain management. UDS 01/2016 - Plan: CBC with Differential/Platelet, COMPLETE METABOLIC PANEL WITH GFR, Pain Mgmt, Profile 5 w/Conf, U, Pain Mgmt, Tramadol w/medMATCH, U  History of hypertension: Her systolic pressure is slightly elevated. I've advised her to monitor that.  History of coronary artery disease - status post stent placement  Dyslipidemia: Dietary modification was discussed.  Other medical problems are listed as follows:  History of Graves' disease  History of thalassemia minor  History of cholecystectomy    Orders: Orders Placed This Encounter  Procedures  . CBC with Differential/Platelet  . COMPLETE METABOLIC PANEL WITH GFR  . Pain Mgmt, Profile 5 w/Conf, U  . Pain Mgmt, Tramadol w/medMATCH, U   No orders of the defined types were placed in this encounter.   Face-to-face time spent with patient was 30 minutes. 50% of time was spent in counseling and coordination of care.  Follow-Up Instructions: Return in about 6 months (around 04/05/2017) for FMS OA .   Bo Merino, MD  Note - This record has been created using Editor, commissioning.  Chart creation errors have been sought, but may not always  have been located. Such creation errors do not reflect on  the standard of medical care.

## 2016-10-05 ENCOUNTER — Encounter: Payer: Self-pay | Admitting: Rheumatology

## 2016-10-05 ENCOUNTER — Other Ambulatory Visit: Payer: Self-pay | Admitting: *Deleted

## 2016-10-05 ENCOUNTER — Ambulatory Visit (INDEPENDENT_AMBULATORY_CARE_PROVIDER_SITE_OTHER): Payer: BLUE CROSS/BLUE SHIELD | Admitting: Rheumatology

## 2016-10-05 VITALS — BP 136/74 | HR 67 | Resp 14 | Ht 63.0 in | Wt 149.0 lb

## 2016-10-05 DIAGNOSIS — Z8639 Personal history of other endocrine, nutritional and metabolic disease: Secondary | ICD-10-CM | POA: Diagnosis not present

## 2016-10-05 DIAGNOSIS — Z5181 Encounter for therapeutic drug level monitoring: Secondary | ICD-10-CM | POA: Diagnosis not present

## 2016-10-05 DIAGNOSIS — M19042 Primary osteoarthritis, left hand: Secondary | ICD-10-CM | POA: Diagnosis not present

## 2016-10-05 DIAGNOSIS — Z9049 Acquired absence of other specified parts of digestive tract: Secondary | ICD-10-CM | POA: Diagnosis not present

## 2016-10-05 DIAGNOSIS — M797 Fibromyalgia: Secondary | ICD-10-CM | POA: Diagnosis not present

## 2016-10-05 DIAGNOSIS — Z862 Personal history of diseases of the blood and blood-forming organs and certain disorders involving the immune mechanism: Secondary | ICD-10-CM

## 2016-10-05 DIAGNOSIS — M19041 Primary osteoarthritis, right hand: Secondary | ICD-10-CM | POA: Diagnosis not present

## 2016-10-05 DIAGNOSIS — M1712 Unilateral primary osteoarthritis, left knee: Secondary | ICD-10-CM

## 2016-10-05 DIAGNOSIS — E785 Hyperlipidemia, unspecified: Secondary | ICD-10-CM | POA: Diagnosis not present

## 2016-10-05 DIAGNOSIS — Z8679 Personal history of other diseases of the circulatory system: Secondary | ICD-10-CM | POA: Diagnosis not present

## 2016-10-05 DIAGNOSIS — Z96651 Presence of right artificial knee joint: Secondary | ICD-10-CM

## 2016-10-05 MED ORDER — TRAMADOL HCL 50 MG PO TABS: ORAL_TABLET | ORAL | 0 refills | Status: DC

## 2016-10-05 MED ORDER — ZOLPIDEM TARTRATE 5 MG PO TABS: 5.0000 mg | ORAL_TABLET | Freq: Every evening | ORAL | 5 refills | Status: DC | PRN

## 2016-10-05 NOTE — Addendum Note (Signed)
Addended by: Shona Needles on: 10/05/2016 11:39 AM   Modules accepted: Orders

## 2016-10-06 NOTE — Progress Notes (Signed)
Labs are stable.

## 2016-10-08 LAB — COMPLETE METABOLIC PANEL WITH GFR
AG RATIO: 2 (calc) (ref 1.0–2.5)
ALKALINE PHOSPHATASE (APISO): 103 U/L (ref 33–130)
ALT: 29 U/L (ref 6–29)
AST: 21 U/L (ref 10–35)
Albumin: 4.7 g/dL (ref 3.6–5.1)
BUN: 12 mg/dL (ref 7–25)
CALCIUM: 10 mg/dL (ref 8.6–10.4)
CHLORIDE: 107 mmol/L (ref 98–110)
CO2: 28 mmol/L (ref 20–32)
Creat: 0.65 mg/dL (ref 0.50–1.05)
GFR, EST NON AFRICAN AMERICAN: 99 mL/min/{1.73_m2} (ref >=60)
GFR, Est African American: 114 mL/min/{1.73_m2} (ref >=60)
Globulin: 2.3 g/dL (calc) (ref 1.9–3.7)
Glucose, Bld: 93 mg/dL (ref 65–99)
POTASSIUM: 4.6 mmol/L (ref 3.5–5.3)
Sodium: 141 mmol/L (ref 135–146)
Total Bilirubin: 0.8 mg/dL (ref 0.2–1.2)
Total Protein: 7 g/dL (ref 6.1–8.1)

## 2016-10-08 LAB — CBC WITH DIFFERENTIAL/PLATELET
BASOS ABS: 69 {cells}/uL (ref 0–200)
BASOS PCT: 0.9 %
Eosinophils Absolute: 246 cells/uL (ref 15–500)
Eosinophils Relative: 3.2 %
HCT: 37.3 % (ref 35.0–45.0)
Hemoglobin: 11.3 g/dL — ABNORMAL LOW (ref 11.7–15.5)
LYMPHS ABS: 2318 {cells}/uL (ref 850–3900)
MCH: 19.7 pg — ABNORMAL LOW (ref 27.0–33.0)
MCHC: 30.3 g/dL — AB (ref 32.0–36.0)
MCV: 65 fL — AB (ref 80.0–100.0)
MONOS PCT: 8.8 %
MPV: 10.5 fL (ref 7.5–12.5)
NEUTROS PCT: 57 %
Neutro Abs: 4389 cells/uL (ref 1500–7800)
PLATELETS: 262 10*3/uL (ref 140–400)
RBC: 5.74 10*6/uL — AB (ref 3.80–5.10)
RDW: 15.2 % — AB (ref 11.0–15.0)
Total Lymphocyte: 30.1 %
WBC mixed population: 678 cells/uL (ref 200–950)
WBC: 7.7 10*3/uL (ref 3.8–10.8)

## 2016-10-08 LAB — PAIN MGMT, PROFILE 5 W/CONF, U
Amphetamines: NEGATIVE ng/mL (ref <500)
Barbiturates: NEGATIVE ng/mL (ref <300)
Benzodiazepines: NEGATIVE ng/mL (ref <100)
CREATININE: 82.1 mg/dL
Cocaine Metabolite: NEGATIVE ng/mL (ref <150)
MARIJUANA METABOLITE: NEGATIVE ng/mL (ref <20)
Methadone Metabolite: NEGATIVE ng/mL (ref <100)
OXIDANT: NEGATIVE ug/mL (ref <200)
OXYCODONE: NEGATIVE ng/mL (ref <100)
Opiates: NEGATIVE ng/mL (ref <100)
PH: 5.91 (ref 4.5–9.0)

## 2016-10-08 LAB — PAIN MGMT, TRAMADOL W/MEDMATCH, U
DESMETHYLTRAMADOL: 9872 ng/mL — AB (ref <100)
Tramadol: >50000 ng/mL — ABNORMAL HIGH (ref <100)

## 2016-10-19 ENCOUNTER — Telehealth: Payer: Self-pay

## 2016-10-19 NOTE — Telephone Encounter (Signed)
Patient would like a Rx refill on Tramadol for a 90 day supply or to have refills on Rx.  CB# [phone].  Please advise.  Thank you.

## 2016-10-19 NOTE — Telephone Encounter (Signed)
Attempted to contact the patient and left message for patient to call the office.  

## 2016-10-19 NOTE — Telephone Encounter (Signed)
Dr. Durward Fortes does not want to give her Celebrex and she should ask Dr. Estanislado Pandy for the rx.

## 2016-10-19 NOTE — Telephone Encounter (Signed)
Patient would like a Rx refill on Celebrex for a 90 day supply.  Cb# is [phone].  Please advise.  Thank you.

## 2016-10-20 NOTE — Telephone Encounter (Signed)
Called 1x

## 2016-10-21 ENCOUNTER — Telehealth: Payer: Self-pay

## 2016-10-21 NOTE — Telephone Encounter (Signed)
Patient called concerning Rx refill for Tramadol.  Called patient back, but no answer, left a VM for patient to return call.

## 2016-10-26 ENCOUNTER — Telehealth: Payer: Self-pay

## 2016-10-26 NOTE — Telephone Encounter (Signed)
Patient would like a Rx refill on Celebrex.  CB# is [phone].  Please advise.  Thank you.

## 2016-10-27 MED ORDER — CELECOXIB 200 MG PO CAPS: ORAL_CAPSULE | ORAL | 4 refills | Status: DC

## 2016-10-27 NOTE — Addendum Note (Signed)
Addended by: Carole Binning on: 10/27/2016 01:28 PM   Modules accepted: Orders

## 2016-10-27 NOTE — Telephone Encounter (Signed)
Last Visit: 10/05/16 Next Visit: 04/05/17 Labs: 10/05/16 Stable  Okay to refill per Dr. Estanislado Pandy

## 2016-10-27 NOTE — Telephone Encounter (Signed)
Prescription sent in for patient on 10/05/16

## 2016-11-01 ENCOUNTER — Ambulatory Visit: Payer: BLUE CROSS/BLUE SHIELD | Admitting: Rheumatology

## 2016-11-28 ENCOUNTER — Other Ambulatory Visit: Payer: Self-pay | Admitting: Rheumatology

## 2016-11-28 NOTE — Telephone Encounter (Signed)
Last Visit: 10/05/16 Next Visit: 04/05/17  Okay to refill per Dr. Estanislado Pandy

## 2017-01-11 ENCOUNTER — Ambulatory Visit: Payer: BLUE CROSS/BLUE SHIELD | Admitting: Rheumatology

## 2017-01-11 ENCOUNTER — Encounter (INDEPENDENT_AMBULATORY_CARE_PROVIDER_SITE_OTHER): Payer: Self-pay

## 2017-01-11 ENCOUNTER — Encounter: Payer: Self-pay | Admitting: Rheumatology

## 2017-01-11 VITALS — BP 133/86 | HR 71 | Resp 12 | Ht 63.0 in | Wt 157.0 lb

## 2017-01-11 DIAGNOSIS — M25562 Pain in left knee: Secondary | ICD-10-CM

## 2017-01-11 DIAGNOSIS — Z5181 Encounter for therapeutic drug level monitoring: Secondary | ICD-10-CM

## 2017-01-11 DIAGNOSIS — Z8639 Personal history of other endocrine, nutritional and metabolic disease: Secondary | ICD-10-CM | POA: Diagnosis not present

## 2017-01-11 DIAGNOSIS — Z96651 Presence of right artificial knee joint: Secondary | ICD-10-CM

## 2017-01-11 DIAGNOSIS — M19041 Primary osteoarthritis, right hand: Secondary | ICD-10-CM

## 2017-01-11 DIAGNOSIS — E785 Hyperlipidemia, unspecified: Secondary | ICD-10-CM | POA: Diagnosis not present

## 2017-01-11 DIAGNOSIS — M797 Fibromyalgia: Secondary | ICD-10-CM | POA: Diagnosis not present

## 2017-01-11 DIAGNOSIS — M1712 Unilateral primary osteoarthritis, left knee: Secondary | ICD-10-CM

## 2017-01-11 DIAGNOSIS — Z862 Personal history of diseases of the blood and blood-forming organs and certain disorders involving the immune mechanism: Secondary | ICD-10-CM

## 2017-01-11 DIAGNOSIS — Z8679 Personal history of other diseases of the circulatory system: Secondary | ICD-10-CM | POA: Diagnosis not present

## 2017-01-11 DIAGNOSIS — Z9049 Acquired absence of other specified parts of digestive tract: Secondary | ICD-10-CM | POA: Diagnosis not present

## 2017-01-11 DIAGNOSIS — M19042 Primary osteoarthritis, left hand: Secondary | ICD-10-CM | POA: Diagnosis not present

## 2017-01-11 DIAGNOSIS — G8929 Other chronic pain: Secondary | ICD-10-CM

## 2017-01-11 MED ORDER — TRAMADOL HCL 50 MG PO TABS: ORAL_TABLET | ORAL | 0 refills | Status: DC

## 2017-01-11 MED ORDER — LIDOCAINE HCL 1 % IJ SOLN
1.5000 mL | INTRAMUSCULAR | Status: AC | PRN
  Administered 2017-01-11: 1.5 mL

## 2017-01-11 MED ORDER — GABAPENTIN 100 MG PO CAPS: ORAL_CAPSULE | ORAL | 2 refills | Status: DC

## 2017-01-11 MED ORDER — TRIAMCINOLONE ACETONIDE 40 MG/ML IJ SUSP
40.0000 mg | INTRAMUSCULAR | Status: AC | PRN
  Administered 2017-01-11: 40 mg via INTRA_ARTICULAR

## 2017-01-11 NOTE — Progress Notes (Signed)
Office Visit Note  Patient: Shannon Jennings             Date of Birth: Mar 20, 1960           MRN: [id number]             PCP: Delanna Notice, MD Referring: Delanna Notice, MD Visit Date: 01/11/2017 Occupation: @GUAROCC @    Subjective:  Muscle tenderness in bilateral legs   History of Present Illness: MICHELINA Jennings is a 57 y.o. female with history of osteoarthritis and fibromyalgia.  Patient states her right knee replacement that was performed in March by Dr. Durward Fortes is doing great.  She hopes to get her left knee replaced in June 2019.  She states she discontinued her statin use, and she has noticed significant relief in myalgias., especially in her upper extremities.  She states that her restless legs are worsening.  She is having severe pain in her quadriceps and calves, especially at night when trying to go to sleep.  She continues to take Robaxin during the day and Flexeril at night.  She has been taking Tramadol 100 mg TID due to this pain.   She states her pain level is still a 6/10 on tramadol.  She also takes Celebrex for pain relief.  She would like to discuss starting on Gabapentin.  She uses Ambien very rarely for insomnia.  She denies any other joint pain or swelling.  She continues to take Vitamin D 2,000 units daily.     Activities of Daily Living:  Patient reports morning stiffness for 30 minutes.   Patient Reports nocturnal pain.  Difficulty dressing/grooming: Denies Difficulty climbing stairs: Reports Difficulty getting out of chair: Reports Difficulty using hands for taps, buttons, cutlery, and/or writing: Reports   Review of Systems  Constitutional: Positive for fatigue. Negative for weakness.  HENT: Negative for mouth sores, mouth dryness and nose dryness.   Eyes: Negative for redness and dryness.  Respiratory: Negative for cough, hemoptysis, shortness of breath and difficulty breathing.   Cardiovascular: Negative for chest pain, palpitations, hypertension, irregular  heartbeat and swelling in legs/feet.  Gastrointestinal: Negative for blood in stool, constipation and diarrhea.  Endocrine: Negative for increased urination.  Genitourinary: Negative for painful urination.  Musculoskeletal: Positive for arthralgias, joint pain, myalgias, morning stiffness, muscle tenderness and myalgias. Negative for joint swelling and muscle weakness.  Skin: Negative for color change, pallor, rash, hair loss, nodules/bumps, redness, skin tightness, ulcers and sensitivity to sunlight.  Neurological: Negative for dizziness, numbness and headaches.  Hematological: Negative for swollen glands.  Psychiatric/Behavioral: Positive for sleep disturbance. Negative for depressed mood. The patient is not nervous/anxious.     PMFS History:  Patient Active Problem List   Diagnosis Date Noted  . Primary osteoarthritis of left knee 05/30/2016  . S/P total knee replacement using cement, right 04/12/2016  . Fibromyalgia 01/22/2016  . Primary osteoarthritis of both hands 01/22/2016  . Essential hypertension 01/22/2016  . History of coronary artery disease 01/22/2016  . History of Graves' disease 01/22/2016  . Dyslipidemia 01/22/2016  . History of thalassemia minor 01/22/2016    Past Medical History:  Diagnosis Date  . Abnormally small mouth   . Complication of anesthesia   . Coronary artery disease   . 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 Difficult intubation 07/2012  . Fibromyalgia   . High cholesterol   . History of Graves' disease   . History of migraine   .  Hypertension    under control with med., has been on med. x 4 yr.  . Hypothyroidism   . Osteoarthritis   . S/P coronary artery stent placement 04/2011   states right coronary artery  . Thalassemia minor     Family History  Problem Relation Age of Onset  . Heart disease Mother   . Arthritis Mother   . Cancer Sister   . Seizures Sister   . Arthritis Brother   . Diabetes  Brother    Past Surgical History:  Procedure Laterality Date  . ABDOMINAL HERNIA REPAIR  04/2012  . ABDOMINAL SURGERY  01/2011   for intestinal blockage  . APPENDECTOMY  1984  . CARDIAC CATHETERIZATION    . CHOLECYSTECTOMY  07/2012  . COLONOSCOPY    . CORONARY ANGIOPLASTY  04/2011   stent x 1  . DISTAL INTERPHALANGEAL JOINT FUSION Bilateral 12/25/2012   Procedure: DISTAL INTERPHALANGEAL JOINT FUSION RIGHT AND LEFT SMALL FINGER;  Surgeon: Cammie Sickle., MD;  Location: [facility];  Service: Orthopedics;  Laterality: Bilateral;  left and right small finger  . ESOPHAGOGASTRODUODENOSCOPY    . HAND SURGERY  2014   Bilateral pinky finger distal joint fusion  . HAND SURGERY  2016   Right Thumb reconstruction and left middle finger distal joint fusion  . HAND SURGERY     Scar tissue removal right palm  . HERNIA REPAIR    . KNEE ARTHROPLASTY    . OVARIAN CYST REMOVAL    . TOTAL KNEE ARTHROPLASTY Right 03/29/2016  . TOTAL KNEE ARTHROPLASTY Right 03/29/2016   Procedure: TOTAL KNEE ARTHROPLASTY;  Surgeon: Garald Balding, MD;  Location: [facility];  Service: Orthopedics;  Laterality: Right;  . TUBAL LIGATION Bilateral 1993  . WISDOM TOOTH EXTRACTION     Social History   Social History Narrative  . Not on file     Objective: Vital Signs: BP 133/86 (BP Location: Left Arm, Patient Position: Sitting, Cuff Size: Small)   Pulse 71   Resp 12   Ht 5\' 3"  (1.6 m)   Wt 157 lb (71.2 kg)   BMI 27.81 kg/m    Physical Exam  Constitutional: She is oriented to person, place, and time. She appears well-developed and well-nourished.  HENT:  Head: Normocephalic and atraumatic.  Eyes: Conjunctivae and EOM are normal.  Neck: Normal range of motion.  Cardiovascular: Normal rate, regular rhythm, normal heart sounds and intact distal pulses.  Pulmonary/Chest: Effort normal and breath sounds normal.  Abdominal: Soft. Bowel sounds are normal.  Lymphadenopathy:    She has no cervical  adenopathy.  Neurological: She is alert and oriented to person, place, and time.  Skin: Skin is warm and dry. Capillary refill takes less than 2 seconds.  Nail pitting present in several fingernails.   Psychiatric: She has a normal mood and affect. Her behavior is normal.  Nursing note and vitals reviewed.    Musculoskeletal Exam: C-spine, thoracic, and lumbar spine good ROM.  Shoulder joints, elbow joints, wrist joints good ROM with no synovitis.  MCPs, PIPs, and DIPs limited ROM due to previous surgeries and pin placement.  She has no synovitis.  She has some PIP and DIP synovial thickening consistent with osteoarthritis.  Hip joints, knee joints, ankle joints, MTPs, PIPs, DIPs good ROM with no synovitis.  No warmth or effusion of knees.  No knee crepitus. No tenderness of trochanteric bursa.      CDAI Exam: No CDAI exam completed.    Investigation:  No additional findings. CBC Latest Ref Rng & Units 10/05/2016 03/31/2016 03/30/2016  WBC 3.8 - 10.8 Thousand/uL 7.7 10.2 8.5  Hemoglobin 11.7 - 15.5 g/dL 11.3(L) 10.2(L) 9.7(L)  Hematocrit 35.0 - 45.0 % 37.3 33.2(L) 31.8(L)  Platelets 140 - 400 Thousand/uL 262 191 162   CMP Latest Ref Rng & Units 10/05/2016 03/31/2016 03/30/2016  Glucose 65 - 99 mg/dL 93 135(H) 159(H)  BUN 7 - 25 mg/dL 12 8 12   Creatinine 0.50 - 1.05 mg/dL 0.65 0.60 0.74  Sodium 135 - 146 mmol/L 141 135 139  Potassium 3.5 - 5.3 mmol/L 4.6 4.1 4.7  Chloride 98 - 110 mmol/L 107 102 104  CO2 20 - 32 mmol/L 28 26 28   Calcium 8.6 - 10.4 mg/dL 10.0 9.1 9.0  Total Protein 6.1 - 8.1 g/dL 7.0 - -  Total Bilirubin 0.2 - 1.2 mg/dL 0.8 - -  Alkaline Phos 38 - 126 U/L - - -  AST 10 - 35 U/L 21 - -  ALT 6 - 29 U/L 29 - -    Imaging: No results found.  Speciality Comments: No specialty comments available.    Procedures:  Large Joint Inj: L knee on 01/11/2017 1:44 PM Indications: pain Details: 27 G 1.5 in needle, medial approach  Arthrogram: No  Medications: 1.5 mL lidocaine  1 %; 40 mg triamcinolone acetonide 40 MG/ML Aspirate: 0 mL Outcome: tolerated well, no immediate complications Procedure, treatment alternatives, risks and benefits explained, specific risks discussed. Consent was given by the patient. Immediately prior to procedure a time out was called to verify the correct patient, procedure, equipment, support staff and site/side marked as required. Patient was prepped and draped in the usual sterile fashion.     Allergies: Toradol [ketorolac tromethamine]; Dilaudid [hydromorphone hcl]; Penicillins; and Sulfa antibiotics   Assessment / Plan:     Visit Diagnoses: Fibromyalgia - She has been having increased pain her bilateral leg muscles.  She has muscle tenderness and restless leg symptoms that are worse at night.  She requested initiating Gabapentin for her leg pain.  She has been taking Tramadol 100 mg TID for pain relief and still has a pain level of 6/10.  She was given a prescription for Gabapentin 100 mg, two tablets po at bedtime. She was advised to start with gabapentin 1 tablet bedtime if tolerated she can increase it to 2 tablets at bedtime. She was given 1 month supply with 2 refills.  Primary osteoarthritis of both hands -  She'll be seeing Dr. Burney Gauze.  She has PIP and DIP synovial thickening. She has no hand pain or swelling at this time.   Primary osteoarthritis of left knee - She is on Celebrex per Dr. Durward Fortes for knee joint discomfort.  Left knee cortisone injection performed today.  She tolerated the procedure well.  Chronic pain of left knee: She has no crepitus, effusion, or warmth on exam.  She requested a cortisone injection of her left knee.  She previously had a cortisone injection in August 2018 with Dr. Durward Fortes.  She hopes to have her left knee replaced by Dr. Durward Fortes in June 2019.   S/P total knee replacement using cement, right: Doing very well.  No effusion or warmth.    Medication monitoring encounter - She is on tramadol  for pain management.UDS: 10/3/2018Narc agreement: 05/31/2016.  She was given a one month supply of Tramadol.    Other medical conditions are listed as follows:   History of coronary artery disease - status post stent  placement  History of Graves' disease  Dyslipidemia  History of thalassemia minor  History of hypertension  History of cholecystectomy    Orders: Orders Placed This Encounter  Procedures  . Large Joint Inj: L knee   Meds ordered this encounter  Medications  . gabapentin (NEURONTIN) 100 MG capsule    Sig: Take two tablets by mouth daily at bedtime.    Dispense:  60 capsule    Refill:  2  . traMADol (ULTRAM) 50 MG tablet    Sig: TAKE 2 TABLETS BY MOUTH 3 TIMES A DAY AS NEEDED    Dispense:  180 tablet    Refill:  0    Do not fill until Monday, January 16, 2017    Face-to-face time spent with patient was 30 minutes. Greater than 50% of time was spent in counseling and coordination of care.  Follow-Up Instructions: Return in about 6 months (around 07/11/2017) for Fibromyalgia, Osteoarthritis.   Bo Merino, MD  Note - This record has been created using Editor, commissioning.  Chart creation errors have been sought, but may not always  have been located. Such creation errors do not reflect on  the standard of medical care.

## 2017-02-10 ENCOUNTER — Ambulatory Visit (INDEPENDENT_AMBULATORY_CARE_PROVIDER_SITE_OTHER): Payer: BLUE CROSS/BLUE SHIELD | Admitting: Orthopaedic Surgery

## 2017-02-15 ENCOUNTER — Other Ambulatory Visit: Payer: Self-pay | Admitting: Rheumatology

## 2017-02-15 ENCOUNTER — Other Ambulatory Visit: Payer: Self-pay | Admitting: *Deleted

## 2017-02-15 MED ORDER — TRAMADOL HCL 50 MG PO TABS: ORAL_TABLET | ORAL | 0 refills | Status: DC

## 2017-02-15 NOTE — Telephone Encounter (Signed)
Ok to refill 

## 2017-02-15 NOTE — Telephone Encounter (Signed)
Patient requested a prescription refill of Tramadol 90 day supply.  [facility] [place].  Please call # [phone]

## 2017-02-15 NOTE — Telephone Encounter (Signed)
Last Visit: 01/11/17 Next Visit: 07/18/17 UDS: 10/05/16 Narc Agreement: 05/31/16  Okay to refill Tramadol?

## 2017-02-21 ENCOUNTER — Telehealth: Payer: Self-pay

## 2017-02-21 NOTE — Telephone Encounter (Signed)
Received a fax from [facility] regarding a prior authorization approval for TRAMADOL from 02/21/2017 to 02/21/2018.   Reference [id number] Phone number:N/A  Will send document to scan center.  Called pt to update. Pt voices understanding and denies any questions at this time.  Charlet Harr, [place], CPhT  3:37 PM

## 2017-02-21 NOTE — Telephone Encounter (Signed)
Received a fax from [facility] requesting a prior authorization for Tramadol. Authorization form completed and faxed back. Will update once we receive a response.   Makenzy Krist, [place], CPhT 11:27 AM

## 2017-03-08 ENCOUNTER — Other Ambulatory Visit: Payer: Self-pay | Admitting: Rheumatology

## 2017-03-08 NOTE — Telephone Encounter (Signed)
Last Visit: 01/11/17 Next Visit: 07/18/17  Okay to refill per Dr. Estanislado Pandy

## 2017-03-15 ENCOUNTER — Telehealth: Payer: Self-pay | Admitting: Rheumatology

## 2017-03-15 NOTE — Telephone Encounter (Signed)
Patient request 90 day supply refill on Tramadol if possible. Patient will be on vacation. [facility] [place] [place]

## 2017-03-15 NOTE — Telephone Encounter (Signed)
Last Visit: 01/11/17 Next Visit: 07/18/17 UDS: 10/05/16 Narc Agreement: 05/31/16  Patient is requesting a 90 day supply.   Okay to refill Tramadol?

## 2017-03-16 MED ORDER — TRAMADOL HCL 50 MG PO TABS: ORAL_TABLET | ORAL | 0 refills | Status: DC

## 2017-04-04 ENCOUNTER — Other Ambulatory Visit: Payer: Self-pay | Admitting: Physician Assistant

## 2017-04-04 NOTE — Telephone Encounter (Signed)
Last Visit: 01/11/17 Next Visit: 07/18/17  Okay to refill per Dr. Estanislado Pandy

## 2017-04-05 ENCOUNTER — Ambulatory Visit: Payer: BLUE CROSS/BLUE SHIELD | Admitting: Rheumatology

## 2017-04-12 ENCOUNTER — Other Ambulatory Visit: Payer: Self-pay | Admitting: Rheumatology

## 2017-04-12 MED ORDER — TRAMADOL HCL 50 MG PO TABS: ORAL_TABLET | ORAL | 0 refills | Status: DC

## 2017-04-12 NOTE — Telephone Encounter (Signed)
Patient called requesting prescription refill of Tramadol.  [facility] [address] in [place].  Patient states she needs to pick up the prescription by Friday, 4/12 due to going out of town for a couple of weeks.

## 2017-04-12 NOTE — Telephone Encounter (Signed)
Patient states she needs to pick up the prescription by Friday, 4/12 due to going out of town for a couple of weeks.    Last Visit: 01/11/17 Next Visit: 07/18/17 UDS: 10/05/16 Narc Agreement: 05/31/16  Last Fill: 03/16/17  Okay to refill Tramadol?

## 2017-04-12 NOTE — Telephone Encounter (Signed)
ok 

## 2017-05-11 ENCOUNTER — Telehealth: Payer: Self-pay | Admitting: Rheumatology

## 2017-05-11 DIAGNOSIS — Z5181 Encounter for therapeutic drug level monitoring: Secondary | ICD-10-CM

## 2017-05-11 NOTE — Telephone Encounter (Signed)
Last Visit: 01/11/17 Next Visit: 07/18/17 UDS: 10/05/16 Narc Agreement: 05/31/16

## 2017-05-11 NOTE — Telephone Encounter (Signed)
Patient called requesting prescription refill of Tramadol.  [facility] [address] in [place].

## 2017-05-11 NOTE — Telephone Encounter (Signed)
Patient will update UDS 05/12/17. Will refill after UDS.

## 2017-05-12 ENCOUNTER — Other Ambulatory Visit: Payer: Self-pay

## 2017-05-12 ENCOUNTER — Other Ambulatory Visit: Payer: Self-pay | Admitting: *Deleted

## 2017-05-12 DIAGNOSIS — Z5181 Encounter for therapeutic drug level monitoring: Secondary | ICD-10-CM

## 2017-05-12 MED ORDER — TRAMADOL HCL 50 MG PO TABS: ORAL_TABLET | ORAL | 0 refills | Status: DC

## 2017-05-12 NOTE — Progress Notes (Signed)
Office Visit Note  Patient: Shannon Jennings             Date of Birth: 1960/11/08           MRN: [id number]             PCP: Delanna Notice, MD Referring: Delanna Notice, MD Visit Date: 05/25/2017 Occupation: @GUAROCC @    Subjective:  Left knee pain.   History of Present Illness: Shannon Jennings is a 57 y.o. female with history of osteoarthritis and fibromyalgia.  She states she has been having pain and discomfort in her left knee joint.  She is having crepitus and joint swelling.  She would like to have cortisone injection.  Her right total knee replacement is doing well.  She recently had fusion of her left first DIP joint by Dr. Burney Gauze.  She continues to have discomfort in her hands.  He states that her fibromyalgia is getting worse and she gets one third of the month painful days.  Activities of Daily Living:  Patient reports morning stiffness for 40 minutes.   Patient Reports nocturnal pain.  Difficulty dressing/grooming: Denies Difficulty climbing stairs: Reports Difficulty getting out of chair: Denies Difficulty using hands for taps, buttons, cutlery, and/or writing: Reports   Review of Systems  Constitutional: Positive for fatigue. Negative for activity change, night sweats, weight gain and weight loss.  HENT: Negative for mouth sores, trouble swallowing, trouble swallowing, mouth dryness and nose dryness.   Eyes: Negative for pain, redness, visual disturbance and dryness.  Respiratory: Negative for cough, shortness of breath and difficulty breathing.   Cardiovascular: Negative for chest pain, palpitations, hypertension, irregular heartbeat and swelling in legs/feet.  Gastrointestinal: Negative for blood in stool, constipation and diarrhea.  Endocrine: Negative for increased urination.  Genitourinary: Negative for difficulty urinating and vaginal dryness.  Musculoskeletal: Positive for arthralgias, joint pain, joint swelling, muscle weakness, morning stiffness and muscle  tenderness. Negative for myalgias and myalgias.  Skin: Negative for color change, rash, hair loss, skin tightness, ulcers and sensitivity to sunlight.  Allergic/Immunologic: Negative for susceptible to infections.  Neurological: Negative for dizziness, memory loss, night sweats and weakness.  Hematological: Positive for bruising/bleeding tendency. Negative for swollen glands.  Psychiatric/Behavioral: Positive for sleep disturbance. Negative for depressed mood. The patient is not nervous/anxious.     PMFS History:  Patient Active Problem List   Diagnosis Date Noted  . Primary osteoarthritis of left knee 05/30/2016  . S/P total knee replacement using cement, right 04/12/2016  . Fibromyalgia 01/22/2016  . Primary osteoarthritis of both hands 01/22/2016  . Essential hypertension 01/22/2016  . History of coronary artery disease 01/22/2016  . History of Graves' disease 01/22/2016  . Dyslipidemia 01/22/2016  . History of thalassemia minor 01/22/2016    Past Medical History:  Diagnosis Date  . Abnormally small mouth   . Complication of anesthesia   . Coronary artery disease   . 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 Difficult intubation 07/2012  . Fibromyalgia   . High cholesterol   . History of Graves' disease   . History of migraine   . Hypertension    under control with med., has been on med. x 4 yr.  . Hypothyroidism   . Osteoarthritis   . S/P coronary artery stent placement 04/2011   states right coronary artery  . Thalassemia minor     Family History  Problem Relation Age of Onset  . Heart disease Mother   .  Arthritis Mother   . Cancer Sister   . Seizures Sister   . Arthritis Brother   . Diabetes Brother    Past Surgical History:  Procedure Laterality Date  . ABDOMINAL HERNIA REPAIR  04/2012  . ABDOMINAL SURGERY  01/2011   for intestinal blockage  . APPENDECTOMY  1984  . CARDIAC CATHETERIZATION    . CHOLECYSTECTOMY  07/2012  .  COLONOSCOPY    . CORONARY ANGIOPLASTY  04/2011   stent x 1  . DISTAL INTERPHALANGEAL JOINT FUSION Bilateral 12/25/2012   Procedure: DISTAL INTERPHALANGEAL JOINT FUSION RIGHT AND LEFT SMALL FINGER;  Surgeon: Cammie Sickle., MD;  Location: [facility];  Service: Orthopedics;  Laterality: Bilateral;  left and right small finger  . ESOPHAGOGASTRODUODENOSCOPY    . FINGER SURGERY    . HAND SURGERY  2014   Bilateral pinky finger distal joint fusion  . HAND SURGERY  2016   Right Thumb reconstruction and left middle finger distal joint fusion  . HAND SURGERY     Scar tissue removal right palm  . HERNIA REPAIR    . KNEE ARTHROPLASTY    . OVARIAN CYST REMOVAL    . TOTAL KNEE ARTHROPLASTY Right 03/29/2016  . TOTAL KNEE ARTHROPLASTY Right 03/29/2016   Procedure: TOTAL KNEE ARTHROPLASTY;  Surgeon: Garald Balding, MD;  Location: [facility];  Service: Orthopedics;  Laterality: Right;  . TUBAL LIGATION Bilateral 1993  . WISDOM TOOTH EXTRACTION     Social History   Social History Narrative  . Not on file     Objective: Vital Signs: BP 125/75 (BP Location: Left Arm, Patient Position: Sitting, Cuff Size: Normal)   Pulse 81   Resp 16   Ht 5\' 3"  (1.6 m)   Wt 162 lb (73.5 kg)   BMI 28.70 kg/m    Physical Exam  Constitutional: She is oriented to person, place, and time. She appears well-developed and well-nourished.  HENT:  Head: Normocephalic and atraumatic.  Eyes: Conjunctivae and EOM are normal.  Neck: Normal range of motion.  Cardiovascular: Normal rate, regular rhythm, normal heart sounds and intact distal pulses.  Pulmonary/Chest: Effort normal and breath sounds normal.  Abdominal: Soft. Bowel sounds are normal.  Lymphadenopathy:    She has no cervical adenopathy.  Neurological: She is alert and oriented to person, place, and time.  Skin: Skin is warm and dry. Capillary refill takes less than 2 seconds.  Psychiatric: She has a normal mood and affect. Her behavior is  normal.  Nursing note and vitals reviewed.    Musculoskeletal Exam: C-spine thoracic lumbar spine good range of motion.  Shoulder joints elbow joints wrist joint MCPs PIPs been good range of motion.  She has PIP/DIP thickening in her bilateral hands.  She had recent fusion of her left first DIP which is a still somewhat swollen.  Hip joints were in good range of motion.  Her right total knee replacement is doing well.  She has some crepitus in her left knee joint without any warmth swelling or effusion.  All other joints full range of motion.  She has some generalized hyperalgesia.  CDAI Exam: No CDAI exam completed.    Investigation: No additional findings. CBC Latest Ref Rng & Units 10/05/2016 03/31/2016 03/30/2016  WBC 3.8 - 10.8 Thousand/uL 7.7 10.2 8.5  Hemoglobin 11.7 - 15.5 g/dL 11.3(L) 10.2(L) 9.7(L)  Hematocrit 35.0 - 45.0 % 37.3 33.2(L) 31.8(L)  Platelets 140 - 400 Thousand/uL 262 191 162   CMP Latest Ref  Rng & Units 10/05/2016 03/31/2016 03/30/2016  Glucose 65 - 99 mg/dL 93 135(H) 159(H)  BUN 7 - 25 mg/dL 12 8 12   Creatinine 0.50 - 1.05 mg/dL 0.65 0.60 0.74  Sodium 135 - 146 mmol/L 141 135 139  Potassium 3.5 - 5.3 mmol/L 4.6 4.1 4.7  Chloride 98 - 110 mmol/L 107 102 104  CO2 20 - 32 mmol/L 28 26 28   Calcium 8.6 - 10.4 mg/dL 10.0 9.1 9.0  Total Protein 6.1 - 8.1 g/dL 7.0 - -  Total Bilirubin 0.2 - 1.2 mg/dL 0.8 - -  Alkaline Phos 38 - 126 U/L - - -  AST 10 - 35 U/L 21 - -  ALT 6 - 29 U/L 29 - -    Imaging: No results found.  Speciality Comments: No specialty comments available.    Procedures:  Large Joint Inj: L knee on 05/25/2017 1:27 PM Indications: pain Details: 27 G 1.5 in needle, medial approach  Arthrogram: No  Medications: 1.5 mL lidocaine 1 %; 40 mg triamcinolone acetonide 40 MG/ML Aspirate: 0 mL Outcome: tolerated well, no immediate complications Procedure, treatment alternatives, risks and benefits explained, specific risks discussed. Consent was given  by the patient. Immediately prior to procedure a time out was called to verify the correct patient, procedure, equipment, support staff and site/side marked as required. Patient was prepped and draped in the usual sterile fashion.     Allergies: Toradol [ketorolac tromethamine]; Dilaudid [hydromorphone hcl]; Penicillins; and Sulfa antibiotics   Assessment / Plan:     Visit Diagnoses: Fibromyalgia-she continues to have some generalized pain and discomfort and positive tender points.  She has been having more frequent flares lately.  She believes that Flexeril makes her very groggy at night.  We had detailed discussion and discontinued Flexeril.  Have given her tizanidine 4 mg p.o. nightly as needed.  She continues to have some stiffness and pain in her bilateral hands.  Need for regular exercise was discussed.  Patient states that she will be joining pool therapy soon.  Primary osteoarthritis of both hands-she recently had a left first PIP fusion which is doing well.  She does have ongoing discomfort in her hands due to osteoarthritis.  Joint protection muscle strengthening was discussed.  Primary osteoarthritis of left knee-she was having increased pain and discomfort in her left knee joint without any warmth or swelling.  She states she will get total knee replacement next year sometimes.  Per her request after informed consent was obtained left knee joint was injected with cortisone which she tolerated well.  S/P total knee replacement using cement, right-doing well.  History of thalassemia minor  History of Graves' disease  History of coronary artery disease  Dyslipidemia  Essential hypertension    Orders: Orders Placed This Encounter  Procedures  . Large Joint Inj   Meds ordered this encounter  Medications  . tiZANidine (ZANAFLEX) 4 MG tablet    Sig: One tablet p.o. nightly    Dispense:  30 tablet    Refill:  2    Face-to-face time spent with patient was 30 minutes. >50% of  time was spent in counseling and coordination of care.  Follow-Up Instructions: Return in about 6 months (around 11/25/2017) for FMS, OA.   Bo Merino, MD  Note - This record has been created using Editor, commissioning.  Chart creation errors have been sought, but may not always  have been located. Such creation errors do not reflect on  the standard of medical care.

## 2017-05-15 NOTE — Progress Notes (Signed)
Pt had surgery and was on Oxycodone.

## 2017-05-20 LAB — PAIN MGMT, PROFILE 5 W/CONF, U
ALPHAHYDROXYALPRAZOLAM: NEGATIVE ng/mL (ref <25)
ALPHAHYDROXYTRIAZOLAM: NEGATIVE ng/mL (ref <50)
Alphahydroxymidazolam: NEGATIVE ng/mL (ref <50)
Aminoclonazepam: NEGATIVE ng/mL (ref <25)
Amphetamines: NEGATIVE ng/mL (ref <500)
BARBITURATES: NEGATIVE ng/mL (ref <300)
Benzodiazepines: NEGATIVE ng/mL (ref <100)
COCAINE METABOLITE: NEGATIVE ng/mL (ref <150)
Creatinine: 227.2 mg/dL
HYDROXYETHYLFLURAZEPAM: NEGATIVE ng/mL (ref <50)
Lorazepam: NEGATIVE ng/mL (ref <50)
Marijuana Metabolite: NEGATIVE ng/mL (ref <20)
Methadone Metabolite: NEGATIVE ng/mL (ref <100)
Nordiazepam: NEGATIVE ng/mL (ref <50)
Noroxycodone: 1065 ng/mL — ABNORMAL HIGH (ref <50)
OXYCODONE: 160 ng/mL — AB (ref <50)
OXYCODONE: POSITIVE ng/mL — AB (ref <100)
OXYMORPHONE: 192 ng/mL — AB (ref <50)
Opiates: NEGATIVE ng/mL (ref <100)
Oxazepam: NEGATIVE ng/mL (ref <50)
Oxidant: NEGATIVE ug/mL (ref <200)
PH: 5.74 (ref 4.5–9.0)
Temazepam: NEGATIVE ng/mL (ref <50)

## 2017-05-20 LAB — PAIN MGMT, TRAMADOL W/MEDMATCH, U: Desmethyltramadol: >10000 ng/mL — ABNORMAL HIGH (ref <100)

## 2017-05-25 ENCOUNTER — Encounter: Payer: Self-pay | Admitting: Physician Assistant

## 2017-05-25 ENCOUNTER — Ambulatory Visit (INDEPENDENT_AMBULATORY_CARE_PROVIDER_SITE_OTHER): Payer: BLUE CROSS/BLUE SHIELD | Admitting: Physician Assistant

## 2017-05-25 ENCOUNTER — Telehealth: Payer: Self-pay | Admitting: Rheumatology

## 2017-05-25 VITALS — BP 125/75 | HR 81 | Resp 16 | Ht 63.0 in | Wt 162.0 lb

## 2017-05-25 DIAGNOSIS — M19041 Primary osteoarthritis, right hand: Secondary | ICD-10-CM

## 2017-05-25 DIAGNOSIS — E785 Hyperlipidemia, unspecified: Secondary | ICD-10-CM | POA: Diagnosis not present

## 2017-05-25 DIAGNOSIS — Z96651 Presence of right artificial knee joint: Secondary | ICD-10-CM

## 2017-05-25 DIAGNOSIS — M19042 Primary osteoarthritis, left hand: Secondary | ICD-10-CM | POA: Diagnosis not present

## 2017-05-25 DIAGNOSIS — M797 Fibromyalgia: Secondary | ICD-10-CM

## 2017-05-25 DIAGNOSIS — Z8639 Personal history of other endocrine, nutritional and metabolic disease: Secondary | ICD-10-CM | POA: Diagnosis not present

## 2017-05-25 DIAGNOSIS — Z8679 Personal history of other diseases of the circulatory system: Secondary | ICD-10-CM

## 2017-05-25 DIAGNOSIS — Z862 Personal history of diseases of the blood and blood-forming organs and certain disorders involving the immune mechanism: Secondary | ICD-10-CM

## 2017-05-25 DIAGNOSIS — I10 Essential (primary) hypertension: Secondary | ICD-10-CM | POA: Diagnosis not present

## 2017-05-25 DIAGNOSIS — M1712 Unilateral primary osteoarthritis, left knee: Secondary | ICD-10-CM | POA: Diagnosis not present

## 2017-05-25 MED ORDER — TIZANIDINE HCL 4 MG PO TABS: ORAL_TABLET | ORAL | 2 refills | Status: DC

## 2017-05-25 MED ORDER — LIDOCAINE HCL 1 % IJ SOLN
1.5000 mL | INTRAMUSCULAR | Status: AC | PRN
  Administered 2017-05-25: 1.5 mL

## 2017-05-25 MED ORDER — TRIAMCINOLONE ACETONIDE 40 MG/ML IJ SUSP
40.0000 mg | INTRAMUSCULAR | Status: AC | PRN
  Administered 2017-05-25: 40 mg via INTRA_ARTICULAR

## 2017-05-25 NOTE — Telephone Encounter (Signed)
I spoke with the pharmacist.  He states the patient picked up the prescription for Zanaflex.  She was clear that she would not be taking Flexeril while she is taking Zanaflex.  She was also aware that Flexeril interacts with tramadol.  She discontinued Flexeril due to hangover effect.

## 2017-05-25 NOTE — Telephone Encounter (Signed)
Shannon Jennings, pharmacist at [facility] in [place] called stating that he has received the prescription of Zanaflex.  He states he recently filled:  Gabapentin, Tramadol, and Flexeril.   He is requesting a return call [phone]

## 2017-06-08 ENCOUNTER — Telehealth: Payer: Self-pay | Admitting: Rheumatology

## 2017-06-08 NOTE — Telephone Encounter (Signed)
Patient called requesting prescription refill of Tramadol.  Patient states she also met with her cardiologist who stated that she should discontinue taking the Tizanidine due to causing chest pain and  Flexeril to Zanadine chest pain and arhythmia  [facility] [address] [facility] [place].

## 2017-06-08 NOTE — Telephone Encounter (Signed)
Last Visit: 05/25/17 Next Visit: 11/22/17 UDS: 05/12/17 Narc Agreement: 05/12/17  Patient states Dr. Estanislado Pandy changed her medication from Flexeril to Tizanidine but it caused her chest pain and Arhythmia so her cardiologist told her to discontinue taking the Tizanidine  Okay to refill tramadol and flexeril?

## 2017-06-08 NOTE — Telephone Encounter (Signed)
Okay to refill tramadol and Flexeril.  She may consider reducing Flexeril to 5 mg p.o. nightly as needed.

## 2017-06-08 NOTE — Telephone Encounter (Signed)
Patient called requesting prescription refill of Tramadol and Flexeril.  Patient states Dr. Estanislado Pandy changed her medication from Flexeril to Tizanidine but it caused her chest pain and Arhythmia so her cardiologist told her to discontinue taking the Tizanidine.  Patient requests [facility] Tr[facility]ad[facility] [address] [facility] [place].

## 2017-06-08 NOTE — Telephone Encounter (Signed)
Patient called requesting prescription refill of Tramadol.  Patient states she also met with her cardiologist who stated that she should discontinue taking the Tizanidine due to causing chest pain and  Flexeril to Zanadine chest pain and arythmia  [facility] [address] in [place].

## 2017-06-09 MED ORDER — TRAMADOL HCL 50 MG PO TABS: ORAL_TABLET | ORAL | 0 refills | Status: DC

## 2017-06-09 MED ORDER — CYCLOBENZAPRINE HCL 5 MG PO TABS: 5.0000 mg | ORAL_TABLET | Freq: Every day | ORAL | 2 refills | Status: DC

## 2017-06-29 ENCOUNTER — Telehealth: Payer: Self-pay | Admitting: Rheumatology

## 2017-06-29 NOTE — Telephone Encounter (Signed)
Patient called requesting prescription refill of Tramad[facility] [address] in [place]. °

## 2017-06-29 NOTE — Telephone Encounter (Signed)
Patient advised unable to refill at this time as it is to early.

## 2017-07-03 ENCOUNTER — Other Ambulatory Visit: Payer: Self-pay | Admitting: Rheumatology

## 2017-07-03 NOTE — Telephone Encounter (Signed)
Last Visit: 05/25/17 Next Visit: 11/22/17  Okay to refill per Dr. Estanislado Pandy

## 2017-07-05 ENCOUNTER — Other Ambulatory Visit: Payer: Self-pay | Admitting: Rheumatology

## 2017-07-05 MED ORDER — TRAMADOL HCL 50 MG PO TABS: ORAL_TABLET | ORAL | 0 refills | Status: DC

## 2017-07-05 NOTE — Telephone Encounter (Signed)
Patient called requesting prescription refill of Tramad[facility] [address] in [place]. °

## 2017-07-05 NOTE — Telephone Encounter (Signed)
Last Visit: 05/25/17 Next Visit: 11/22/17 UDS: 05/12/17 Narc Agreement: 05/12/17  Okay to refill per Tramadol?

## 2017-07-07 MED ORDER — GABAPENTIN 100 MG PO CAPS: ORAL_CAPSULE | ORAL | 2 refills | Status: DC

## 2017-07-18 ENCOUNTER — Ambulatory Visit: Payer: BLUE CROSS/BLUE SHIELD | Admitting: Rheumatology

## 2017-08-03 ENCOUNTER — Other Ambulatory Visit: Payer: Self-pay | Admitting: Rheumatology

## 2017-08-03 MED ORDER — TRAMADOL HCL 50 MG PO TABS: ORAL_TABLET | ORAL | 0 refills | Status: DC

## 2017-08-03 NOTE — Telephone Encounter (Signed)
Patient requests a refill on Tramadol sent to [facility] in [place].

## 2017-08-03 NOTE — Telephone Encounter (Signed)
Last visit: 05/25/2017 Next visit: 11/22/2017 UDS: 05/12/2017 Narc agreement: 05/12/2017  Last fill: 07/05/2017  Okay to refill tramadol?

## 2017-08-03 NOTE — Telephone Encounter (Signed)
ok 

## 2017-08-09 ENCOUNTER — Encounter (INDEPENDENT_AMBULATORY_CARE_PROVIDER_SITE_OTHER): Payer: Self-pay | Admitting: Orthopedic Surgery

## 2017-08-09 ENCOUNTER — Ambulatory Visit (INDEPENDENT_AMBULATORY_CARE_PROVIDER_SITE_OTHER): Payer: Self-pay

## 2017-08-09 ENCOUNTER — Ambulatory Visit (INDEPENDENT_AMBULATORY_CARE_PROVIDER_SITE_OTHER): Payer: BLUE CROSS/BLUE SHIELD | Admitting: Orthopedic Surgery

## 2017-08-09 VITALS — BP 142/81 | HR 70 | Resp 14 | Ht 63.0 in | Wt 161.0 lb

## 2017-08-09 DIAGNOSIS — M7051 Other bursitis of knee, right knee: Secondary | ICD-10-CM | POA: Diagnosis not present

## 2017-08-09 DIAGNOSIS — G8929 Other chronic pain: Secondary | ICD-10-CM | POA: Diagnosis not present

## 2017-08-09 DIAGNOSIS — M25561 Pain in right knee: Secondary | ICD-10-CM | POA: Diagnosis not present

## 2017-08-09 MED ORDER — BUPIVACAINE HCL 0.5 % IJ SOLN
2.0000 mL | INTRAMUSCULAR | Status: AC | PRN
  Administered 2017-08-09: 2 mL via INTRA_ARTICULAR

## 2017-08-09 MED ORDER — LIDOCAINE HCL 1 % IJ SOLN
1.0000 mL | INTRAMUSCULAR | Status: AC | PRN
  Administered 2017-08-09: 1 mL

## 2017-08-09 MED ORDER — METHYLPREDNISOLONE ACETATE 40 MG/ML IJ SUSP
30.0000 mg | INTRAMUSCULAR | Status: AC | PRN
  Administered 2017-08-09: 30 mg via INTRA_ARTICULAR

## 2017-08-09 NOTE — Progress Notes (Signed)
Office Visit Note   Patient: Shannon Jennings           Date of Birth: Oct 04, 1960           MRN: [id number] Visit Date: 08/09/2017              Requested by: Delanna Notice, [place] [address] PCP: Delanna Notice, MD   Assessment & Plan: Visit Diagnoses:  1. Pes anserinus bursitis of right knee   2. Acute pain of right knee   3. Chronic pain of right knee     Plan:  #1: Injection of the right Pes bursa was accomplished atraumatically.  This yielded improvement in the pain and did not have any pain to palpation postinjection. #2: If this returns then I think we need to consider reevaluation.  Follow-Up Instructions: Return if symptoms worsen or fail to improve.   Orders:  Orders Placed This Encounter  Procedures  . Trigger Point Inj  . Large Joint Inj  . XR KNEE 3 VIEW RIGHT   No orders of the defined types were placed in this encounter.     Procedures: Large Joint Inj: R knee on 08/09/2017 12:53 PM Indications: pain and diagnostic evaluation Details: 25 G 1.5 in needle, anteromedial approach  Arthrogram: No  Medications: 2 mL bupivacaine 0.5 %; 1 mL lidocaine 1 %; 30 mg methylPREDNISolone acetate 40 MG/ML Outcome: tolerated well, no immediate complications Procedure, treatment alternatives, risks and benefits explained, specific risks discussed. Consent was given by the patient. Immediately prior to procedure a time out was called to verify the correct patient, procedure, equipment, support staff and site/side marked as required. Patient was prepped and draped in the usual sterile fashion.       Clinical Data: No additional findings.   Subjective: Chief Complaint  Patient presents with  . Right Knee - Pain    S/p TKR 03/2016    HPI  Shannon Jennings is a very pleasant 57 year old white female who is now 16 months status post right total knee arthroplasty.  Approximately 3 weeks ago she started having pain discomfort in the proximal anterior medial aspect  of the tibia.  She does not remember any history of injury or trauma.  She started having some swelling in the area as well as in the knee.  She was concerned about this and had tried local treatments to the area but she still continues to have pain at this area.  Denies any recent history of injury or trauma. No fever shakes or chills.  Review of Systems  Constitutional: Negative for chills and fatigue.  HENT: Negative for tinnitus.   Eyes: Negative for pain and itching.  Respiratory: Negative for chest tightness and shortness of breath.   Cardiovascular: Negative for chest pain.  Gastrointestinal: Negative for abdominal pain, constipation and diarrhea.  Endocrine: Negative for cold intolerance and heat intolerance.  Genitourinary: Negative for frequency and pelvic pain.  Musculoskeletal: Positive for gait problem and joint swelling.  Skin: Negative for rash.  Neurological: Negative for dizziness, weakness and headaches.  Hematological: Bruises/bleeds easily.  Psychiatric/Behavioral: Negative for confusion.     Objective: Vital Signs: BP (!) 142/81 (BP Location: Left Arm, Patient Position: Sitting, Cuff Size: Normal)   Pulse 70   Resp 14   Ht 5\' 3"  (1.6 m)   BMI 28.70 kg/m   Physical Exam  Constitutional: She is oriented to person, place, and time. She appears well-developed and well-nourished.  HENT:  Mouth/Throat: Oropharynx is clear  and moist.  Eyes: Pupils are equal, round, and reactive to light. EOM are normal.  Pulmonary/Chest: Effort normal.  Neurological: She is alert and oriented to person, place, and time.  Skin: Skin is warm and dry.  Psychiatric: She has a normal mood and affect. Her behavior is normal.    Ortho Exam  She has good range of motion of the right knee from near full extension to around 95 to 105 degrees of flexion.  She has some puffiness over the proximal medial aspect of the tibia.  She has good ligament stability of the total knee.  A little bit of  medial joint line pain more anteriorly.  Overall a very stable the construct.  Specialty Comments:  No specialty comments available.  Imaging: Xr Knee 3 View Right  Result Date: 08/09/2017 Three-view x-ray of the right knee reveals total knee replacement in good position alignment with excellent cement mantle.  No radiolucencies.  Do not see any osseous abnormality at the marker site.    PMFS History: Current Outpatient Medications  Medication Sig Dispense Refill  . aspirin EC 81 MG tablet Take by mouth.    . celecoxib (CELEBREX) 200 MG capsule TAKE ONE CAPSULE BY MOUTH ONCE DAILY WITH FOOD (Patient taking differently: as needed. TAKE ONE CAPSULE BY MOUTH ONCE DAILY WITH FOOD) 90 capsule 4  . Cholecalciferol (VITAMIN D) 2000 units CAPS Take 2,000 Units by mouth daily.    . cyclobenzaprine (FLEXERIL) 5 MG tablet Take 1 tablet (5 mg total) by mouth at bedtime. 30 tablet 2  . Fe Fum-FA-B Cmp-C-Zn-Mg-Mn-Cu (HEMOCYTE PLUS) 106-1 MG CAPS Take 1 capsule by mouth every evening.   5  . gabapentin (NEURONTIN) 100 MG capsule TAKE 2 CAPSULES BY MOUTH EVERY DAY AT BEDTIME 60 capsule 2  . Krill Oil 1000 MG CAPS Take by mouth.    . levothyroxine (SYNTHROID, LEVOTHROID) 112 MCG tablet Take 112 mcg by mouth daily before breakfast.    . methocarbamol (ROBAXIN) 500 MG tablet TAKE 1 TABLET BY MOUTH TWICE A DAY AS NEEDED 180 tablet 1  . metoprolol (LOPRESSOR) 50 MG tablet Take 50 mg by mouth 2 (two) times daily with a meal.  6  . rosuvastatin (CRESTOR) 10 MG tablet Take 10 mg by mouth daily.  12  . SUMAtriptan (IMITREX) 50 MG tablet Take 25 mg by mouth every 2 (two) hours as needed for migraine.     . traMADol (ULTRAM) 50 MG tablet TAKE 2 TABLETS BY MOUTH 3 TIMES A DAY AS NEEDED 180 tablet 0  . YUVAFEM 10 MCG TABS vaginal tablet 2 (two) times a week.     Marland Kitchen atorvastatin (LIPITOR) 80 MG tablet Take 80 mg by mouth daily.  8  . celecoxib (CELEBREX) 200 MG capsule Take 1 capsule (200 mg total) by mouth as needed.  (Patient not taking: Reported on 08/09/2017) 30 capsule 0  . omeprazole (PRILOSEC) 40 MG capsule Take by mouth.    Marland Kitchen tiZANidine (ZANAFLEX) 4 MG tablet One tablet p.o. nightly (Patient not taking: Reported on 08/09/2017) 30 tablet 2  . zolpidem (AMBIEN) 5 MG tablet Take 1 tablet (5 mg total) by mouth at bedtime as needed for sleep. (Patient not taking: Reported on 08/09/2017) 30 tablet 5   No current facility-administered medications for this visit.     Patient Active Problem List   Diagnosis Date Noted  . Primary osteoarthritis of left knee 05/30/2016  . S/P total knee replacement using cement, right 04/12/2016  . Fibromyalgia 01/22/2016  .  Primary osteoarthritis of both hands 01/22/2016  . Essential hypertension 01/22/2016  . History of coronary artery disease 01/22/2016  . History of Marcedes Tech' disease 01/22/2016  . Dyslipidemia 01/22/2016  . History of thalassemia minor 01/22/2016   Past Medical History:  Diagnosis Date  . Abnormally small mouth   . Complication of anesthesia   . Coronary artery disease   . 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 Difficult intubation 07/2012  . Fibromyalgia   . High cholesterol   . History of Leilanie Rauda' disease   . History of migraine   . Hypertension    under control with med., has been on med. x 4 yr.  . Hypothyroidism   . Osteoarthritis   . S/P coronary artery stent placement 04/2011   states right coronary artery  . Thalassemia minor     Family History  Problem Relation Age of Onset  . Heart disease Mother   . Arthritis Mother   . Cancer Sister   . Seizures Sister   . Arthritis Brother   . Diabetes Brother     Past Surgical History:  Procedure Laterality Date  . ABDOMINAL HERNIA REPAIR  04/2012  . ABDOMINAL SURGERY  01/2011   for intestinal blockage  . APPENDECTOMY  1984  . CARDIAC CATHETERIZATION    . CHOLECYSTECTOMY  07/2012  . COLONOSCOPY    . CORONARY ANGIOPLASTY  04/2011   stent x 1  . DISTAL  INTERPHALANGEAL JOINT FUSION Bilateral 12/25/2012   Procedure: DISTAL INTERPHALANGEAL JOINT FUSION RIGHT AND LEFT SMALL FINGER;  Surgeon: Cammie Sickle., MD;  Location: [facility];  Service: Orthopedics;  Laterality: Bilateral;  left and right small finger  . ESOPHAGOGASTRODUODENOSCOPY    . FINGER SURGERY    . HAND SURGERY  2014   Bilateral pinky finger distal joint fusion  . HAND SURGERY  2016   Right Thumb reconstruction and left middle finger distal joint fusion  . HAND SURGERY     Scar tissue removal right palm  . HERNIA REPAIR    . KNEE ARTHROPLASTY    . OVARIAN CYST REMOVAL    . TOTAL KNEE ARTHROPLASTY Right 03/29/2016  . TOTAL KNEE ARTHROPLASTY Right 03/29/2016   Procedure: TOTAL KNEE ARTHROPLASTY;  Surgeon: Garald Balding, MD;  Location: [facility];  Service: Orthopedics;  Laterality: Right;  . TUBAL LIGATION Bilateral 1993  . WISDOM TOOTH EXTRACTION     Social History   Occupational History  . Not on file  Tobacco Use  . Smoking status: Never Smoker  . Smokeless tobacco: Never Used  Substance and Sexual Activity  . Alcohol use: No  . Drug use: No  . Sexual activity: Not on file

## 2017-08-18 ENCOUNTER — Other Ambulatory Visit (INDEPENDENT_AMBULATORY_CARE_PROVIDER_SITE_OTHER): Payer: Self-pay | Admitting: Radiology

## 2017-08-18 ENCOUNTER — Telehealth (INDEPENDENT_AMBULATORY_CARE_PROVIDER_SITE_OTHER): Payer: Self-pay | Admitting: Orthopaedic Surgery

## 2017-08-18 MED ORDER — METHYLPREDNISOLONE 4 MG PO TABS: ORAL_TABLET | ORAL | 0 refills | Status: DC

## 2017-08-18 NOTE — Telephone Encounter (Signed)
CALLED [facility] IN AND NOTIFIED PT 

## 2017-08-18 NOTE — Telephone Encounter (Signed)
Please advise 

## 2017-08-18 NOTE — Telephone Encounter (Signed)
Medrol dose pack

## 2017-08-18 NOTE — Telephone Encounter (Signed)
Patient called stating she had a cortisone injection with Aaron Edelman on 08/09/17 and it only helped for 2 days.  Patient is requesting a prescription of Prednisone low dose [facility] [address] in [place].

## 2017-08-18 NOTE — Telephone Encounter (Signed)
Please see note below and send prescription. Thank you.

## 2017-08-22 ENCOUNTER — Ambulatory Visit (INDEPENDENT_AMBULATORY_CARE_PROVIDER_SITE_OTHER): Payer: BLUE CROSS/BLUE SHIELD | Admitting: Orthopaedic Surgery

## 2017-08-22 ENCOUNTER — Encounter (INDEPENDENT_AMBULATORY_CARE_PROVIDER_SITE_OTHER): Payer: Self-pay | Admitting: Orthopaedic Surgery

## 2017-08-22 VITALS — BP 143/85 | HR 76 | Ht 63.0 in | Wt 148.0 lb

## 2017-08-22 DIAGNOSIS — Z96651 Presence of right artificial knee joint: Secondary | ICD-10-CM

## 2017-08-22 NOTE — Progress Notes (Signed)
Office Visit Note   Patient: Shannon Jennings           Date of Birth: [birth year]           MRN: [id number] Visit Date: 08/22/2017              Requested by: Delanna Notice, [place] [address] PCP: Delanna Notice, MD   Assessment & Plan: Visit Diagnoses:  1. History of total right knee replacement     Plan: Mrs. Staton is nearly a year and a half status post primary right total knee replacement and has been doing well up until just recently when she developed some pain along the medial proximal tibia bilaterally.  She saw Aaron Edelman last week.  Films of her right knee revealed no changes.  There is no evidence of loosening or fracture.  She does have active fibromyalgia.  I think it is worth trying Voltaren gel and then consider either Flector patches or possibly lidocaine patches.  She will let me know  Follow-Up Instructions: Return if symptoms worsen or fail to improve.   Orders:  No orders of the defined types were placed in this encounter.  No orders of the defined types were placed in this encounter.     Procedures: No procedures performed   Clinical Data: No additional findings.   Subjective: Chief Complaint  Patient presents with  . Follow-up    BI LAT KNEE PAIN SINCE 07/2017, NO INJURY. GIVEN CORTISONE INJECTION I R KNEE HAD SOME RELIEF FOR 2-3 THEN PAIN CAME BACK. DID MEDROL DOSE PACK WITH NO RELIEF  Aaron Edelman recently evaluated with films without any acute changes.  I reviewed them as well and did not see anything that was concerning along the medial proximal tibia.  I think this could be either Pess anserine bursa or possibly an exacerbation of her fibromyalgia with multiple trigger points that she is had at both sides  HPI  Review of Systems  Constitutional: Negative for fatigue and fever.  HENT: Negative for ear pain.   Eyes: Negative for pain.  Respiratory: Negative for cough and shortness of breath.   Cardiovascular: Positive for leg swelling.    Gastrointestinal: Negative for constipation and diarrhea.  Genitourinary: Negative for difficulty urinating.  Musculoskeletal: Positive for back pain. Negative for neck pain.  Skin: Negative for rash.  Allergic/Immunologic: Negative for food allergies.  Neurological: Positive for weakness and numbness.  Hematological: Bruises/bleeds easily.  Psychiatric/Behavioral: Positive for sleep disturbance.     Objective: Vital Signs: BP (!) 143/85 (BP Location: Left Arm, Patient Position: Sitting, Cuff Size: Normal)   Pulse 76   Ht 5\' 3"  (1.6 m)   Wt 148 lb (67.1 kg)   BMI 26.22 kg/m   Physical Exam  Constitutional: She is oriented to person, place, and time. She appears well-developed and well-nourished.  HENT:  Mouth/Throat: Oropharynx is clear and moist.  Eyes: Pupils are equal, round, and reactive to light. EOM are normal.  Pulmonary/Chest: Effort normal.  Neurological: She is alert and oriented to person, place, and time.  Skin: Skin is warm and dry.  Psychiatric: She has a normal mood and affect. Her behavior is normal.    Ortho Exam awake alert and oriented x3.  Sitting.  Right knee replacement looks just fine.  No ecchymosis or erythema.  No swelling.  No effusion.  Full extension which is a little uncomfortable along the medial proximal tibia but not over the joint line.  No instability.  No popliteal pain.  Flexion just over 100 degrees.  No distal edema.  Neurovascular exam intact.  In the left knee she had similar pain directly over the past anserine bursa any skin changes  Specialty Comments:  No specialty comments available.  Imaging: No results found.   PMFS History: Patient Active Problem List   Diagnosis Date Noted  . Primary osteoarthritis of left knee 05/30/2016  . S/P total knee replacement using cement, right 04/12/2016  . Fibromyalgia 01/22/2016  . Primary osteoarthritis of both hands 01/22/2016  . Essential hypertension 01/22/2016  . History of coronary  artery disease 01/22/2016  . History of Graves' disease 01/22/2016  . Dyslipidemia 01/22/2016  . History of thalassemia minor 01/22/2016   Past Medical History:  Diagnosis Date  . Abnormally small mouth   . Complication of anesthesia   . Coronary artery disease   . 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 Difficult intubation 07/2012  . Fibromyalgia   . High cholesterol   . History of Graves' disease   . History of migraine   . Hypertension    under control with med., has been on med. x 4 yr.  . Hypothyroidism   . Osteoarthritis   . S/P coronary artery stent placement 04/2011   states right coronary artery  . Thalassemia minor     Family History  Problem Relation Age of Onset  . Heart disease Mother   . Arthritis Mother   . Cancer Sister   . Seizures Sister   . Arthritis Brother   . Diabetes Brother     Past Surgical History:  Procedure Laterality Date  . ABDOMINAL HERNIA REPAIR  04/2012  . ABDOMINAL SURGERY  01/2011   for intestinal blockage  . APPENDECTOMY  1984  . CARDIAC CATHETERIZATION    . CHOLECYSTECTOMY  07/2012  . COLONOSCOPY    . CORONARY ANGIOPLASTY  04/2011   stent x 1  . DISTAL INTERPHALANGEAL JOINT FUSION Bilateral 12/25/2012   Procedure: DISTAL INTERPHALANGEAL JOINT FUSION RIGHT AND LEFT SMALL FINGER;  Surgeon: Cammie Sickle., MD;  Location: [facility];  Service: Orthopedics;  Laterality: Bilateral;  left and right small finger  . ESOPHAGOGASTRODUODENOSCOPY    . FINGER SURGERY    . HAND SURGERY  2014   Bilateral pinky finger distal joint fusion  . HAND SURGERY  2016   Right Thumb reconstruction and left middle finger distal joint fusion  . HAND SURGERY     Scar tissue removal right palm  . HERNIA REPAIR    . KNEE ARTHROPLASTY    . OVARIAN CYST REMOVAL    . TOTAL KNEE ARTHROPLASTY Right 03/29/2016  . TOTAL KNEE ARTHROPLASTY Right 03/29/2016   Procedure: TOTAL KNEE ARTHROPLASTY;  Surgeon: Garald Balding, MD;  Location: [facility];  Service: Orthopedics;  Laterality: Right;  . TUBAL LIGATION Bilateral 1993  . WISDOM TOOTH EXTRACTION     Social History   Occupational History  . Not on file  Tobacco Use  . Smoking status: Never Smoker  . Smokeless tobacco: Never Used  Substance and Sexual Activity  . Alcohol use: No  . Drug use: No  . Sexual activity: Not on file

## 2017-09-01 ENCOUNTER — Other Ambulatory Visit: Payer: Self-pay | Admitting: Rheumatology

## 2017-09-01 MED ORDER — TRAMADOL HCL 50 MG PO TABS: ORAL_TABLET | ORAL | 0 refills | Status: DC

## 2017-09-01 NOTE — Telephone Encounter (Signed)
ok 

## 2017-09-01 NOTE — Telephone Encounter (Signed)
Last visit: 05/25/2017 Next visit: 11/22/2017 UDS: 05/12/2017 Narc agreement: 05/12/2017  Last fill: 08/03/17  Okay to refill tramadol?

## 2017-09-01 NOTE — Telephone Encounter (Signed)
Patient request a [facility] [address] in [place].

## 2017-09-07 NOTE — Progress Notes (Deleted)
Office Visit Note  Patient: Shannon Jennings             Date of Birth: [birth year]           MRN: [id number]             PCP: Delanna Notice, MD Referring: Delanna Notice, MD Visit Date: 09/12/2017 Occupation: @GUAROCC @  Subjective:  No chief complaint on file.   History of Present Illness: Shannon Jennings is a 57 y.o. female ***   Activities of Daily Living:  Patient reports morning stiffness for *** {minute/hour:19697}.   Patient {ACTIONS;DENIES/REPORTS:21021675::"Denies"} nocturnal pain.  Difficulty dressing/grooming: {ACTIONS;DENIES/REPORTS:21021675::"Denies"} Difficulty climbing stairs: {ACTIONS;DENIES/REPORTS:21021675::"Denies"} Difficulty getting out of chair: {ACTIONS;DENIES/REPORTS:21021675::"Denies"} Difficulty using hands for taps, buttons, cutlery, and/or writing: {ACTIONS;DENIES/REPORTS:21021675::"Denies"}  No Rheumatology ROS completed.   PMFS History:  Patient Active Problem List   Diagnosis Date Noted  . Primary osteoarthritis of left knee 05/30/2016  . S/P total knee replacement using cement, right 04/12/2016  . Fibromyalgia 01/22/2016  . Primary osteoarthritis of both hands 01/22/2016  . Essential hypertension 01/22/2016  . History of coronary artery disease 01/22/2016  . History of Graves' disease 01/22/2016  . Dyslipidemia 01/22/2016  . History of thalassemia minor 01/22/2016    Past Medical History:  Diagnosis Date  . Abnormally small mouth   . Complication of anesthesia   . Coronary artery disease   . 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 Difficult intubation 07/2012  . Fibromyalgia   . High cholesterol   . History of Graves' disease   . History of migraine   . Hypertension    under control with med., has been on med. x 4 yr.  . Hypothyroidism   . Osteoarthritis   . S/P coronary artery stent placement 04/2011   states right coronary artery  . Thalassemia minor     Family History  Problem Relation Age of  Onset  . Heart disease Mother   . Arthritis Mother   . Cancer Sister   . Seizures Sister   . Arthritis Brother   . Diabetes Brother    Past Surgical History:  Procedure Laterality Date  . ABDOMINAL HERNIA REPAIR  04/2012  . ABDOMINAL SURGERY  01/2011   for intestinal blockage  . APPENDECTOMY  1984  . CARDIAC CATHETERIZATION    . CHOLECYSTECTOMY  07/2012  . COLONOSCOPY    . CORONARY ANGIOPLASTY  04/2011   stent x 1  . DISTAL INTERPHALANGEAL JOINT FUSION Bilateral 12/25/2012   Procedure: DISTAL INTERPHALANGEAL JOINT FUSION RIGHT AND LEFT SMALL FINGER;  Surgeon: Cammie Sickle., MD;  Location: [place];  Service: Orthopedics;  Laterality: Bilateral;  left and right small finger  . ESOPHAGOGASTRODUODENOSCOPY    . FINGER SURGERY    . HAND SURGERY  2014   Bilateral pinky finger distal joint fusion  . HAND SURGERY  2016   Right Thumb reconstruction and left middle finger distal joint fusion  . HAND SURGERY     Scar tissue removal right palm  . HERNIA REPAIR    . KNEE ARTHROPLASTY    . OVARIAN CYST REMOVAL    . TOTAL KNEE ARTHROPLASTY Right 03/29/2016  . TOTAL KNEE ARTHROPLASTY Right 03/29/2016   Procedure: TOTAL KNEE ARTHROPLASTY;  Surgeon: Garald Balding, MD;  Location: [place];  Service: Orthopedics;  Laterality: Right;  . TUBAL LIGATION Bilateral 1993  . [place] EXTRACTION     Social History   Social  History Narrative  . Not on file    Objective: Vital Signs: There were no vitals taken for this visit.   Physical Exam   Musculoskeletal Exam: ***  CDAI Exam: CDAI Score: Not documented Patient Global Assessment: Not documented; Provider Global Assessment: Not documented Swollen: Not documented; Tender: Not documented Joint Exam   Not documented   There is currently no information documented on the homunculus. Go to the Rheumatology activity and complete the homunculus joint exam.  Investigation: No additional findings.  Imaging: Xr Knee  3 View Right  Result Date: 08/09/2017 Three-view x-ray of the right knee reveals total knee replacement in good position alignment with excellent cement mantle.  No radiolucencies.  Do not see any osseous abnormality at the marker site.   Recent Labs: Lab Results  Component Value Date   WBC 7.7 10/05/2016   HGB 11.3 (L) 10/05/2016   PLT 262 10/05/2016   NA 141 10/05/2016   K 4.6 10/05/2016   CL 107 10/05/2016   CO2 28 10/05/2016   GLUCOSE 93 10/05/2016   BUN 12 10/05/2016   CREATININE 0.65 10/05/2016   BILITOT 0.8 10/05/2016   ALKPHOS 113 03/22/2016   AST 21 10/05/2016   ALT 29 10/05/2016   PROT 7.0 10/05/2016   ALBUMIN 4.4 03/22/2016   CALCIUM 10.0 10/05/2016   GFRAA 114 10/05/2016    Speciality Comments: No specialty comments available.  Procedures:  No procedures performed Allergies: Tizanidine; Toradol [ketorolac tromethamine]; Dilaudid [hydromorphone hcl]; Penicillins; and Sulfa antibiotics   Assessment / Plan:     Visit Diagnoses: Fibromyalgia  Primary osteoarthritis of both hands  Primary osteoarthritis of left knee  S/P total knee replacement using cement, right  History of thalassemia minor  History of Graves' disease  History of coronary artery disease  Dyslipidemia  Essential hypertension   Orders: No orders of the defined types were placed in this encounter.  No orders of the defined types were placed in this encounter.   Face-to-face time spent with patient was *** minutes. Greater than 50% of time was spent in counseling and coordination of care.  Follow-Up Instructions: No follow-ups on file.   Ofilia Neas, PA-C  Note - This record has been created using Dragon software.  Chart creation errors have been sought, but may not always  have been located. Such creation errors do not reflect on  the standard of medical care.

## 2017-09-12 ENCOUNTER — Ambulatory Visit: Payer: BLUE CROSS/BLUE SHIELD | Admitting: Physician Assistant

## 2017-09-13 ENCOUNTER — Other Ambulatory Visit: Payer: Self-pay | Admitting: Rheumatology

## 2017-09-13 NOTE — Telephone Encounter (Signed)
Last visit: 05/25/2017 Next visit: 11/22/2017  Okay to refill per Dr. Estanislado Pandy

## 2017-09-28 ENCOUNTER — Other Ambulatory Visit: Payer: Self-pay | Admitting: Rheumatology

## 2017-09-28 NOTE — Telephone Encounter (Signed)
ok 

## 2017-09-28 NOTE — Telephone Encounter (Signed)
Patient needs a refill on Tramadol sent to [facility].

## 2017-09-28 NOTE — Telephone Encounter (Signed)
Last visit: 05/25/2017 Next visit: 11/22/2017 UDS: 05/12/2017 Narc agreement: 05/12/2017  Last fill: 09/01/17  Okay to refill tramadol?

## 2017-09-29 MED ORDER — TRAMADOL HCL 50 MG PO TABS: ORAL_TABLET | ORAL | 0 refills | Status: DC

## 2017-10-03 ENCOUNTER — Telehealth (INDEPENDENT_AMBULATORY_CARE_PROVIDER_SITE_OTHER): Payer: Self-pay | Admitting: Rheumatology

## 2017-10-03 NOTE — Telephone Encounter (Signed)
Patient called stating she spoke with [facility] at [facility] who told her they have not received the prescription refill of Tramadol from Dr. Estanislado Pandy.  Patient states she will run out of medication tomorrow.

## 2017-10-03 NOTE — Telephone Encounter (Signed)
Prescription for Tramadol was faxed on 09/29/17 with the request to not fill until 10/03/17. Contacted the pharmacy to verify if they received the prescription as we did not receive a failed fax report. Larene Beach at the pharmacy states they had not received the prescription. The pharmacist was placed on the phone and a verbal authorization was given for the Tramadol.

## 2017-10-06 ENCOUNTER — Other Ambulatory Visit: Payer: Self-pay | Admitting: Physician Assistant

## 2017-10-06 NOTE — Telephone Encounter (Signed)
Last visit: 05/25/2017 Next visit: 11/22/2017  Okay to refill per Dr. Estanislado Pandy

## 2017-10-13 ENCOUNTER — Other Ambulatory Visit: Payer: Self-pay | Admitting: *Deleted

## 2017-10-13 MED ORDER — GABAPENTIN 100 MG PO CAPS: ORAL_CAPSULE | ORAL | 2 refills | Status: DC

## 2017-10-13 NOTE — Telephone Encounter (Signed)
Refill request received via fax  Last visit: 05/25/2017 Next visit: 11/22/2017  Okay to refill per Dr. Estanislado Pandy

## 2017-11-02 ENCOUNTER — Other Ambulatory Visit: Payer: Self-pay | Admitting: Rheumatology

## 2017-11-02 MED ORDER — TRAMADOL HCL 50 MG PO TABS: ORAL_TABLET | ORAL | 0 refills | Status: DC

## 2017-11-02 NOTE — Telephone Encounter (Signed)
Last visit: 05/25/2017 Next visit: 11/22/2017 UDS: 05/12/17 Narc Agreement: 05/12/17  Okay to refill Tramadol?

## 2017-11-02 NOTE — Telephone Encounter (Signed)
Patient called requesting prescription refill of Tramad[facility] [address] in [place]. °

## 2017-11-02 NOTE — Telephone Encounter (Signed)
ok.  UDS is due.

## 2017-11-02 NOTE — Telephone Encounter (Signed)
Per Dr. Estanislado Pandy okay to do UDS with appointment 11/22/17

## 2017-11-04 ENCOUNTER — Other Ambulatory Visit: Payer: Self-pay | Admitting: Rheumatology

## 2017-11-06 ENCOUNTER — Telehealth: Payer: Self-pay | Admitting: Rheumatology

## 2017-11-06 NOTE — Telephone Encounter (Signed)
Prescription was fax Friday 11/03/17 and re-faxed today.

## 2017-11-06 NOTE — Telephone Encounter (Signed)
Patient called stating she spoke with the [facility]ist at [facility] and they have not received her prescription refill of Tramadol.  Patient states she took her last 2 pills over the weekend and is requesting to pick up the prescription today if possible.

## 2017-11-08 NOTE — Progress Notes (Deleted)
Office Visit Note  Patient: Shannon Jennings             Date of Birth: Jun 11, 1960           MRN: [id number]             PCP: Delanna Notice, MD Referring: Delanna Notice, MD Visit Date: 11/22/2017 Occupation: @GUAROCC @  Subjective:  No chief complaint on file.   History of Present Illness: Shannon Jennings is a 57 y.o. female ***   Activities of Daily Living:  Patient reports morning stiffness for *** {minute/hour:19697}.   Patient {ACTIONS;DENIES/REPORTS:21021675::"Denies"} nocturnal pain.  Difficulty dressing/grooming: {ACTIONS;DENIES/REPORTS:21021675::"Denies"} Difficulty climbing stairs: {ACTIONS;DENIES/REPORTS:21021675::"Denies"} Difficulty getting out of chair: {ACTIONS;DENIES/REPORTS:21021675::"Denies"} Difficulty using hands for taps, buttons, cutlery, and/or writing: {ACTIONS;DENIES/REPORTS:21021675::"Denies"}  No Rheumatology ROS completed.   PMFS History:  Patient Active Problem List   Diagnosis Date Noted  . Primary osteoarthritis of left knee 05/30/2016  . S/P total knee replacement using cement, right 04/12/2016  . Fibromyalgia 01/22/2016  . Primary osteoarthritis of both hands 01/22/2016  . Essential hypertension 01/22/2016  . History of coronary artery disease 01/22/2016  . History of Graves' disease 01/22/2016  . Dyslipidemia 01/22/2016  . History of thalassemia minor 01/22/2016    Past Medical History:  Diagnosis Date  . Abnormally small mouth   . Complication of anesthesia   . Coronary artery disease   . 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 Difficult intubation 07/2012  . Fibromyalgia   . High cholesterol   . History of Graves' disease   . History of migraine   . Hypertension    under control with med., has been on med. x 4 yr.  . Hypothyroidism   . Osteoarthritis   . S/P coronary artery stent placement 04/2011   states right coronary artery  . Thalassemia minor     Family History  Problem Relation Age of  Onset  . Heart disease Mother   . Arthritis Mother   . Cancer Sister   . Seizures Sister   . Arthritis Brother   . Diabetes Brother    Past Surgical History:  Procedure Laterality Date  . ABDOMINAL HERNIA REPAIR  04/2012  . ABDOMINAL SURGERY  01/2011   for intestinal blockage  . APPENDECTOMY  1984  . CARDIAC CATHETERIZATION    . CHOLECYSTECTOMY  07/2012  . COLONOSCOPY    . CORONARY ANGIOPLASTY  04/2011   stent x 1  . DISTAL INTERPHALANGEAL JOINT FUSION Bilateral 12/25/2012   Procedure: DISTAL INTERPHALANGEAL JOINT FUSION RIGHT AND LEFT SMALL FINGER;  Surgeon: Cammie Sickle., MD;  Location: [place];  Service: Orthopedics;  Laterality: Bilateral;  left and right small finger  . ESOPHAGOGASTRODUODENOSCOPY    . FINGER SURGERY    . HAND SURGERY  2014   Bilateral pinky finger distal joint fusion  . HAND SURGERY  2016   Right Thumb reconstruction and left middle finger distal joint fusion  . HAND SURGERY     Scar tissue removal right palm  . HERNIA REPAIR    . KNEE ARTHROPLASTY    . OVARIAN CYST REMOVAL    . TOTAL KNEE ARTHROPLASTY Right 03/29/2016  . TOTAL KNEE ARTHROPLASTY Right 03/29/2016   Procedure: TOTAL KNEE ARTHROPLASTY;  Surgeon: Garald Balding, MD;  Location: [place];  Service: Orthopedics;  Laterality: Right;  . TUBAL LIGATION Bilateral 1993  . [place] EXTRACTION     Social History   Social  History Narrative  . Not on file    Objective: Vital Signs: There were no vitals taken for this visit.   Physical Exam   Musculoskeletal Exam: ***  CDAI Exam: CDAI Score: Not documented Patient Global Assessment: Not documented; Provider Global Assessment: Not documented Swollen: Not documented; Tender: Not documented Joint Exam   Not documented   There is currently no information documented on the homunculus. Go to the Rheumatology activity and complete the homunculus joint exam.  Investigation: No additional findings.  Imaging: No  results found.  Recent Labs: Lab Results  Component Value Date   WBC 7.7 10/05/2016   HGB 11.3 (L) 10/05/2016   PLT 262 10/05/2016   NA 141 10/05/2016   K 4.6 10/05/2016   CL 107 10/05/2016   CO2 28 10/05/2016   GLUCOSE 93 10/05/2016   BUN 12 10/05/2016   CREATININE 0.65 10/05/2016   BILITOT 0.8 10/05/2016   ALKPHOS 113 03/22/2016   AST 21 10/05/2016   ALT 29 10/05/2016   PROT 7.0 10/05/2016   ALBUMIN 4.4 03/22/2016   CALCIUM 10.0 10/05/2016   GFRAA 114 10/05/2016    Speciality Comments: No specialty comments available.  Procedures:  No procedures performed Allergies: Tizanidine; Toradol [ketorolac tromethamine]; Dilaudid [hydromorphone hcl]; Penicillins; and Sulfa antibiotics   Assessment / Plan:     Visit Diagnoses: Fibromyalgia - d/c Flexeril, switched to Zanaflex  Primary osteoarthritis of both hands  Primary osteoarthritis of left knee  S/P total knee replacement using cement, right  History of thalassemia minor  History of Graves' disease  History of coronary artery disease  Dyslipidemia   Orders: No orders of the defined types were placed in this encounter.  No orders of the defined types were placed in this encounter.   Face-to-face time spent with patient was *** minutes. Greater than 50% of time was spent in counseling and coordination of care.  Follow-Up Instructions: No follow-ups on file.   Ofilia Neas, PA-C  Note - This record has been created using Dragon software.  Chart creation errors have been sought, but may not always  have been located. Such creation errors do not reflect on  the standard of medical care.

## 2017-11-20 NOTE — Progress Notes (Deleted)
Office Visit Note  Patient: Shannon Jennings             Date of Birth: Jun 11, 1960           MRN: [id number]             PCP: Delanna Notice, MD Referring: Delanna Notice, MD Visit Date: 11/22/2017 Occupation: @GUAROCC @  Subjective:  No chief complaint on file.   History of Present Illness: Shannon Jennings is a 57 y.o. female ***   Activities of Daily Living:  Patient reports morning stiffness for *** {minute/hour:19697}.   Patient {ACTIONS;DENIES/REPORTS:21021675::"Denies"} nocturnal pain.  Difficulty dressing/grooming: {ACTIONS;DENIES/REPORTS:21021675::"Denies"} Difficulty climbing stairs: {ACTIONS;DENIES/REPORTS:21021675::"Denies"} Difficulty getting out of chair: {ACTIONS;DENIES/REPORTS:21021675::"Denies"} Difficulty using hands for taps, buttons, cutlery, and/or writing: {ACTIONS;DENIES/REPORTS:21021675::"Denies"}  No Rheumatology ROS completed.   PMFS History:  Patient Active Problem List   Diagnosis Date Noted  . Primary osteoarthritis of left knee 05/30/2016  . S/P total knee replacement using cement, right 04/12/2016  . Fibromyalgia 01/22/2016  . Primary osteoarthritis of both hands 01/22/2016  . Essential hypertension 01/22/2016  . History of coronary artery disease 01/22/2016  . History of Graves' disease 01/22/2016  . Dyslipidemia 01/22/2016  . History of thalassemia minor 01/22/2016    Past Medical History:  Diagnosis Date  . Abnormally small mouth   . Complication of anesthesia   . Coronary artery disease   . 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 Difficult intubation 07/2012  . Fibromyalgia   . High cholesterol   . History of Graves' disease   . History of migraine   . Hypertension    under control with med., has been on med. x 4 yr.  . Hypothyroidism   . Osteoarthritis   . S/P coronary artery stent placement 04/2011   states right coronary artery  . Thalassemia minor     Family History  Problem Relation Age of  Onset  . Heart disease Mother   . Arthritis Mother   . Cancer Sister   . Seizures Sister   . Arthritis Brother   . Diabetes Brother    Past Surgical History:  Procedure Laterality Date  . ABDOMINAL HERNIA REPAIR  04/2012  . ABDOMINAL SURGERY  01/2011   for intestinal blockage  . APPENDECTOMY  1984  . CARDIAC CATHETERIZATION    . CHOLECYSTECTOMY  07/2012  . COLONOSCOPY    . CORONARY ANGIOPLASTY  04/2011   stent x 1  . DISTAL INTERPHALANGEAL JOINT FUSION Bilateral 12/25/2012   Procedure: DISTAL INTERPHALANGEAL JOINT FUSION RIGHT AND LEFT SMALL FINGER;  Surgeon: Cammie Sickle., MD;  Location: [place];  Service: Orthopedics;  Laterality: Bilateral;  left and right small finger  . ESOPHAGOGASTRODUODENOSCOPY    . FINGER SURGERY    . HAND SURGERY  2014   Bilateral pinky finger distal joint fusion  . HAND SURGERY  2016   Right Thumb reconstruction and left middle finger distal joint fusion  . HAND SURGERY     Scar tissue removal right palm  . HERNIA REPAIR    . KNEE ARTHROPLASTY    . OVARIAN CYST REMOVAL    . TOTAL KNEE ARTHROPLASTY Right 03/29/2016  . TOTAL KNEE ARTHROPLASTY Right 03/29/2016   Procedure: TOTAL KNEE ARTHROPLASTY;  Surgeon: Garald Balding, MD;  Location: [place];  Service: Orthopedics;  Laterality: Right;  . TUBAL LIGATION Bilateral 1993  . [place] EXTRACTION     Social History   Social  History Narrative  . Not on file    Objective: Vital Signs: There were no vitals taken for this visit.   Physical Exam   Musculoskeletal Exam: ***  CDAI Exam: CDAI Score: Not documented Patient Global Assessment: Not documented; Provider Global Assessment: Not documented Swollen: Not documented; Tender: Not documented Joint Exam   Not documented   There is currently no information documented on the homunculus. Go to the Rheumatology activity and complete the homunculus joint exam.  Investigation: No additional findings.  Imaging: No  results found.  Recent Labs: Lab Results  Component Value Date   WBC 7.7 10/05/2016   HGB 11.3 (L) 10/05/2016   PLT 262 10/05/2016   NA 141 10/05/2016   K 4.6 10/05/2016   CL 107 10/05/2016   CO2 28 10/05/2016   GLUCOSE 93 10/05/2016   BUN 12 10/05/2016   CREATININE 0.65 10/05/2016   BILITOT 0.8 10/05/2016   ALKPHOS 113 03/22/2016   AST 21 10/05/2016   ALT 29 10/05/2016   PROT 7.0 10/05/2016   ALBUMIN 4.4 03/22/2016   CALCIUM 10.0 10/05/2016   GFRAA 114 10/05/2016    Speciality Comments: No specialty comments available.  Procedures:  No procedures performed Allergies: Tizanidine; Toradol [ketorolac tromethamine]; Dilaudid [hydromorphone hcl]; Penicillins; and Sulfa antibiotics   Assessment / Plan:     Visit Diagnoses: Fibromyalgia - tizanidine 4 mg p.o. nightly as needed.    Primary osteoarthritis of both hands - left first PIP fusion   Primary osteoarthritis of left knee  S/P total knee replacement using cement, right  History of thalassemia minor  History of Graves' disease  History of coronary artery disease  Dyslipidemia  Essential hypertension   Orders: No orders of the defined types were placed in this encounter.  No orders of the defined types were placed in this encounter.   Face-to-face time spent with patient was *** minutes. Greater than 50% of time was spent in counseling and coordination of care.  Follow-Up Instructions: No follow-ups on file.   Ofilia Neas, PA-C  Note - This record has been created using Dragon software.  Chart creation errors have been sought, but may not always  have been located. Such creation errors do not reflect on  the standard of medical care.

## 2017-11-22 ENCOUNTER — Ambulatory Visit: Payer: BLUE CROSS/BLUE SHIELD | Admitting: Physician Assistant

## 2017-11-22 NOTE — Progress Notes (Signed)
Office Visit Note  Patient: Shannon Jennings             Date of Birth: 1960-08-18           MRN: [id number]             PCP: Delanna Notice, MD Referring: Delanna Notice, MD Visit Date: 11/27/2017 Occupation: @GUAROCC @  Subjective:  Bilateral pes anserine bursitis   History of Present Illness: Shannon Jennings is a 57 y.o. female with history of fibromyalgia and osteoarthritis.  Patient reports that she has been trying to pass the left 4 mm kidney stone since October.  She states that she was hospitalized for several days and is still not passed the stone.  She states that she is currently off of Flomax and has been taking oxycodone for pain relief as needed.  She reports that she had a KUB scheduled for tomorrow.  She states that she has been following with Dr. Joya Gaskins who is her urologist. Patient continues to have generalized muscle aches and muscle tenderness due to fibromyalgia.  She has bilateral presents from bursitis.  She states that she had a right has anserine bursa injection in August 2019 that provided significant relief.  She would like injections today.  She reports she continues to have interrupted sleep at night but feels as though her fatigue has been stable.  She is no longer taking Ambien due to causing increased drowsiness worsening in the morning.  She continues to take Robaxin and Flexeril as needed for muscle spasms.  She takes Celebrex 200 mg 1 tablet by mouth as needed for pain relief.   Activities of Daily Living:  Patient reports morning stiffness for 40 minutes.   Patient Reports nocturnal pain.  Difficulty dressing/grooming: Denies Difficulty climbing stairs: Reports Difficulty getting out of chair: Denies Difficulty using hands for taps, buttons, cutlery, and/or writing: Denies  Review of Systems  Constitutional: Negative for fatigue.  HENT: Positive for mouth dryness. Negative for mouth sores and nose dryness.   Eyes: Negative for pain, visual disturbance and  dryness.  Respiratory: Negative for cough, hemoptysis, shortness of breath and difficulty breathing.   Cardiovascular: Negative for chest pain, palpitations, hypertension and swelling in legs/feet.  Gastrointestinal: Negative for blood in stool, constipation and diarrhea.  Endocrine: Negative for increased urination.  Genitourinary: Negative for painful urination.  Musculoskeletal: Positive for arthralgias, joint pain, morning stiffness and muscle tenderness. Negative for joint swelling, myalgias, muscle weakness and myalgias.  Skin: Negative for color change, pallor, rash, hair loss, nodules/bumps, skin tightness, ulcers and sensitivity to sunlight.  Allergic/Immunologic: Negative for susceptible to infections.  Neurological: Negative for dizziness, numbness, headaches and weakness.  Hematological: Negative for bruising/bleeding tendency and swollen glands.  Psychiatric/Behavioral: Positive for sleep disturbance. Negative for depressed mood. The patient is not nervous/anxious.     PMFS History:  Patient Active Problem List   Diagnosis Date Noted  . Primary osteoarthritis of left knee 05/30/2016  . S/P total knee replacement using cement, right 04/12/2016  . Fibromyalgia 01/22/2016  . Primary osteoarthritis of both hands 01/22/2016  . Essential hypertension 01/22/2016  . History of coronary artery disease 01/22/2016  . History of Graves' disease 01/22/2016  . Dyslipidemia 01/22/2016  . History of thalassemia minor 01/22/2016    Past Medical History:  Diagnosis Date  . Abnormally small mouth   . Complication of anesthesia   . Coronary artery disease   . Degenerative joint disease of hand 12/2012   right and left small  fingers  . Difficult intubation 07/2012  . Fibromyalgia   . High cholesterol   . History of Graves' disease   . History of migraine   . Hypertension    under control with med., has been on med. x 4 yr.  . Hypothyroidism   . Kidney stones   . Osteoarthritis   .  S/P coronary artery stent placement 04/2011   states right coronary artery  . Thalassemia minor     Family History  Problem Relation Age of Onset  . Heart disease Mother   . Arthritis Mother   . Cancer Sister   . Seizures Sister   . Arthritis Brother   . Diabetes Brother    Past Surgical History:  Procedure Laterality Date  . ABDOMINAL HERNIA REPAIR  04/2012  . ABDOMINAL SURGERY  01/2011   for intestinal blockage  . APPENDECTOMY  1984  . CARDIAC CATHETERIZATION    . CHOLECYSTECTOMY  07/2012  . COLONOSCOPY    . CORONARY ANGIOPLASTY  04/2011   stent x 1  . DISTAL INTERPHALANGEAL JOINT FUSION Bilateral 12/25/2012   Procedure: DISTAL INTERPHALANGEAL JOINT FUSION RIGHT AND LEFT SMALL FINGER;  Surgeon: Cammie Sickle., MD;  Location: [facility];  Service: Orthopedics;  Laterality: Bilateral;  left and right small finger  . ESOPHAGOGASTRODUODENOSCOPY    . FINGER SURGERY    . HAND SURGERY  2014   Bilateral pinky finger distal joint fusion  . HAND SURGERY  2016   Right Thumb reconstruction and left middle finger distal joint fusion  . HAND SURGERY     Scar tissue removal right palm  . HERNIA REPAIR    . KNEE ARTHROPLASTY    . OVARIAN CYST REMOVAL    . TOTAL KNEE ARTHROPLASTY Right 03/29/2016  . TOTAL KNEE ARTHROPLASTY Right 03/29/2016   Procedure: TOTAL KNEE ARTHROPLASTY;  Surgeon: Garald Balding, MD;  Location: [facility];  Service: Orthopedics;  Laterality: Right;  . TUBAL LIGATION Bilateral 1993  . WISDOM TOOTH EXTRACTION     Social History   Social History Narrative  . Not on file    Objective: Vital Signs: BP (!) 146/77 (BP Location: Left Arm, Patient Position: Sitting, Cuff Size: Normal)   Pulse 80   Resp 16   Ht 5\' 3"  (1.6 m)   Wt 156 lb 3.2 oz (70.9 kg)   BMI 27.67 kg/m    Physical Exam  Constitutional: She is oriented to person, place, and time. She appears well-developed and well-nourished.  HENT:  Head: Normocephalic and atraumatic.  Eyes:  Conjunctivae and EOM are normal.  Neck: Normal range of motion.  Cardiovascular: Normal rate, regular rhythm, normal heart sounds and intact distal pulses.  Pulmonary/Chest: Effort normal and breath sounds normal.  Abdominal: Soft. Bowel sounds are normal.  Lymphadenopathy:    She has no cervical adenopathy.  Neurological: She is alert and oriented to person, place, and time.  Skin: Skin is warm and dry. Capillary refill takes less than 2 seconds.  Psychiatric: She has a normal mood and affect. Her behavior is normal.  Nursing note and vitals reviewed.    Musculoskeletal Exam:Generalized hyperalgesia. C-spine, thoracic spine, lumbar spine good range of motion.  No midline spinal tenderness.  No SI joint tenderness.  Shoulder joints, elbow joints, wrist joints, MCPs, PIPs and DIPs good range of motion with no synovitis.  She has PIP and DIP synovial thickening consistent with also arthritis of bilateral hands.  She is complete fist formation bilaterally.  Hip joints are good range of motion no discomfort.  No tenderness of trochanter bursa bilaterally.  Left knee crepitus on exam.  No warmth or effusion noted.  Right knee replacement is doing well.  She has tenderness of bilateral pes anserine bursa.  CDAI Exam: CDAI Score: Not documented Patient Global Assessment: Not documented; Provider Global Assessment: Not documented Swollen: Not documented; Tender: Not documented Joint Exam   Not documented   There is currently no information documented on the homunculus. Go to the Rheumatology activity and complete the homunculus joint exam.  Investigation: No additional findings.  Imaging: No results found.  Recent Labs: Lab Results  Component Value Date   WBC 7.7 10/05/2016   HGB 11.3 (L) 10/05/2016   PLT 262 10/05/2016   NA 141 10/05/2016   K 4.6 10/05/2016   CL 107 10/05/2016   CO2 28 10/05/2016   GLUCOSE 93 10/05/2016   BUN 12 10/05/2016   CREATININE 0.65 10/05/2016   BILITOT  0.8 10/05/2016   ALKPHOS 113 03/22/2016   AST 21 10/05/2016   ALT 29 10/05/2016   PROT 7.0 10/05/2016   ALBUMIN 4.4 03/22/2016   CALCIUM 10.0 10/05/2016   GFRAA 114 10/05/2016    Speciality Comments: No specialty comments available.  Procedures:  Trigger Point Inj-Pes anserine bursitis cortisone injection bilaterally Date/Time: 11/27/2017 4:07 PM Performed by: Ofilia Neas, PA-C Authorized by: Ofilia Neas, PA-C   Consent Given by:  Patient Site marked: the procedure site was marked   Timeout: prior to procedure the correct patient, procedure, and site was verified   Indications:  Pain and therapeutic Total # of Trigger Points:  2 Location: lower extremity   Needle Size:  27 G Approach:  Medial (Pes anserine bursitis injections bilaterally) Medications #1:  1 mL lidocaine 1 %; 40 mg triamcinolone acetonide 40 MG/ML Medications #2:  1 mL lidocaine 1 %; 40 mg triamcinolone acetonide 40 MG/ML Patient tolerance:  Patient tolerated the procedure well with no immediate complications Comments: Assisted by [name].  Discussed proceeding with the procedure with Dr. Durward Fortes before injecting right pes anserine bursa due to hx of right total knee arthroplasty in April 2018.   Allergies: Tizanidine; Toradol [ketorolac tromethamine]; Dilaudid [hydromorphone hcl]; Penicillins; and Sulfa antibiotics   Assessment / Plan:     Visit Diagnoses: Fibromyalgia - She has generalized hyperalgesia and positive tender points.  She has bilateral pes anserine bursitis and trapezius muscle tension and tenderness.  She discontinued taking gabapentin due to experiencing weight gain.  She continues to take Robaxin 5 4 mg 1 tablet by mouth twice daily PRN for muscle spasms.  She takes Flexeril 10 mg 1 tablet by mouth at bedtime for muscle spasms.  She has been taking tramadol 2 tablets by mouth 3 times daily PRN for pain relief.  She will need a refill of tramadol on 11 3119.  She is temporarily been  taking oxycodone as needed since she has been trying to pass a left kidney stone.  We updated UDS and narcotic agreement today on 11/27/2017.  She is encouraged to stay active and exercise on a regular basis.  She will follow-up in the office in 6 months.    Primary osteoarthritis of both hands: She has PIP and DIP synovial thickening consistent with osteoarthritis of both hands.  Complete fist formation bilaterally.  No synovitis. Joint protection and muscle strengthening were discussed.   Primary osteoarthritis of left knee: No warmth or effusion of knee joints. Good ROM with  no discomfort.   S/P total knee replacement using cement, right: No warmth or effusion of knee joints.  Good ROM with no discomfort.    Medication monitoring encounter - UDS and narcotic agreement were updated today 11/27/17. She has been taking tramadol as prescribed.  She was prescribed oxycodone which she has been taking PRN while trying to pass a kidney stone. Plan: Pain Mgmt, Profile 5 w/Conf, U, Pain Mgmt, Tramadol w/medMATCH, U  Kidney stone - Seeing Dr. Joya Gaskins, off of Flomax, Taking oxycodone for pain relief, KUB tomorrow   Pes anserine bursitis - Bilateral: She has tenderness bilaterally.  She requested bilateral pes anserine bursa injections.  She tolerated the procedure well. Procedure note completed above.  I discussed proceeding with a right pes anserine bursa injection with Dr. Durward Fortes since she has a right knee arthroplasty. [name] PA-C assisted during the injections.     History of thalassemia minor  History of coronary artery disease  Essential hypertension  Dyslipidemia  History of Graves' disease    Orders: Orders Placed This Encounter  Procedures  . Large Joint Inj  . Medium Joint Inj  . Trigger Point Inj  . Pain Mgmt, Profile 5 w/Conf, U  . Pain Mgmt, Tramadol w/medMATCH, U   No orders of the defined types were placed in this encounter.   Face-to-face time spent with  patient was 30 minutes. Greater than 50% of time was spent in counseling and coordination of care.  Follow-Up Instructions: Return in about 6 months (around 05/28/2018) for Fibromyalgia.   Ofilia Neas, PA-C  Note - This record has been created using Dragon software.  Chart creation errors have been sought, but may not always  have been located. Such creation errors do not reflect on  the standard of medical care.

## 2017-11-27 ENCOUNTER — Encounter: Payer: Self-pay | Admitting: Physician Assistant

## 2017-11-27 ENCOUNTER — Other Ambulatory Visit: Payer: Self-pay

## 2017-11-27 ENCOUNTER — Ambulatory Visit: Payer: BLUE CROSS/BLUE SHIELD | Admitting: Physician Assistant

## 2017-11-27 VITALS — BP 146/77 | HR 80 | Resp 16 | Ht 63.0 in | Wt 156.2 lb

## 2017-11-27 DIAGNOSIS — M7051 Other bursitis of knee, right knee: Secondary | ICD-10-CM

## 2017-11-27 DIAGNOSIS — Z5181 Encounter for therapeutic drug level monitoring: Secondary | ICD-10-CM

## 2017-11-27 DIAGNOSIS — M7052 Other bursitis of knee, left knee: Secondary | ICD-10-CM

## 2017-11-27 DIAGNOSIS — M19042 Primary osteoarthritis, left hand: Secondary | ICD-10-CM

## 2017-11-27 DIAGNOSIS — M797 Fibromyalgia: Secondary | ICD-10-CM

## 2017-11-27 DIAGNOSIS — Z8679 Personal history of other diseases of the circulatory system: Secondary | ICD-10-CM

## 2017-11-27 DIAGNOSIS — M19041 Primary osteoarthritis, right hand: Secondary | ICD-10-CM

## 2017-11-27 DIAGNOSIS — Z862 Personal history of diseases of the blood and blood-forming organs and certain disorders involving the immune mechanism: Secondary | ICD-10-CM

## 2017-11-27 DIAGNOSIS — Z96651 Presence of right artificial knee joint: Secondary | ICD-10-CM

## 2017-11-27 DIAGNOSIS — M1712 Unilateral primary osteoarthritis, left knee: Secondary | ICD-10-CM | POA: Diagnosis not present

## 2017-11-27 DIAGNOSIS — I10 Essential (primary) hypertension: Secondary | ICD-10-CM

## 2017-11-27 DIAGNOSIS — N200 Calculus of kidney: Secondary | ICD-10-CM

## 2017-11-27 DIAGNOSIS — E785 Hyperlipidemia, unspecified: Secondary | ICD-10-CM

## 2017-11-27 DIAGNOSIS — Z8639 Personal history of other endocrine, nutritional and metabolic disease: Secondary | ICD-10-CM

## 2017-11-27 DIAGNOSIS — M7050 Other bursitis of knee, unspecified knee: Secondary | ICD-10-CM

## 2017-11-27 MED ORDER — TRIAMCINOLONE ACETONIDE 40 MG/ML IJ SUSP
40.0000 mg | INTRAMUSCULAR | Status: AC | PRN
  Administered 2017-11-27: 40 mg via INTRAMUSCULAR

## 2017-11-27 MED ORDER — LIDOCAINE HCL 1 % IJ SOLN
1.0000 mL | INTRAMUSCULAR | Status: AC | PRN
  Administered 2017-11-27: 1 mL

## 2017-11-27 NOTE — Telephone Encounter (Signed)
Patient requested refill of tramadol at office visit on 11/27/2017.  Last visit: 11/27/2017 Next visit: 05/29/2018 UDS: 11/27/2017 narc agreement: 11/27/2017  Last fill: 11/02/2017   Okay to refill tramadol?

## 2017-11-28 MED ORDER — TRAMADOL HCL 50 MG PO TABS: ORAL_TABLET | ORAL | 0 refills | Status: DC

## 2017-11-28 NOTE — Telephone Encounter (Signed)
ok 

## 2017-11-29 NOTE — Progress Notes (Signed)
UDS is consistent with treatment.

## 2017-11-30 LAB — PAIN MGMT, PROFILE 5 W/CONF, U
AMPHETAMINES: NEGATIVE ng/mL (ref <500)
BENZODIAZEPINES: NEGATIVE ng/mL (ref <100)
Barbiturates: NEGATIVE ng/mL (ref <300)
Cocaine Metabolite: NEGATIVE ng/mL (ref <150)
Creatinine: 41.8 mg/dL
MARIJUANA METABOLITE: NEGATIVE ng/mL (ref <20)
METHADONE METABOLITE: NEGATIVE ng/mL (ref <100)
Noroxycodone: 756 ng/mL — ABNORMAL HIGH (ref <50)
OPIATES: NEGATIVE ng/mL (ref <100)
OXIDANT: NEGATIVE ug/mL (ref <200)
OXYCODONE: POSITIVE ng/mL — AB (ref <100)
Oxycodone: 434 ng/mL — ABNORMAL HIGH (ref <50)
Oxymorphone: 147 ng/mL — ABNORMAL HIGH (ref <50)
pH: 6.61 (ref 4.5–9.0)

## 2017-11-30 LAB — PAIN MGMT, TRAMADOL W/MEDMATCH, U
Desmethyltramadol: 5574 ng/mL — ABNORMAL HIGH (ref <100)
Tramadol: >10000 ng/mL — ABNORMAL HIGH (ref <100)

## 2017-12-04 ENCOUNTER — Telehealth: Payer: Self-pay | Admitting: Rheumatology

## 2017-12-04 NOTE — Telephone Encounter (Signed)
Patient called requesting prescription refill of Tr[facility]ad[facility] [address] in [place].

## 2017-12-04 NOTE — Telephone Encounter (Signed)
[facility] called stating [facility] able to [facility] up her prescription of Tramadol until 12/09/17.  Patient states her prescription was last filled on 11/06/17 and is out of medication.  Patient requested a return call.

## 2017-12-04 NOTE — Progress Notes (Signed)
She has a kidney stone, and she was started on Percocet PRN for pain relief acutely by urologist.

## 2017-12-04 NOTE — Telephone Encounter (Signed)
Patient advised that our office did not indicate that she could not pick up the medication until 12/09/17. Patient advised that it may be an insurance indication. Patient states she will contact the pharmacy.

## 2017-12-04 NOTE — Telephone Encounter (Signed)
Left message to advise patient her prescription for tramadol was sent to the pharmacy on 11/28/17.

## 2018-01-02 ENCOUNTER — Telehealth: Payer: Self-pay | Admitting: Rheumatology

## 2018-01-02 MED ORDER — TRAMADOL HCL 50 MG PO TABS: ORAL_TABLET | ORAL | 0 refills | Status: DC

## 2018-01-02 NOTE — Telephone Encounter (Signed)
Patient called requesting prescription refill of Tr[facility]ad[facility] [address] in [place].

## 2018-01-02 NOTE — Telephone Encounter (Signed)
Last Visit: 11/27/17 Next Visit: 05/29/18 UDS: 11/27/17  Narc Agreement: 11/27/17  Okay to refill tramadol?

## 2018-01-14 ENCOUNTER — Other Ambulatory Visit: Payer: Self-pay | Admitting: Rheumatology

## 2018-01-15 NOTE — Telephone Encounter (Signed)
Last Visit: 11/27/17 Next Visit: 05/29/18  Okay to refill per Dr. Estanislado Pandy

## 2018-01-17 ENCOUNTER — Telehealth: Payer: Self-pay | Admitting: Rheumatology

## 2018-01-17 MED ORDER — ZOLPIDEM TARTRATE 5 MG PO TABS: 5.0000 mg | ORAL_TABLET | Freq: Every evening | ORAL | 0 refills | Status: DC | PRN

## 2018-01-17 NOTE — Telephone Encounter (Signed)
Please call in Ambien 5 mg p.o. nightly as needed total 30 with no refills.

## 2018-01-17 NOTE — Telephone Encounter (Signed)
Patient called stating she would like to restart her prescription of Ambien.  Patient states she is only getting 3-4 hours of sleep per night.  [facility] is [facility] [address] in [place].

## 2018-01-17 NOTE — Telephone Encounter (Signed)
Patient called stating she would like to restart her prescription of Ambien.  Patient states she is only getting 3-4 hours of sleep per night. We last prescribed Ambien 10/2016. Patient was last seen in our office 11/27/17 and due to follow up 05/29/18. Please advise

## 2018-01-21 ENCOUNTER — Other Ambulatory Visit: Payer: Self-pay | Admitting: Rheumatology

## 2018-01-22 NOTE — Telephone Encounter (Addendum)
Patient states she is no longer taking this medication

## 2018-01-31 ENCOUNTER — Telehealth: Payer: Self-pay | Admitting: Rheumatology

## 2018-01-31 MED ORDER — TRAMADOL HCL 50 MG PO TABS: ORAL_TABLET | ORAL | 0 refills | Status: DC

## 2018-01-31 NOTE — Telephone Encounter (Signed)
Patient called requesting prescription refill of Tramad[facility] [address] in [place], [place].

## 2018-01-31 NOTE — Telephone Encounter (Signed)
Last Visit: 11/27/17 Next Visit: 05/29/18 UDS: 11/27/17  Narc Agreement: 11/27/17  Okay to refill tramadol?

## 2018-02-27 ENCOUNTER — Telehealth: Payer: Self-pay | Admitting: *Deleted

## 2018-02-27 NOTE — Telephone Encounter (Signed)
Received report [employer]. Patient has breeched narcotic agreement as she has received multiple prescriptions of Oxycodone. Patient only reported the prescription form 11/02/17. Patient has been under a narcotic agreement with Dr. Estanislado Pandy for Tramadol. Patient advised per Dr. Estanislado Pandy we would no longer be able to prescribe Tramadol. Patient is extremely upset. Patient states she has not breeched the narcotic agreement as she has let us know that she was given a narcotic prescription. Patient was advised that she only let us know about the one prescription and not the other 3 prescriptions. Patient states she has never been treated this way and demands a call back from Dr. Estanislado Pandy as soon as possible and if that does not happen she will file a complaint.

## 2018-02-28 NOTE — Telephone Encounter (Signed)
LMAM for patient to call back.

## 2018-03-01 NOTE — Telephone Encounter (Signed)
I returned patient's call today.  I explained to her that we received notice from her insurance about prescription refills on Percocet.  I also explained that I would not be able to refill the tramadol in future as she is receiving pain management from other provider.  She mentioned that she would like to get off tramadol eventually.  I have advised her to taper off tramadol gradually.  She is also interested in trying anti-inflammatories for pain management.  She does have a prescription for Celebrex which she will try for now.  I advised her to get her CBC and CMP monitored at least every 6 months if she takes Celebrex on routine basis.  Side effects of long-term use of anti-inflammatories were also discussed. Bo Merino, MD

## 2018-03-02 ENCOUNTER — Telehealth: Payer: Self-pay | Admitting: Rheumatology

## 2018-03-02 MED ORDER — GABAPENTIN 100 MG PO CAPS: 200.0000 mg | ORAL_CAPSULE | Freq: Every day | ORAL | 2 refills | Status: DC

## 2018-03-02 NOTE — Telephone Encounter (Signed)
Last Visit: 11/27/17 Next Visit: 05/29/18  Okay to refill per Dr. Estanislado Pandy

## 2018-03-02 NOTE — Telephone Encounter (Signed)
Patient called stating she had labs in January at [facility] in [place].  Patient states she was told that Dr. Estanislado Pandy needed to call # [phone] and request results.

## 2018-03-02 NOTE — Telephone Encounter (Signed)
Patient called stating since she is coming off the Tramadol she is requesting prescription refill of Gabapentin to take at night.  [facility] [address] in [place], [place].

## 2018-03-05 ENCOUNTER — Encounter (INDEPENDENT_AMBULATORY_CARE_PROVIDER_SITE_OTHER): Payer: Self-pay

## 2018-03-05 ENCOUNTER — Ambulatory Visit (INDEPENDENT_AMBULATORY_CARE_PROVIDER_SITE_OTHER): Payer: BLUE CROSS/BLUE SHIELD | Admitting: Orthopaedic Surgery

## 2018-03-05 ENCOUNTER — Encounter (INDEPENDENT_AMBULATORY_CARE_PROVIDER_SITE_OTHER): Payer: Self-pay | Admitting: Orthopaedic Surgery

## 2018-03-05 VITALS — BP 138/79 | HR 70 | Ht 63.0 in | Wt 156.0 lb

## 2018-03-05 DIAGNOSIS — M1712 Unilateral primary osteoarthritis, left knee: Secondary | ICD-10-CM | POA: Diagnosis not present

## 2018-03-05 MED ORDER — BUPIVACAINE HCL 0.5 % IJ SOLN
2.0000 mL | INTRAMUSCULAR | Status: AC | PRN
  Administered 2018-03-05: 2 mL via INTRA_ARTICULAR

## 2018-03-05 MED ORDER — LIDOCAINE HCL 1 % IJ SOLN
2.0000 mL | INTRAMUSCULAR | Status: AC | PRN
  Administered 2018-03-05: 2 mL

## 2018-03-05 MED ORDER — METHYLPREDNISOLONE ACETATE 40 MG/ML IJ SUSP
80.0000 mg | INTRAMUSCULAR | Status: AC | PRN
  Administered 2018-03-05: 80 mg via INTRA_ARTICULAR

## 2018-03-05 NOTE — Progress Notes (Signed)
Office Visit Note   Patient: Shannon Jennings           Date of Birth: 1960-08-01           MRN: [id number] Visit Date: 03/05/2018              Requested by: Delanna Notice, [place] [address] PCP: Delanna Notice, MD   Assessment & Plan: Visit Diagnoses:  1. Primary osteoarthritis of left knee     Plan: Cortisone injection left knee.  Encourage exercises including swimming working with machines and exercise bicycle and return as needed  Follow-Up Instructions: Return if symptoms worsen or fail to improve.   Orders:  Orders Placed This Encounter  Procedures  . Large Joint Inj: L knee   No orders of the defined types were placed in this encounter.     Procedures: Large Joint Inj: L knee on 03/05/2018 10:38 AM Indications: pain and diagnostic evaluation Details: 25 G 1.5 in needle, anteromedial approach  Arthrogram: No  Medications: 2 mL lidocaine 1 %; 2 mL bupivacaine 0.5 %; 80 mg methylPREDNISolone acetate 40 MG/ML Procedure, treatment alternatives, risks and benefits explained, specific risks discussed. Consent was given by the patient. Patient was prepped and draped in the usual sterile fashion.       Clinical Data: No additional findings.   Subjective: Chief Complaint  Patient presents with  . Right Knee - Pain  . Left Knee - Pain  Patient presents today with bilateral knee pain. She said that the left is worse than the right. The pain is located medially. They have both flared up in the last 2-3 weeks. She is taking gabapentin, tylenol, and tramadol as needed. She has a history of right knee arthroplasty. Both knees were injected with cortisone last year Doing well in terms of her right knee now 2 years status post arthroplasty.  Having recurrent symptoms in the medial compartment of her left knee.  No injury or trauma.  Working long hours and has not had a chance to exercise. Films in August 2019 demonstrate excellent position of the right total  knee replacement.  Has advanced arthritis on the left  HPI  Review of Systems   Objective: Vital Signs: BP 138/79   Pulse 70   Ht 5\' 3"  (1.6 m)   Wt 156 lb (70.8 kg)   BMI 27.63 kg/m   Physical Exam Constitutional:      Appearance: She is well-developed.  Eyes:     Pupils: Pupils are equal, round, and reactive to light.  Pulmonary:     Effort: Pulmonary effort is normal.  Skin:    General: Skin is warm and dry.  Neurological:     Mental Status: She is alert and oriented to person, place, and time.  Psychiatric:        Behavior: Behavior normal.     Ortho Exam right knee was not hot red warm swollen.  No effusion.  No opening with varus valgus stress.  Several millimeter anterior drawer sign.  Full extension and flexion over 105 degrees.  No calf pain.  No popliteal fullness.  Knee was not hot warm or red.  Left knee with predominant pain along the medial compartment and just in the proximal tibia.  No effusion.  Some patellar crepitation.  Full extension and over 105 degrees of flexion  Specialty Comments:  No specialty comments available.  Imaging: No results found.   PMFS History: Patient Active Problem List  Diagnosis Date Noted  . Primary osteoarthritis of left knee 05/30/2016  . S/P total knee replacement using cement, right 04/12/2016  . Fibromyalgia 01/22/2016  . Primary osteoarthritis of both hands 01/22/2016  . Essential hypertension 01/22/2016  . History of coronary artery disease 01/22/2016  . History of Graves' disease 01/22/2016  . Dyslipidemia 01/22/2016  . History of thalassemia minor 01/22/2016   Past Medical History:  Diagnosis Date  . Abnormally small mouth   . Complication of anesthesia   . Coronary artery disease   . 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 Difficult intubation 07/2012  . Fibromyalgia   . High cholesterol   . History of Graves' disease   . History of migraine   . Hypertension      under control with med., has been on med. x 4 yr.  . Hypothyroidism   . Kidney stones   . Osteoarthritis   . S/P coronary artery stent placement 04/2011   states right coronary artery  . Thalassemia minor     Family History  Problem Relation Age of Onset  . Heart disease Mother   . Arthritis Mother   . Cancer Sister   . Seizures Sister   . Arthritis Brother   . Diabetes Brother     Past Surgical History:  Procedure Laterality Date  . ABDOMINAL HERNIA REPAIR  04/2012  . ABDOMINAL SURGERY  01/2011   for intestinal blockage  . APPENDECTOMY  1984  . CARDIAC CATHETERIZATION    . CHOLECYSTECTOMY  07/2012  . COLONOSCOPY    . CORONARY ANGIOPLASTY  04/2011   stent x 1  . DISTAL INTERPHALANGEAL JOINT FUSION Bilateral 12/25/2012   Procedure: DISTAL INTERPHALANGEAL JOINT FUSION RIGHT AND LEFT SMALL FINGER;  Surgeon: Cammie Sickle., MD;  Location: [facility];  Service: Orthopedics;  Laterality: Bilateral;  left and right small finger  . ESOPHAGOGASTRODUODENOSCOPY    . FINGER SURGERY    . HAND SURGERY  2014   Bilateral pinky finger distal joint fusion  . HAND SURGERY  2016   Right Thumb reconstruction and left middle finger distal joint fusion  . HAND SURGERY     Scar tissue removal right palm  . HERNIA REPAIR    . KNEE ARTHROPLASTY    . OVARIAN CYST REMOVAL    . TOTAL KNEE ARTHROPLASTY Right 03/29/2016  . TOTAL KNEE ARTHROPLASTY Right 03/29/2016   Procedure: TOTAL KNEE ARTHROPLASTY;  Surgeon: Garald Balding, MD;  Location: [facility];  Service: Orthopedics;  Laterality: Right;  . TUBAL LIGATION Bilateral 1993  . WISDOM TOOTH EXTRACTION     Social History   Occupational History  . Not on file  Tobacco Use  . Smoking status: Never Smoker  . Smokeless tobacco: Never Used  Substance and Sexual Activity  . Alcohol use: No  . Drug use: No  . Sexual activity: Not on file

## 2018-03-05 NOTE — Telephone Encounter (Signed)
Contacted lab care and labs to be faxed to office from 02/02/2018.

## 2018-04-04 ENCOUNTER — Other Ambulatory Visit: Payer: Self-pay | Admitting: Rheumatology

## 2018-04-04 NOTE — Telephone Encounter (Signed)
Last Visit: 11/27/2017 Next Visit: 05/29/2018  Okay to refill per Dr. Estanislado Pandy.

## 2018-05-07 ENCOUNTER — Other Ambulatory Visit: Payer: Self-pay | Admitting: Rheumatology

## 2018-05-07 NOTE — Telephone Encounter (Signed)
Patient request a refill on Robaxin, and [facility] [place] [facility] [address]. Patient aware doctor will be back in office Tuesday.

## 2018-05-07 NOTE — Telephone Encounter (Addendum)
Last Visit: 11/27/2017 Next Visit: 05/29/2018  Robaxin has not be filled since 2018. Patient states she has been taking this during the day since she has not been on tramadol. She takes the flexeril at bedtime.   Last fill of ambien: 01/17/2018  Okay to refill [place] and robaxin?

## 2018-05-08 MED ORDER — ZOLPIDEM TARTRATE 5 MG PO TABS: 5.0000 mg | ORAL_TABLET | Freq: Every evening | ORAL | 0 refills | Status: DC | PRN

## 2018-05-08 MED ORDER — METHOCARBAMOL 500 MG PO TABS: 500.0000 mg | ORAL_TABLET | Freq: Two times a day (BID) | ORAL | 0 refills | Status: DC | PRN

## 2018-05-08 NOTE — Telephone Encounter (Signed)
ok 

## 2018-05-17 NOTE — Progress Notes (Signed)
Office Visit Note  Patient: Shannon Jennings             Date of Birth: [birth year]           MRN: [id number]             PCP: Delanna Notice, MD Referring: Delanna Notice, MD Visit Date: 05/29/2018 Occupation: @GUAROCC @  Subjective:  Left pes anserine bursitis   History of Present Illness: Shannon Jennings is a 58 y.o. female with history of fibromyalgia and osteoarthritis.  She takes Gabapentin 100 mg 2 capsules at bedtime, Robaxin 500 mg BID PRN, and flexeril 5 mg po at bedtime for muscle spasms.  She takes Ambien 5 mg po at bedtime for insomnia.  She takes Celebrex 200 mg 1 capsule at daily for pain relief.  She has noticed increased pain since coming off of tramadol.  She states she continues to have generalized muscle aches and muscle tenderness due to fibromyalgia.  She experiences increased pain in bilateral lower extremities especially at night.  She states the gabapentin has started to help with her pain but she is noticed some weight gain.  She states she is been unable to swim for exercise but she previously was swimming 3-4 times per week but the gym is closed.  She has intermittent left pedis anserine bursitis.  She is tried using Voltaren gel topically in the past but did not notice any relief.   Activities of Daily Living:  Patient reports morning stiffness for 30-45 minutes.   Patient Reports nocturnal pain.  Difficulty dressing/grooming: Denies Difficulty climbing stairs: Reports Difficulty getting out of chair: Denies Difficulty using hands for taps, buttons, cutlery, and/or writing: Reports  Review of Systems  Constitutional: Positive for fatigue.  HENT: Negative for mouth sores, mouth dryness and nose dryness.   Eyes: Negative for pain, visual disturbance and dryness.  Respiratory: Negative for cough, hemoptysis, shortness of breath and difficulty breathing.   Cardiovascular: Negative for chest pain, palpitations, hypertension and swelling in legs/feet.  Gastrointestinal:  Negative for blood in stool, constipation and diarrhea.  Endocrine: Negative for increased urination.  Genitourinary: Negative for painful urination.  Musculoskeletal: Positive for arthralgias, joint pain, myalgias, morning stiffness, muscle tenderness and myalgias. Negative for joint swelling and muscle weakness.  Skin: Negative for color change, pallor, rash, hair loss, nodules/bumps, skin tightness, ulcers and sensitivity to sunlight.  Allergic/Immunologic: Negative for susceptible to infections.  Neurological: Negative for dizziness, numbness, headaches and weakness.  Hematological: Negative for swollen glands.  Psychiatric/Behavioral: Positive for sleep disturbance. Negative for depressed mood. The patient is not nervous/anxious.     PMFS History:  Patient Active Problem List   Diagnosis Date Noted  . Primary osteoarthritis of left knee 05/30/2016  . S/P total knee replacement using cement, right 04/12/2016  . Fibromyalgia 01/22/2016  . Primary osteoarthritis of both hands 01/22/2016  . Essential hypertension 01/22/2016  . History of coronary artery disease 01/22/2016  . History of Graves' disease 01/22/2016  . Dyslipidemia 01/22/2016  . History of thalassemia minor 01/22/2016    Past Medical History:  Diagnosis Date  . Abnormally small mouth   . Complication of anesthesia   . Coronary artery disease   . 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 Difficult intubation 07/2012  . Fibromyalgia   . High cholesterol   . History of Graves' disease   . History of migraine   . Hypertension    under control with med.,  has been on med. x 4 yr.  . Hypothyroidism   . Kidney stones   . Osteoarthritis   . S/P coronary artery stent placement 04/2011   states right coronary artery  . Thalassemia minor     Family History  Problem Relation Age of Onset  . Heart disease Mother   . Arthritis Mother   . Cancer Sister   . Seizures Sister   . Arthritis  Brother   . Diabetes Brother    Past Surgical History:  Procedure Laterality Date  . ABDOMINAL HERNIA REPAIR  04/2012  . ABDOMINAL SURGERY  01/2011   for intestinal blockage  . APPENDECTOMY  1984  . CARDIAC CATHETERIZATION    . CHOLECYSTECTOMY  07/2012  . COLONOSCOPY    . CORONARY ANGIOPLASTY  04/2011   stent x 1  . DISTAL INTERPHALANGEAL JOINT FUSION Bilateral 12/25/2012   Procedure: DISTAL INTERPHALANGEAL JOINT FUSION RIGHT AND LEFT SMALL FINGER;  Surgeon: Cammie Sickle., MD;  Location: [facility];  Service: Orthopedics;  Laterality: Bilateral;  left and right small finger  . ESOPHAGOGASTRODUODENOSCOPY    . FINGER SURGERY    . HAND SURGERY  2014   Bilateral pinky finger distal joint fusion  . HAND SURGERY  2016   Right Thumb reconstruction and left middle finger distal joint fusion  . HAND SURGERY     Scar tissue removal right palm  . HERNIA REPAIR    . KNEE ARTHROPLASTY    . OVARIAN CYST REMOVAL    . TOTAL KNEE ARTHROPLASTY Right 03/29/2016  . TOTAL KNEE ARTHROPLASTY Right 03/29/2016   Procedure: TOTAL KNEE ARTHROPLASTY;  Surgeon: Garald Balding, MD;  Location: [facility];  Service: Orthopedics;  Laterality: Right;  . TUBAL LIGATION Bilateral 1993  . WISDOM TOOTH EXTRACTION     Social History   Social History Narrative  . Not on file    There is no immunization history on file for this patient.   Objective: Vital Signs: BP 123/82 (BP Location: Left Arm, Patient Position: Sitting, Cuff Size: Small)   Pulse 83   Resp 12   Ht 5\' 3"  (1.6 m)   Wt 168 lb 3.2 oz (76.3 kg)   BMI 29.80 kg/m    Physical Exam Vitals signs and nursing note reviewed.  Constitutional:      Appearance: She is well-developed.  HENT:     Head: Normocephalic and atraumatic.  Eyes:     Conjunctiva/sclera: Conjunctivae normal.  Neck:     Musculoskeletal: Normal range of motion.  Cardiovascular:     Rate and Rhythm: Normal rate and regular rhythm.     Heart sounds: Normal  heart sounds.  Pulmonary:     Effort: Pulmonary effort is normal.     Breath sounds: Normal breath sounds.  Abdominal:     General: Bowel sounds are normal.     Palpations: Abdomen is soft.  Lymphadenopathy:     Cervical: No cervical adenopathy.  Skin:    General: Skin is warm and dry.     Capillary Refill: Capillary refill takes less than 2 seconds.  Neurological:     Mental Status: She is alert and oriented to person, place, and time.  Psychiatric:        Behavior: Behavior normal.      Musculoskeletal Exam: C-spine, thoracic spine, lumbar spine good range of motion.  No midline spinal tenderness.  No SI joint tenderness.  Shoulder joints, elbow joints, wrist joints good ROM with no synovitis.  DIP synovial thickening noted.  Hip joints, knee joints, ankle joints, MTPs, PIPs, DIPs good range of motion no synovitis.  No warmth or effusion bilateral knee joints.  No tenderness or swelling of ankle joints.  No tenderness over trochanter bursa bilaterally.  CDAI Exam: CDAI Score: Not documented Patient Global Assessment: Not documented; Provider Global Assessment: Not documented Swollen: Not documented; Tender: Not documented Joint Exam   Not documented   There is currently no information documented on the homunculus. Go to the Rheumatology activity and complete the homunculus joint exam.  Investigation: No additional findings.  Imaging: No results found.  Recent Labs: Lab Results  Component Value Date   WBC 7.7 10/05/2016   HGB 11.3 (L) 10/05/2016   PLT 262 10/05/2016   NA 141 10/05/2016   K 4.6 10/05/2016   CL 107 10/05/2016   CO2 28 10/05/2016   GLUCOSE 93 10/05/2016   BUN 12 10/05/2016   CREATININE 0.65 10/05/2016   BILITOT 0.8 10/05/2016   ALKPHOS 113 03/22/2016   AST 21 10/05/2016   ALT 29 10/05/2016   PROT 7.0 10/05/2016   ALBUMIN 4.4 03/22/2016   CALCIUM 10.0 10/05/2016   GFRAA 114 10/05/2016    Speciality Comments: No specialty comments available.   Procedures:  No procedures performed Allergies: Tizanidine; Toradol [ketorolac tromethamine]; Dilaudid [hydromorphone hcl]; Penicillins; and Sulfa antibiotics   Assessment / Plan:     Visit Diagnoses: Fibromyalgia: She has generalized muscle aches and muscle tenderness due to fibromyalgia.  She has been having increased neuralgias and myalgias in bilateral lower extremities at night.  She continues to take Celebrex 200 mg 1 capsule po daily for pain relief.  She has noticed increased pain since discontinuing tramadol.  She has noticed some improvement in neuralgias while on Gabapentin.  She continues to take Robaxin 500 mg BID prn and flexeril 5 mg po at bedtime for muscle spasms.  She does not need any refills at this time. Prior to the COVID-19 pandemic she was swimming 3-4 times per week but she has been unable to since the gym closed.  She was encouraged to stay active and exercise.  She will follow up in 6 months.   Primary osteoarthritis of both hands: She has chronic pain in both hands.  She sees Dr. Burney Gauze on a regular basis.  She has DIP synovial thickening but no synovitis.   Joint protection and muscle strengthening were discussed.     Primary osteoarthritis of left knee: No warmth or effusion. She has good ROM with some discomfort.  She has chronic left knee joint pain, but she is not ready to proceed with a knee replacement yet.  She has pes anserine bursitis.  She was encouraged to use voltaren gel topically for pain relief.   S/P total knee replacement using cement, right: Doing well.  She has good ROM with no discomfort.    Other medical conditions are listed as follows:   History of thalassemia minor  History of coronary artery disease  Essential hypertension  Dyslipidemia  History of Graves' disease  Kidney stone   Orders: No orders of the defined types were placed in this encounter.  No orders of the defined types were placed in this encounter.     Follow-Up  Instructions: Return in about 6 months (around 11/29/2018) for Fibromyalgia, Osteoarthritis.   Ofilia Neas, PA-C  Note - This record has been created using Dragon software.  Chart creation errors have been sought, but may not always  have  been located. Such creation errors do not reflect on  the standard of medical care.

## 2018-05-29 ENCOUNTER — Other Ambulatory Visit: Payer: Self-pay

## 2018-05-29 ENCOUNTER — Ambulatory Visit: Payer: BLUE CROSS/BLUE SHIELD | Admitting: Physician Assistant

## 2018-05-29 ENCOUNTER — Encounter: Payer: Self-pay | Admitting: Physician Assistant

## 2018-05-29 VITALS — BP 123/82 | HR 83 | Resp 12 | Ht 63.0 in | Wt 168.2 lb

## 2018-05-29 DIAGNOSIS — M19042 Primary osteoarthritis, left hand: Secondary | ICD-10-CM

## 2018-05-29 DIAGNOSIS — M797 Fibromyalgia: Secondary | ICD-10-CM | POA: Diagnosis not present

## 2018-05-29 DIAGNOSIS — M19041 Primary osteoarthritis, right hand: Secondary | ICD-10-CM | POA: Diagnosis not present

## 2018-05-29 DIAGNOSIS — Z96651 Presence of right artificial knee joint: Secondary | ICD-10-CM

## 2018-05-29 DIAGNOSIS — Z862 Personal history of diseases of the blood and blood-forming organs and certain disorders involving the immune mechanism: Secondary | ICD-10-CM

## 2018-05-29 DIAGNOSIS — I10 Essential (primary) hypertension: Secondary | ICD-10-CM

## 2018-05-29 DIAGNOSIS — Z8679 Personal history of other diseases of the circulatory system: Secondary | ICD-10-CM

## 2018-05-29 DIAGNOSIS — N200 Calculus of kidney: Secondary | ICD-10-CM

## 2018-05-29 DIAGNOSIS — E785 Hyperlipidemia, unspecified: Secondary | ICD-10-CM

## 2018-05-29 DIAGNOSIS — Z8639 Personal history of other endocrine, nutritional and metabolic disease: Secondary | ICD-10-CM

## 2018-05-29 DIAGNOSIS — M1712 Unilateral primary osteoarthritis, left knee: Secondary | ICD-10-CM | POA: Diagnosis not present

## 2018-05-30 ENCOUNTER — Other Ambulatory Visit: Payer: Self-pay | Admitting: Rheumatology

## 2018-05-30 NOTE — Telephone Encounter (Signed)
Last Visit: 05/29/18 Next visit: 12/04/18  Okay to refill per Dr. Estanislado Pandy

## 2018-07-01 ENCOUNTER — Other Ambulatory Visit: Payer: Self-pay | Admitting: Rheumatology

## 2018-07-02 NOTE — Telephone Encounter (Signed)
Last Visit: 05/29/18 Next visit: 12/04/18  Okay to refill per Dr. Estanislado Pandy

## 2018-07-12 ENCOUNTER — Other Ambulatory Visit: Payer: Self-pay

## 2018-07-12 ENCOUNTER — Ambulatory Visit: Payer: BLUE CROSS/BLUE SHIELD | Admitting: Rheumatology

## 2018-07-12 VITALS — BP 118/76 | HR 83

## 2018-07-12 DIAGNOSIS — M7051 Other bursitis of knee, right knee: Secondary | ICD-10-CM | POA: Diagnosis not present

## 2018-07-12 DIAGNOSIS — M7052 Other bursitis of knee, left knee: Secondary | ICD-10-CM

## 2018-07-12 DIAGNOSIS — M7050 Other bursitis of knee, unspecified knee: Secondary | ICD-10-CM

## 2018-07-12 DIAGNOSIS — Z96651 Presence of right artificial knee joint: Secondary | ICD-10-CM

## 2018-07-12 DIAGNOSIS — M1712 Unilateral primary osteoarthritis, left knee: Secondary | ICD-10-CM

## 2018-07-12 MED ORDER — LIDOCAINE HCL 1 % IJ SOLN
1.0000 mL | INTRAMUSCULAR | Status: AC | PRN
  Administered 2018-07-12: 1 mL

## 2018-07-12 MED ORDER — TRIAMCINOLONE ACETONIDE 40 MG/ML IJ SUSP
30.0000 mg | INTRAMUSCULAR | Status: AC | PRN
  Administered 2018-07-12: 30 mg via INTRA_ARTICULAR

## 2018-07-12 NOTE — Progress Notes (Signed)
   Procedure Note  Patient: Shannon Jennings             Date of Birth: June 15, 1960           MRN: [id number]             Visit Date: 07/12/2018  Procedures: Visit Diagnoses:  1. Pes anserine bursitis   2. Primary osteoarthritis of left knee   3. S/P total knee replacement using cement, right    Bilateral pes anserine bursa cortisone injections   Large Joint Inj: bilateral knee (Bilateral pes anserine bursa ) on 07/12/2018 1:26 PM Indications: pain Details: 27 G 1.5 in needle, medial approach  Arthrogram: No  Medications (Right): 1 mL lidocaine 1 %; 30 mg triamcinolone acetonide 40 MG/ML Aspirate (Right): 0 mL Medications (Left): 1 mL lidocaine 1 %; 30 mg triamcinolone acetonide 40 MG/ML Aspirate (Left): 0 mL Outcome: tolerated well, no immediate complications Procedure, treatment alternatives, risks and benefits explained, specific risks discussed. Consent was given by the patient. Immediately prior to procedure a time out was called to verify the correct patient, procedure, equipment, support staff and site/side marked as required. Patient was prepped and draped in the usual sterile fashion.     Bo Merino, MD

## 2018-08-10 ENCOUNTER — Telehealth: Payer: Self-pay | Admitting: Rheumatology

## 2018-08-10 NOTE — Telephone Encounter (Addendum)
Last Visit: 05/29/18 Next visit: 12/04/18

## 2018-08-10 NOTE — Telephone Encounter (Signed)
Patient called requesting prescription refill of Celebrex to be [facility] to [facility] in [place].

## 2018-08-10 NOTE — Telephone Encounter (Signed)
Advised patient that updated labs are needed in order to refill celebrex, patient verbalized understanding. Patient states she will upload results to [facility] or fax them on Monday when she goes into work. Patient is aware medication will not be refilled until labs are received/reviewed.

## 2018-09-28 ENCOUNTER — Other Ambulatory Visit: Payer: Self-pay | Admitting: Rheumatology

## 2018-09-28 NOTE — Telephone Encounter (Signed)
Last Visit: 05/29/18 Next visit: 12/04/18  Okay to refill per Dr. Deveshwar  

## 2018-10-16 IMAGING — CR DG CHEST 2V
2 series · 2 of 2 positions shown · non-contrast
Comparison: None.

CLINICAL DATA: Preop knee replacement history of hypertension and
coronary artery disease

EXAM:
CHEST  2 VIEW

[w chest pa]
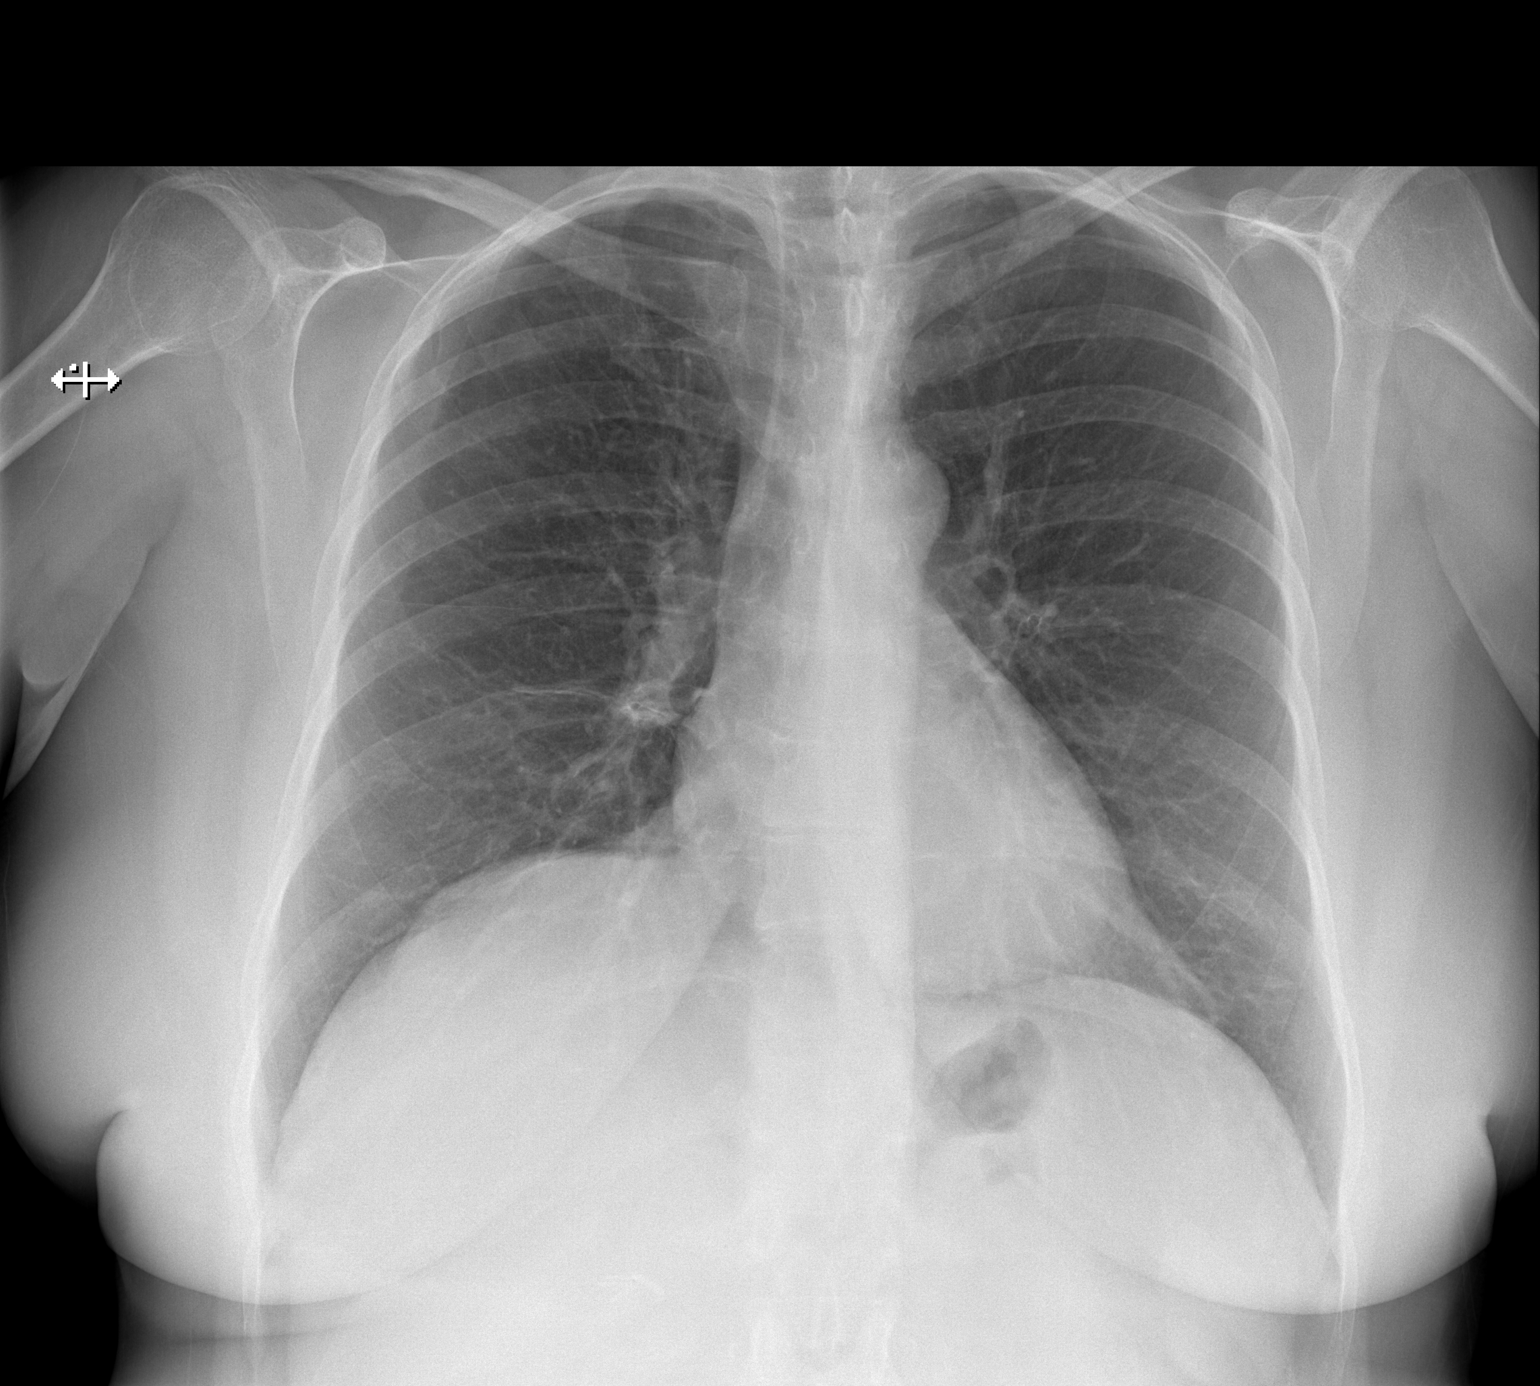

[w chest lat]
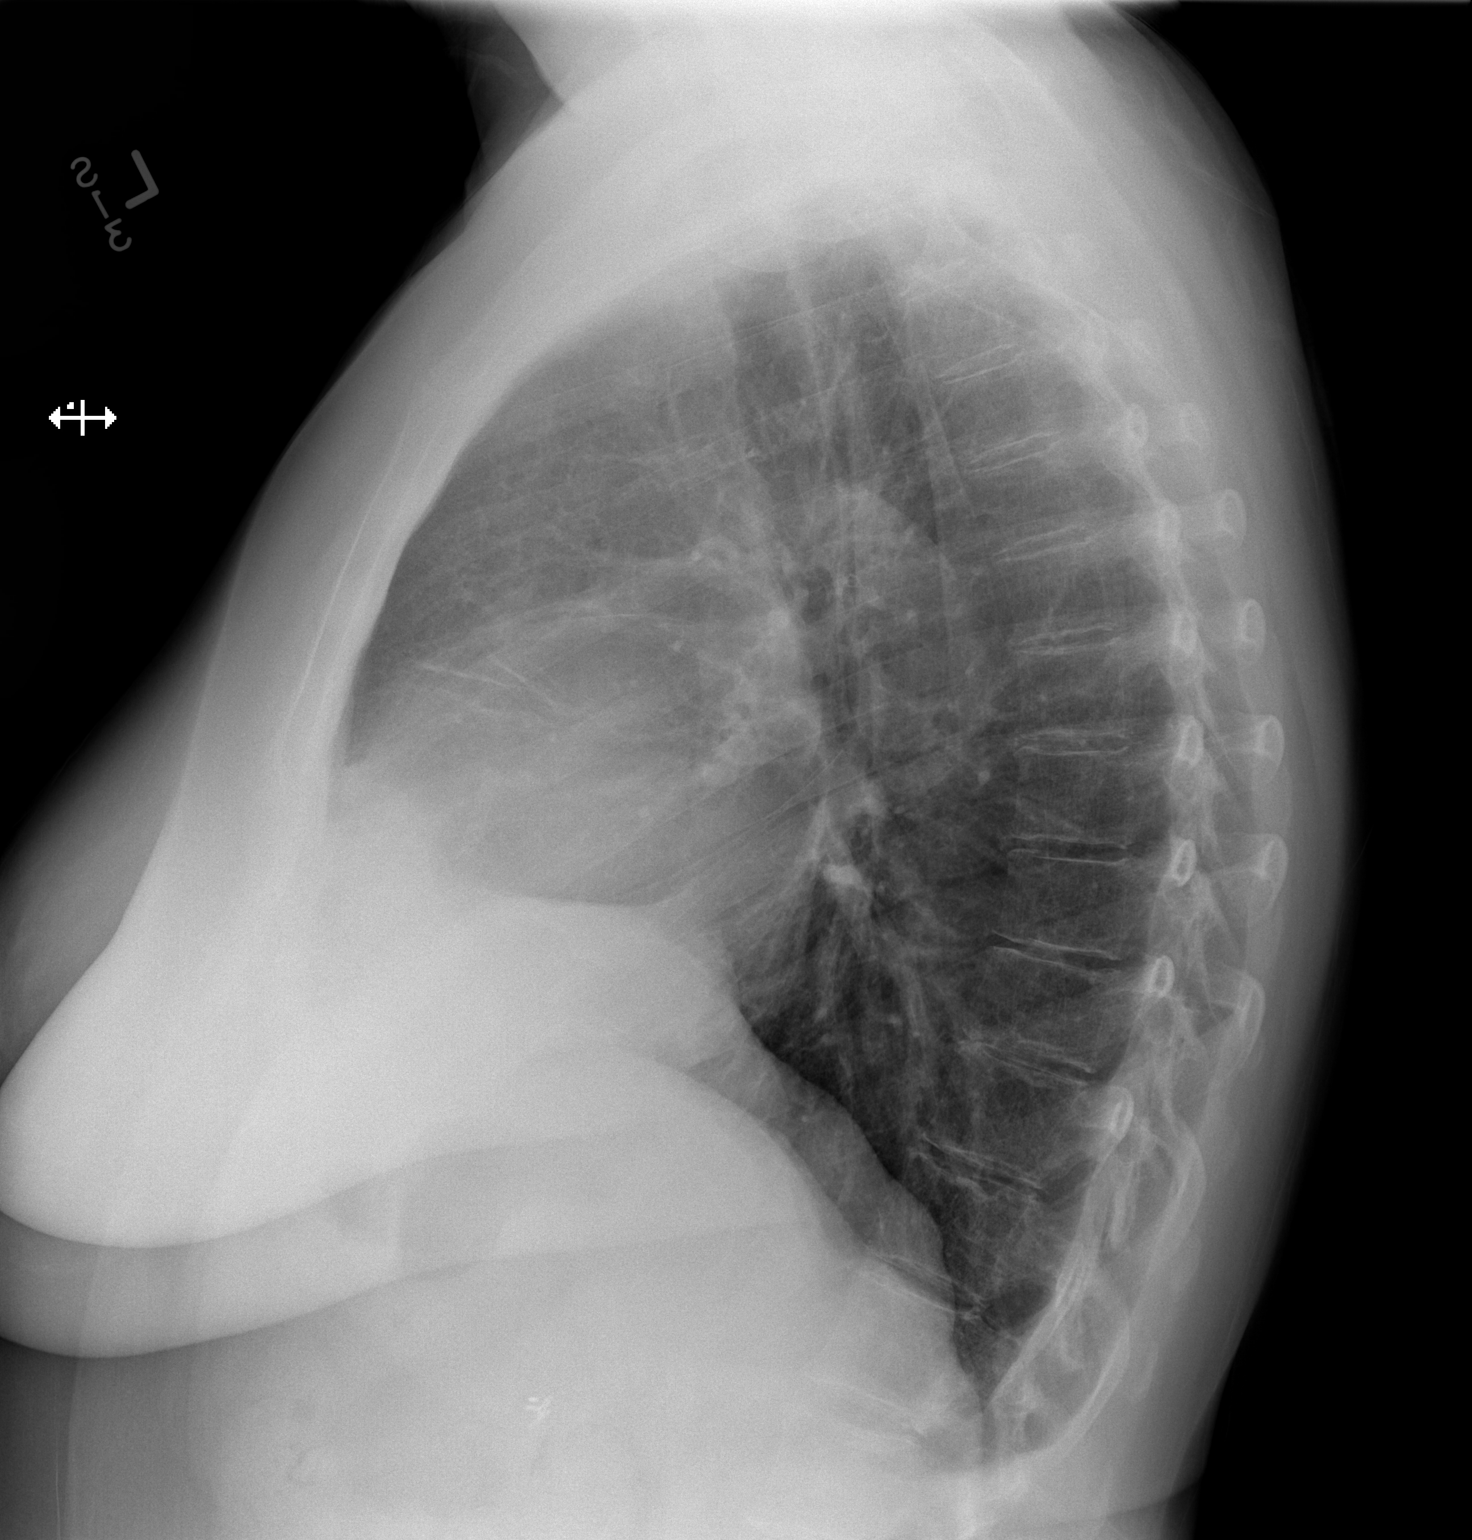

[2 of 2 positions shown; findings below may reference images not displayed]

FINDINGS: Scarring or atelectasis in the right middle lobe. No pleural
effusion or consolidation. Normal cardiomediastinal silhouette. No
pneumothorax. Surgical clips in the right upper quadrant.
IMPRESSION: Linear scarring or atelectasis in the right middle lobe. No
radiographic evidence for acute cardiopulmonary abnormality.

## 2018-11-09 ENCOUNTER — Other Ambulatory Visit: Payer: Self-pay | Admitting: Orthopaedic Surgery

## 2018-11-12 NOTE — Pre-Procedure Instructions (Signed)
[id number] [address], [place] [id number] [place] [address] Phone: [phone] Fax: [phone]  [place], [place] [address] [place] [address] [place] [address] [place] [address] Phone: [phone] Fax: [phone]      Your procedure is scheduled on Thursday November 12th.  Report to [facility] Main Entrance "A" at 5:30 A.M., and check in at the Admitting office.  Call this number if you have problems the morning of surgery:  [phone]  Call [phone] if you have any questions prior to your surgery date Monday-Friday 8am-4pm    Remember:  Do not eat after midnight the night before your surgery  You may drink clear liquids until 4:30 the morning of your surgery.   Clear liquids allowed are: Water, Non-Citrus Juices (without pulp), Carbonated Beverages, Clear Tea, Black Coffee Only, and Gatorade. Your surgeon has prescribed a pre-surgery Ensure Carbohydrate drink for you to complete by 4:30 am the morning of surgery. Patient Instructions  . The night before surgery:  o No food after midnight. ONLY clear liquids after midnight  . The day of surgery (if you do NOT have diabetes):  o Drink ONE (1) Pre-Surgery Clear Ensure as directed.   o This drink was given to you during your hospital  pre-op appointment visit. o The pre-op nurse will instruct you on the time to drink the  Pre-Surgery Ensure depending on your surgery time. o Finish the drink at the designated time by the pre-op nurse.  o Nothing else to drink after completing the  Pre-Surgery Clear Ensure.        If you have questions, please contact your surgeon's office.     Take these medicines the morning of surgery with A SIP OF WATER  carvedilol (COREG)  FLUoxetine (PROZAC) levothyroxine (SYNTHROID) rosuvastatin (CRESTOR) methocarbamol (ROBAXIN) if needed traMADol (ULTRAM) if needed  Follow your  surgeon's instructions on when to stop Asprin.  If no instructions were given by your surgeon then you will need to call the office to get those instructions.     As of today, STOP taking any Aspirin (unless otherwise instructed by your surgeon),celecoxib (CELEBREX), Aleve, Naproxen, Ibuprofen, Motrin, Advil, Goody's, BC's, all herbal medications, fish oil, and all vitamins.    The Morning of Surgery  Do not wear jewelry, make-up or nail polish.  Do not wear lotions, powders, or perfumes/colognes, or deodorant  Do not shave 48 hours prior to surgery.  Men may shave face and neck.  Do not bring valuables to the hospital.  [facility] is not responsible for any belongings or valuables.  If you are a smoker, DO NOT Smoke 24 hours prior to surgery IF you wear a CPAP at night please bring your mask, tubing, and machine the morning of surgery   Remember that you must have someone to transport you home after your surgery, and remain with you for 24 hours if you are discharged the same day.   Contacts, glasses, hearing aids, dentures or bridgework may not be worn into surgery.    Leave your suitcase in the car.  After surgery it may be brought to your room.  For patients admitted to the hospital, discharge time will be determined by your treatment team.  Patients discharged the day of surgery will not be allowed to drive home.    Special instructions:   [place]- Preparing For Surgery  Before surgery, you can play an important role. Because skin is not sterile, your skin needs to be as free of germs as possible. You can reduce the number of germs on your skin by washing with CHG (chlorahexidine gluconate) Soap before surgery.  CHG is an antiseptic cleaner which kills germs and bonds with the skin to continue killing germs even after washing.    Oral Hygiene is also important to reduce your risk of infection.  Remember - BRUSH YOUR TEETH THE MORNING OF SURGERY WITH YOUR REGULAR TOOTHPASTE   Please do not use if you have an allergy to CHG or antibacterial soaps. If your skin becomes reddened/irritated stop using the CHG.  Do not shave (including legs and underarms) for at least 48 hours prior to first CHG shower. It is OK to shave your face.  Please follow these instructions carefully.   1. Shower the NIGHT BEFORE SURGERY and the MORNING OF SURGERY with CHG Soap.   2. If you chose to wash your hair, wash your hair first as usual with your normal shampoo.  3. After you shampoo, rinse your hair and body thoroughly to remove the shampoo.  4. Use CHG as you would any other liquid soap. You can apply CHG directly to the skin and wash gently with a scrungie or a clean washcloth.   5. Apply the CHG Soap to your body ONLY FROM THE NECK DOWN.  Do not use on open wounds or open sores. Avoid contact with your eyes, ears, mouth and genitals (private parts). Wash Face and genitals (private parts)  with your normal soap.   6. Wash thoroughly, paying special attention to the area where your surgery will be performed.  7. Thoroughly rinse your body with warm water from the neck down.  8. DO NOT shower/wash with your normal soap after using and rinsing off the CHG Soap.  9. Pat yourself dry with a CLEAN TOWEL.  10. Wear CLEAN PAJAMAS to bed the night before surgery, wear comfortable clothes the morning of surgery  11. Place CLEAN SHEETS on your bed the night of your first shower and DO NOT SLEEP WITH PETS.    Day of Surgery:  Do not apply any deodorants/lotions. Please shower the morning of surgery with the CHG soap  Please wear clean clothes to the hospital/surgery center.   Remember to brush your teeth WITH YOUR REGULAR TOOTHPASTE.   Please read over the following fact sheets that you were given.

## 2018-11-13 ENCOUNTER — Encounter (HOSPITAL_COMMUNITY)
Admission: RE | Admit: 2018-11-13 | Discharge: 2018-11-13 | Disposition: A | Discharge status: Home / Self Care | Payer: BC Managed Care – PPO | Source: Ambulatory Visit | Attending: Orthopaedic Surgery | Admitting: Orthopaedic Surgery

## 2018-11-13 ENCOUNTER — Encounter (HOSPITAL_COMMUNITY): Payer: Self-pay

## 2018-11-13 ENCOUNTER — Other Ambulatory Visit (HOSPITAL_COMMUNITY)
Admission: RE | Admit: 2018-11-13 | Discharge: 2018-11-13 | Disposition: A | Discharge status: Home / Self Care | Payer: BC Managed Care – PPO | Source: Ambulatory Visit | Attending: Orthopaedic Surgery | Admitting: Orthopaedic Surgery

## 2018-11-13 ENCOUNTER — Other Ambulatory Visit: Payer: Self-pay

## 2018-11-13 DIAGNOSIS — M797 Fibromyalgia: Secondary | ICD-10-CM | POA: Diagnosis not present

## 2018-11-13 DIAGNOSIS — Z7982 Long term (current) use of aspirin: Secondary | ICD-10-CM | POA: Insufficient documentation

## 2018-11-13 DIAGNOSIS — D367 Benign neoplasm of other specified sites: Secondary | ICD-10-CM | POA: Diagnosis not present

## 2018-11-13 DIAGNOSIS — D563 Thalassemia minor: Secondary | ICD-10-CM | POA: Diagnosis not present

## 2018-11-13 DIAGNOSIS — E039 Hypothyroidism, unspecified: Secondary | ICD-10-CM | POA: Diagnosis not present

## 2018-11-13 DIAGNOSIS — E7800 Pure hypercholesterolemia, unspecified: Secondary | ICD-10-CM | POA: Insufficient documentation

## 2018-11-13 DIAGNOSIS — Z01812 Encounter for preprocedural laboratory examination: Secondary | ICD-10-CM | POA: Insufficient documentation

## 2018-11-13 DIAGNOSIS — I2510 Atherosclerotic heart disease of native coronary artery without angina pectoris: Secondary | ICD-10-CM | POA: Insufficient documentation

## 2018-11-13 DIAGNOSIS — I10 Essential (primary) hypertension: Secondary | ICD-10-CM | POA: Diagnosis not present

## 2018-11-13 DIAGNOSIS — Z79899 Other long term (current) drug therapy: Secondary | ICD-10-CM | POA: Insufficient documentation

## 2018-11-13 DIAGNOSIS — Z955 Presence of coronary angioplasty implant and graft: Secondary | ICD-10-CM | POA: Insufficient documentation

## 2018-11-13 HISTORY — DX: Personal history of urinary calculi: Z87.442

## 2018-11-13 HISTORY — DX: Headache, unspecified: R51.9

## 2018-11-13 HISTORY — DX: Thyrotoxicosis, unspecified without thyrotoxic crisis or storm: E05.90

## 2018-11-13 LAB — SARS CORONAVIRUS 2 (TAT 6-24 HRS): SARS Coronavirus 2: NEGATIVE

## 2018-11-13 LAB — CBC
HCT: 38.9 % (ref 36.0–46.0)
Hemoglobin: 11.5 g/dL — ABNORMAL LOW (ref 12.0–15.0)
MCH: 20.6 pg — ABNORMAL LOW (ref 26.0–34.0)
MCHC: 29.6 g/dL — ABNORMAL LOW (ref 30.0–36.0)
MCV: 69.7 fL — ABNORMAL LOW (ref 80.0–100.0)
Platelets: 207 10*3/uL (ref 150–400)
RBC: 5.58 MIL/uL — ABNORMAL HIGH (ref 3.87–5.11)
RDW: 15.5 % (ref 11.5–15.5)
WBC: 6.2 10*3/uL (ref 4.0–10.5)
nRBC: 0.5 % — ABNORMAL HIGH (ref 0.0–0.2)

## 2018-11-13 LAB — BASIC METABOLIC PANEL
Anion gap: 9 (ref 5–15)
BUN: 14 mg/dL (ref 6–20)
CO2: 23 mmol/L (ref 22–32)
Calcium: 9.8 mg/dL (ref 8.9–10.3)
Chloride: 109 mmol/L (ref 98–111)
Creatinine, Ser: 0.64 mg/dL (ref 0.44–1.00)
GFR calc Af Amer: >60 mL/min (ref >60)
GFR calc non Af Amer: >60 mL/min (ref >60)
Glucose, Bld: 144 mg/dL — ABNORMAL HIGH (ref 70–99)
Potassium: 4 mmol/L (ref 3.5–5.1)
Sodium: 141 mmol/L (ref 135–145)

## 2018-11-13 NOTE — Progress Notes (Signed)
PCP - Keane Police, MD Cardiologist - Dr. Alroy Dust at [facility])  PPM/ICD - Denies Device Orders - N/A Rep Notified - N/A  Chest x-ray - Denies EKG - per patient, 07/2018. Tracing requested from [facility] - per patient, 2017, requested from [facility] - 05/30/2014 Cardiac Cath - 12/12/13  Sleep Study - Denies CPAP - N/A  Fasting Blood Sugar - N/A  Aspirin Instructions: per patient, stop 7 days prior to surgery. Last dose 11/07/2018.  ERAS Protcol - Yes PRE-SURGERY Ensure or G2- Yes, Pre-Surgery Ensure  COVID TEST- 11/13/2018   Anesthesia review: Yes, Heart hx.  Patient denies shortness of breath, fever, cough and chest pain at PAT appointment   Coronavirus Screening  Have you experienced the following symptoms:  Cough yes/no: No Fever (>100.80F)  yes/no: No Runny nose yes/no: No Sore throat yes/no: No Difficulty breathing/shortness of breath  yes/no: No  Have you or a family member traveled in the last 14 days and where? yes/no: No   If the patient indicates "YES" to the above questions, their PAT will be rescheduled to limit the exposure to others and, the surgeon will be notified. THE PATIENT WILL NEED TO BE ASYMPTOMATIC FOR 14 DAYS.   If the patient is not experiencing any of these symptoms, the PAT nurse will instruct them to NOT bring anyone with them to their appointment since they may have these symptoms or traveled as well.   Please remind your patients and families that hospital visitation restrictions are in effect and the importance of the restrictions.     All instructions explained to the patient, with a verbal understanding of the material. Patient agrees to go over the instructions while at home for a better understanding. Patient also instructed to self quarantine after being tested for COVID-19. The opportunity to ask questions was provided.

## 2018-11-14 NOTE — Progress Notes (Signed)
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 Anesthesia type is posted for Choice.  History includes DIFFICULT INTUBATION (with small mouth), never smoker, HTN, Graves' disease (s/p ablation; post-ablation hypothyroidism), CAD (s/p RCA Promus DES 04/27/11), migraines, thalassemia minor, hypercholesterolemia.   In regards to [place], there is a "Unrecognized Difficult Intubation" form dated 07/23/12 from [facility] (now [facility]) scanned under the Media Tab, Correspondence, Encounter date 12/25/12. Reasons for difficulty included for cervical extension, short thyromental space, limited mouth opening, prominent teeth (overbite), anterior larynx. Ventilation was easy. Success was achieved with the patient unconscious/paralyzed using Glidescope. An alternative airway device after induction of anesthesia recommended for future anesthetics. 04/30/12 anesthesia record also obtained from [place] (Media Tab, Correspondence, 03/29/16) that indicated on that day, patient had grade III view with MIL 2 blade, grade I view with AirTraq (guided video intubation). 7.0 ETT placed after three attempts. She had an LMA bilateral finger fusions 12/25/12 and a spinal for 03/29/16 right TKA done at [facility]. Previous note indicated that she reported upper central/lateral incisors with caps and right upper molar with a crown.   Last seen by cardiologist Dr. Rosalita Chessman on 10/09/18 for routine follow-up and preoperative risk assessment. He wrote, " she is stable from the cardiovascular standpoint to proceed.  No modifiable risk that will affect her  outcome."   Last ASA 11/07/18.  COVID-19 test on 11/13/18 was negative.  Based on currently available information I would anticipate that she could proceed as planned if no acute changes.   VS: BP 139/67   Pulse 81   Temp 36.6 C (Oral)   Resp 17   Ht 5\' 3"  (1.6 m)   Wt 74.9 kg   SpO2 94%   BMI 29.25 kg/m    PROVIDERS: Keane Police, MD is PCP Cleora Fleet, MD is cardiologist ([place]). Last visit 10/09/18.    LABS: Labs reviewed: Acceptable for surgery. (all labs ordered are listed, but only abnormal results are displayed)  Labs Reviewed  CBC - Abnormal; Notable for the following components:      Result Value   RBC 5.58 (*)    Hemoglobin 11.5 (*)    MCV 69.7 (*)    MCH 20.6 (*)    MCHC 29.6 (*)    nRBC 0.5 (*)    All other components within normal limits  BASIC METABOLIC PANEL - Abnormal; Notable for the following components:   Glucose, Bld 144 (*)    All other components within normal limits     EKG: 10/09/18 ([facility]): Sinus rhythm.  Negative T waves, possible anterior ischemia. (Dr. Rosalita Chessman notes, "Chronic T-wave inversion in V1 to V6 on electrocardiogram")   CV: (She thought she had a stress test in 2017, but no stress test received from [facility] and last ischemic evaluation mentioned was 12/2013 LHC with last echo in 2016.)  Echo 05/30/14 ([facility]; Media Tab, Correspondence, 03/29/16):  Impression: Normal left ventricular systolic function, EF [id number]. Normal chamber dimensions. Mild tricuspid regurgitation.  Cardiac cath 12/12/13 ([place]; Media Tab, Correspondence, 03/29/16):  FINDINGS:  Left main is normal. This vessel bifurcates in the LAD and left circumflex. LAD has an average-sized diagonal branch with no disease seen, there are minor irregularities in the proximal LAD in the range of 10%. Left circumflex has first bifurcating obtuse marginal branch, with no significant disease. RCA has a patent stent in the  proximal segment. There is 10% narrowing at the ostium of the vessel and 30% narrowing of the distal RCA. IMPRESSION: Nonobstructive epicardial coronary artery disease with patent stent in the right coronary artery. The patient will be continued on medical therapy.   Past Medical History:  Diagnosis Date  . Abnormally small mouth   . Complication of anesthesia   . Coronary artery disease   . 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 Difficult intubation 07/2012  . Fibromyalgia   . Headache    migraines  . High cholesterol   . History of Graves' disease   . History of kidney stones   . History of migraine   . Hypertension    under control with med., has been on med. x 4 yr.  . Hyperthyroidism   . Hypothyroidism   . Kidney stones   . Osteoarthritis   . S/P coronary artery stent placement 04/2011   states right coronary artery  . Thalassemia minor     Past Surgical History:  Procedure Laterality Date  . ABDOMINAL HERNIA REPAIR  04/2012  . ABDOMINAL SURGERY  01/2011   for intestinal blockage  . APPENDECTOMY  1984  . CARDIAC CATHETERIZATION    . CHOLECYSTECTOMY  07/2012  . COLON SURGERY    . COLONOSCOPY    . CORONARY ANGIOPLASTY  04/2011   stent x 1  . DILATION AND CURETTAGE OF UTERUS     1989  . DISTAL INTERPHALANGEAL JOINT FUSION Bilateral 12/25/2012   Procedure: DISTAL INTERPHALANGEAL JOINT FUSION RIGHT AND LEFT SMALL FINGER;  Surgeon: Cammie Sickle., MD;  Location: [place];  Service: Orthopedics;  Laterality: Bilateral;  left and right small finger  . ESOPHAGOGASTRODUODENOSCOPY    . FINGER SURGERY    . HAND SURGERY  2014   Bilateral pinky finger distal joint fusion  . HAND SURGERY  2016   Right Thumb reconstruction and left middle finger distal joint fusion  . HAND SURGERY     Scar tissue removal right palm  . HERNIA REPAIR    . JOINT REPLACEMENT     right kne total knee replacement  . KNEE ARTHROPLASTY    .  OVARIAN CYST REMOVAL    . TOTAL KNEE ARTHROPLASTY Right 03/29/2016  . TOTAL KNEE ARTHROPLASTY Right 03/29/2016   Procedure: TOTAL KNEE ARTHROPLASTY;  Surgeon: Garald Balding, MD;  Location: [place];  Service: Orthopedics;  Laterality: Right;  . TUBAL LIGATION Bilateral 1993  . WISDOM TOOTH EXTRACTION      MEDICATIONS: . aspirin EC 81 MG tablet  . carvedilol (COREG) 25 MG tablet  . celecoxib (CELEBREX) 200 MG capsule  . Cholecalciferol (VITAMIN D) 2000 units CAPS  . cyclobenzaprine (FLEXERIL) 5 MG tablet  . Fe Fum-FA-B Cmp-C-Zn-Mg-Mn-Cu (HEMOCYTE PLUS) 106-1 MG CAPS  . FLUoxetine (PROZAC) 20 MG capsule  . gabapentin (NEURONTIN) 100 MG capsule  . levothyroxine (SYNTHROID) 100 MCG tablet  . methocarbamol (ROBAXIN) 500 MG tablet  . rosuvastatin (CRESTOR) 10 MG tablet  . SUMAtriptan (IMITREX) 50 MG tablet  . traMADol (ULTRAM) 50 MG tablet  . YUVAFEM 10 MCG TABS vaginal  tablet  . zolpidem (AMBIEN) 5 MG tablet   No current facility-administered medications for this encounter.      Myra Gianotti, PA-C Surgical Short Stay/Anesthesiology [facility] Phone [phone] [facility] Phone [phone] 11/14/2018 9:59 AM

## 2018-11-14 NOTE — Anesthesia Preprocedure Evaluation (Addendum)
Anesthesia Evaluation  Patient identified by MRN, date of birth, ID band Patient awake    Reviewed: Allergy & Precautions, NPO status , Patient's Chart, lab work & pertinent test results  History of Anesthesia Complications (+) DIFFICULT AIRWAY and history of anesthetic complications  Airway Mallampati: IV  TM Distance: >3 FB   Mouth opening: Limited Mouth Opening  Dental  (+) Teeth Intact, Dental Advisory Given, Caps,    Pulmonary    breath sounds clear to auscultation       Cardiovascular hypertension, Pt. on medications and Pt. on home beta blockers + CAD   Rhythm:Regular Rate:Normal     Neuro/Psych    GI/Hepatic negative GI ROS, Neg liver ROS,   Endo/Other  Hypothyroidism Hyperthyroidism   Renal/GU Renal disease     Musculoskeletal  (+) Arthritis ,   Abdominal   Peds  Hematology  (+) anemia ,   Anesthesia Other Findings   Reproductive/Obstetrics                           Anesthesia Physical Anesthesia Plan  ASA: III  Anesthesia Plan: General   Post-op Pain Management:    Induction: Intravenous  PONV Risk Score and Plan: 3 and Ondansetron and Dexamethasone  Airway Management Planned: LMA  Additional Equipment:   Intra-op Plan:   Post-operative Plan: Extubation in OR  Informed Consent: I have reviewed the patients History and Physical, chart, labs and discussed the procedure including the risks, benefits and alternatives for the proposed anesthesia with the patient or authorized representative who has indicated his/her understanding and acceptance.     Dental advisory given  Plan Discussed with: CRNA, Anesthesiologist and Surgeon  Anesthesia Plan Comments: (See PAT note written 11/14/2018 by Myra Gianotti, PA-C for details regarding DIFFICULT AIRWAY History. Successful intubation using Glidescope in the past. Has cardiology clearance from Dr. Rosalita Chessman. )        Anesthesia Quick Evaluation

## 2018-11-15 ENCOUNTER — Ambulatory Visit (HOSPITAL_COMMUNITY)
Admission: RE | Admit: 2018-11-15 | Discharge: 2018-11-15 | Disposition: A | Discharge status: Home / Self Care | Payer: BC Managed Care – PPO | Attending: Orthopaedic Surgery | Admitting: Orthopaedic Surgery

## 2018-11-15 ENCOUNTER — Encounter (HOSPITAL_COMMUNITY): Payer: Self-pay | Admitting: *Deleted

## 2018-11-15 ENCOUNTER — Ambulatory Visit (HOSPITAL_COMMUNITY): Payer: BC Managed Care – PPO | Admitting: Anesthesiology

## 2018-11-15 ENCOUNTER — Other Ambulatory Visit: Payer: Self-pay

## 2018-11-15 ENCOUNTER — Encounter (HOSPITAL_COMMUNITY)
Admission: RE | Disposition: A | Discharge status: Home / Self Care | Payer: Self-pay | Source: Home / Self Care | Attending: Orthopaedic Surgery

## 2018-11-15 ENCOUNTER — Ambulatory Visit (HOSPITAL_COMMUNITY): Payer: BC Managed Care – PPO | Admitting: Vascular Surgery

## 2018-11-15 DIAGNOSIS — D2122 Benign neoplasm of connective and other soft tissue of left lower limb, including hip: Secondary | ICD-10-CM | POA: Insufficient documentation

## 2018-11-15 DIAGNOSIS — Z880 Allergy status to penicillin: Secondary | ICD-10-CM | POA: Diagnosis not present

## 2018-11-15 DIAGNOSIS — M19042 Primary osteoarthritis, left hand: Secondary | ICD-10-CM | POA: Diagnosis not present

## 2018-11-15 DIAGNOSIS — Z882 Allergy status to sulfonamides status: Secondary | ICD-10-CM | POA: Insufficient documentation

## 2018-11-15 DIAGNOSIS — Z888 Allergy status to other drugs, medicaments and biological substances status: Secondary | ICD-10-CM | POA: Insufficient documentation

## 2018-11-15 DIAGNOSIS — E7800 Pure hypercholesterolemia, unspecified: Secondary | ICD-10-CM | POA: Diagnosis not present

## 2018-11-15 DIAGNOSIS — Z885 Allergy status to narcotic agent status: Secondary | ICD-10-CM | POA: Diagnosis not present

## 2018-11-15 DIAGNOSIS — M19041 Primary osteoarthritis, right hand: Secondary | ICD-10-CM | POA: Diagnosis not present

## 2018-11-15 DIAGNOSIS — Z96651 Presence of right artificial knee joint: Secondary | ICD-10-CM | POA: Insufficient documentation

## 2018-11-15 DIAGNOSIS — Z79899 Other long term (current) drug therapy: Secondary | ICD-10-CM | POA: Diagnosis not present

## 2018-11-15 DIAGNOSIS — I10 Essential (primary) hypertension: Secondary | ICD-10-CM | POA: Diagnosis not present

## 2018-11-15 DIAGNOSIS — Z7982 Long term (current) use of aspirin: Secondary | ICD-10-CM | POA: Diagnosis not present

## 2018-11-15 DIAGNOSIS — R2242 Localized swelling, mass and lump, left lower limb: Secondary | ICD-10-CM | POA: Diagnosis present

## 2018-11-15 DIAGNOSIS — G43909 Migraine, unspecified, not intractable, without status migrainosus: Secondary | ICD-10-CM | POA: Diagnosis not present

## 2018-11-15 DIAGNOSIS — D649 Anemia, unspecified: Secondary | ICD-10-CM | POA: Diagnosis not present

## 2018-11-15 DIAGNOSIS — Z955 Presence of coronary angioplasty implant and graft: Secondary | ICD-10-CM | POA: Diagnosis not present

## 2018-11-15 DIAGNOSIS — I2510 Atherosclerotic heart disease of native coronary artery without angina pectoris: Secondary | ICD-10-CM | POA: Insufficient documentation

## 2018-11-15 DIAGNOSIS — E039 Hypothyroidism, unspecified: Secondary | ICD-10-CM | POA: Diagnosis not present

## 2018-11-15 DIAGNOSIS — Z79890 Hormone replacement therapy (postmenopausal): Secondary | ICD-10-CM | POA: Diagnosis not present

## 2018-11-15 HISTORY — PX: EXCISION MASS LOWER EXTREMETIES: SHX6705

## 2018-11-15 SURGERY — EXCISION MASS LOWER EXTREMITIES
Anesthesia: General | Site: Foot | Laterality: Left

## 2018-11-15 MED ORDER — BUPIVACAINE HCL (PF) 0.5 % IJ SOLN
INTRAMUSCULAR | Status: AC
  Filled 2018-11-15: qty 30

## 2018-11-15 MED ORDER — FENTANYL CITRATE (PF) 100 MCG/2ML IJ SOLN
INTRAMUSCULAR | Status: DC | PRN
  Administered 2018-11-15 (×2): 50 ug via INTRAVENOUS

## 2018-11-15 MED ORDER — STERILE WATER FOR IRRIGATION IR SOLN
Status: DC | PRN
  Administered 2018-11-15: 1000 mL

## 2018-11-15 MED ORDER — BUPIVACAINE HCL (PF) 0.5 % IJ SOLN
INTRAMUSCULAR | Status: DC | PRN
  Administered 2018-11-15: 30 mL

## 2018-11-15 MED ORDER — MIDAZOLAM HCL 2 MG/2ML IJ SOLN
INTRAMUSCULAR | Status: AC
  Filled 2018-11-15: qty 2

## 2018-11-15 MED ORDER — DEXAMETHASONE SODIUM PHOSPHATE 10 MG/ML IJ SOLN
INTRAMUSCULAR | Status: AC
  Filled 2018-11-15: qty 1

## 2018-11-15 MED ORDER — MIDAZOLAM HCL 5 MG/5ML IJ SOLN
INTRAMUSCULAR | Status: DC | PRN
  Administered 2018-11-15: 2 mg via INTRAVENOUS

## 2018-11-15 MED ORDER — PHENYLEPHRINE HCL-NACL 10-0.9 MG/250ML-% IV SOLN
INTRAVENOUS | Status: DC | PRN
  Administered 2018-11-15: 25 ug/min via INTRAVENOUS

## 2018-11-15 MED ORDER — OXYCODONE HCL 5 MG PO TABS: 5.0000 mg | ORAL_TABLET | Freq: Four times a day (QID) | ORAL | 0 refills | Status: AC | PRN

## 2018-11-15 MED ORDER — LACTATED RINGERS IV SOLN
INTRAVENOUS | Status: DC | PRN
  Administered 2018-11-15: 07:00:00 via INTRAVENOUS

## 2018-11-15 MED ORDER — DEXAMETHASONE SODIUM PHOSPHATE 4 MG/ML IJ SOLN
INTRAMUSCULAR | Status: DC | PRN
  Administered 2018-11-15: 4 mg via INTRAVENOUS

## 2018-11-15 MED ORDER — LIDOCAINE 2% (20 MG/ML) 5 ML SYRINGE
INTRAMUSCULAR | Status: AC
  Filled 2018-11-15: qty 5

## 2018-11-15 MED ORDER — FENTANYL CITRATE (PF) 100 MCG/2ML IJ SOLN
INTRAMUSCULAR | Status: AC
  Filled 2018-11-15: qty 2

## 2018-11-15 MED ORDER — LIDOCAINE HCL (CARDIAC) PF 100 MG/5ML IV SOSY
PREFILLED_SYRINGE | INTRAVENOUS | Status: DC | PRN
  Administered 2018-11-15: 80 mg via INTRAVENOUS

## 2018-11-15 MED ORDER — ONDANSETRON HCL 4 MG/2ML IJ SOLN
INTRAMUSCULAR | Status: AC
  Filled 2018-11-15: qty 2

## 2018-11-15 MED ORDER — PROPOFOL 10 MG/ML IV BOLUS
INTRAVENOUS | Status: DC | PRN
  Administered 2018-11-15: 200 mg via INTRAVENOUS

## 2018-11-15 MED ORDER — PROPOFOL 10 MG/ML IV BOLUS
INTRAVENOUS | Status: AC
  Filled 2018-11-15: qty 20

## 2018-11-15 MED ORDER — PHENYLEPHRINE 40 MCG/ML (10ML) SYRINGE FOR IV PUSH (FOR BLOOD PRESSURE SUPPORT)
PREFILLED_SYRINGE | INTRAVENOUS | Status: DC | PRN
  Administered 2018-11-15 (×5): 80 ug via INTRAVENOUS

## 2018-11-15 MED ORDER — FENTANYL CITRATE (PF) 250 MCG/5ML IJ SOLN
INTRAMUSCULAR | Status: AC
  Filled 2018-11-15: qty 5

## 2018-11-15 MED ORDER — POVIDONE-IODINE 10 % EX SWAB: 2.0000 "application " | Freq: Once | CUTANEOUS | Status: DC

## 2018-11-15 MED ORDER — PHENYLEPHRINE 40 MCG/ML (10ML) SYRINGE FOR IV PUSH (FOR BLOOD PRESSURE SUPPORT)
PREFILLED_SYRINGE | INTRAVENOUS | Status: AC
  Filled 2018-11-15: qty 10

## 2018-11-15 MED ORDER — ONDANSETRON HCL 4 MG/2ML IJ SOLN
INTRAMUSCULAR | Status: DC | PRN
  Administered 2018-11-15: 4 mg via INTRAVENOUS

## 2018-11-15 MED ORDER — 0.9 % SODIUM CHLORIDE (POUR BTL) OPTIME
TOPICAL | Status: DC | PRN
  Administered 2018-11-15: 08:00:00 1000 mL

## 2018-11-15 MED ORDER — FENTANYL CITRATE (PF) 100 MCG/2ML IJ SOLN
25.0000 ug | INTRAMUSCULAR | Status: DC | PRN
  Administered 2018-11-15: 25 ug via INTRAVENOUS

## 2018-11-15 MED ORDER — CLINDAMYCIN PHOSPHATE 900 MG/50ML IV SOLN
900.0000 mg | INTRAVENOUS | Status: AC
  Administered 2018-11-15: 900 mg via INTRAVENOUS
  Filled 2018-11-15: qty 50

## 2018-11-15 SURGICAL SUPPLY — 53 items
BANDAGE ESMARK 6X9 LF (GAUZE/BANDAGES/DRESSINGS) IMPLANT
BNDG CMPR 9X4 STRL LF SNTH (GAUZE/BANDAGES/DRESSINGS) ×1
BNDG CMPR 9X6 STRL LF SNTH (GAUZE/BANDAGES/DRESSINGS)
BNDG CMPR MED 10X6 ELC LF (GAUZE/BANDAGES/DRESSINGS) ×1
BNDG COHESIVE 4X5 TAN STRL (GAUZE/BANDAGES/DRESSINGS) IMPLANT
BNDG ELASTIC 4X5.8 VLCR STR LF (GAUZE/BANDAGES/DRESSINGS) ×2 IMPLANT
BNDG ELASTIC 6X10 VLCR STRL LF (GAUZE/BANDAGES/DRESSINGS) ×3 IMPLANT
BNDG ESMARK 4X9 LF (GAUZE/BANDAGES/DRESSINGS) ×2 IMPLANT
BNDG ESMARK 6X9 LF (GAUZE/BANDAGES/DRESSINGS)
BNDG GAUZE ELAST 4 BULKY (GAUZE/BANDAGES/DRESSINGS) ×3 IMPLANT
COVER SURGICAL LIGHT HANDLE (MISCELLANEOUS) ×6 IMPLANT
COVER WAND RF STERILE (DRAPES) IMPLANT
CUFF TOURN SGL QUICK 34 (TOURNIQUET CUFF) ×3
CUFF TRNQT CYL 34X4.125X (TOURNIQUET CUFF) ×1 IMPLANT
DRAPE U-SHAPE 47X51 STRL (DRAPES) ×3 IMPLANT
DRSG PAD ABDOMINAL 8X10 ST (GAUZE/BANDAGES/DRESSINGS) IMPLANT
DRSG XEROFORM 1X8 (GAUZE/BANDAGES/DRESSINGS) ×2 IMPLANT
DURAPREP 26ML APPLICATOR (WOUND CARE) ×3 IMPLANT
ELECT REM PT RETURN 9FT ADLT (ELECTROSURGICAL) ×3
ELECTRODE REM PT RTRN 9FT ADLT (ELECTROSURGICAL) ×1 IMPLANT
GAUZE SPONGE 4X4 12PLY STRL (GAUZE/BANDAGES/DRESSINGS) ×2 IMPLANT
GAUZE SPONGE 4X4 12PLY STRL LF (GAUZE/BANDAGES/DRESSINGS) ×3 IMPLANT
GAUZE XEROFORM 1X8 LF (GAUZE/BANDAGES/DRESSINGS) ×3 IMPLANT
GLOVE BIOGEL M STRL SZ7.5 (GLOVE) ×3 IMPLANT
GLOVE INDICATOR 8.0 STRL GRN (GLOVE) ×3 IMPLANT
GOWN STRL REUS W/ TWL LRG LVL3 (GOWN DISPOSABLE) ×1 IMPLANT
GOWN STRL REUS W/ TWL XL LVL3 (GOWN DISPOSABLE) ×1 IMPLANT
GOWN STRL REUS W/TWL LRG LVL3 (GOWN DISPOSABLE) ×3
GOWN STRL REUS W/TWL XL LVL3 (GOWN DISPOSABLE) ×3
HANDPIECE INTERPULSE COAX TIP (DISPOSABLE) ×3
KIT BASIN OR (CUSTOM PROCEDURE TRAY) ×3 IMPLANT
KIT TURNOVER KIT B (KITS) ×3 IMPLANT
MANIFOLD NEPTUNE II (INSTRUMENTS) ×3 IMPLANT
NEEDLE 22X1 1/2 (OR ONLY) (NEEDLE) IMPLANT
NS IRRIG 1000ML POUR BTL (IV SOLUTION) ×3 IMPLANT
PACK ORTHO EXTREMITY (CUSTOM PROCEDURE TRAY) ×3 IMPLANT
PAD ARMBOARD 7.5X6 YLW CONV (MISCELLANEOUS) ×6 IMPLANT
PAD CAST 4YDX4 CTTN HI CHSV (CAST SUPPLIES) ×1 IMPLANT
PADDING CAST COTTON 4X4 STRL (CAST SUPPLIES) ×3
PADDING CAST SYNTHETIC 4 (CAST SUPPLIES) ×8
PADDING CAST SYNTHETIC 4X4 STR (CAST SUPPLIES) IMPLANT
SET HNDPC FAN SPRY TIP SCT (DISPOSABLE) ×1 IMPLANT
SPONGE LAP 18X18 RF (DISPOSABLE) IMPLANT
STOCKINETTE IMPERVIOUS 9X36 MD (GAUZE/BANDAGES/DRESSINGS) IMPLANT
SUT ETHILON 2 0 FS 18 (SUTURE) IMPLANT
SUT ETHILON 3 0 PS 1 (SUTURE) ×2 IMPLANT
SUT MNCRL AB 3-0 PS2 27 (SUTURE) ×2 IMPLANT
SUT MNCRL+ AB 3-0 CT1 36 (SUTURE) IMPLANT
SUT MONOCRYL AB 3-0 CT1 36IN (SUTURE) ×2
TUBE CONNECTING 12'X1/4 (SUCTIONS) ×1
TUBE CONNECTING 12X1/4 (SUCTIONS) ×2 IMPLANT
UNDERPAD 30X30 (UNDERPADS AND DIAPERS) ×3 IMPLANT
YANKAUER SUCT BULB TIP NO VENT (SUCTIONS) ×3 IMPLANT

## 2018-11-15 NOTE — Anesthesia Procedure Notes (Signed)
Procedure Name: LMA Insertion Date/Time: 11/15/2018 7:25 AM Performed by: Jenne Campus, CRNA Pre-anesthesia Checklist: Patient identified, Emergency Drugs available, Suction available and Patient being monitored Patient Re-evaluated:Patient Re-evaluated prior to induction Oxygen Delivery Method: Circle System Utilized Preoxygenation: Pre-oxygenation with 100% oxygen Induction Type: IV induction Ventilation: Mask ventilation without difficulty LMA: LMA inserted LMA Size: 4.0 Number of attempts: 1 Airway Equipment and Method: Bite block Placement Confirmation: positive ETCO2 and breath sounds checked- equal and bilateral Tube secured with: Tape Dental Injury: Teeth and Oropharynx as per pre-operative assessment

## 2018-11-15 NOTE — Transfer of Care (Signed)
Immediate Anesthesia Transfer of Care Note  Patient: Shannon Jennings  Procedure(s) Performed: LEFT FOOT DEEP MASS EXCISION (Left Foot)  Patient Location: PACU  Anesthesia Type:General  Level of Consciousness: awake, oriented and patient cooperative  Airway & Oxygen Therapy: Patient Spontanous Breathing and Patient connected to nasal cannula oxygen  Post-op Assessment: Report given to RN and Post -op Vital signs reviewed and stable  Post vital signs: Reviewed  Last Vitals:  Vitals Value Taken Time  BP 133/73 11/15/18 0824  Temp    Pulse 91 11/15/18 0826  Resp 13 11/15/18 0826  SpO2 98 % 11/15/18 0826  Vitals shown include unvalidated device data.  Last Pain:  Vitals:   11/15/18 0645  TempSrc:   PainSc: 0-No pain      Patients Stated Pain Goal: 3 ([id number])  Complications: No apparent anesthesia complications

## 2018-11-15 NOTE — Anesthesia Postprocedure Evaluation (Signed)
Anesthesia Post Note  Patient: Shannon Jennings  Procedure(s) Performed: LEFT FOOT DEEP MASS EXCISION (Left Foot)     Patient location during evaluation: PACU Anesthesia Type: General Level of consciousness: awake Pain management: pain level controlled Vital Signs Assessment: post-procedure vital signs reviewed and stable Respiratory status: patient connected to nasal cannula oxygen Cardiovascular status: stable Postop Assessment: no apparent nausea or vomiting Anesthetic complications: no    Last Vitals:  Vitals:   11/15/18 0901 11/15/18 0908  BP: (!) 143/80 (!) 158/83  Pulse: 80 80  Resp: 12 14  Temp: 36.6 C   SpO2: 93% 96%    Last Pain:  Vitals:   11/15/18 0908  TempSrc:   PainSc: 0-No pain                 Arvis Miguez

## 2018-11-15 NOTE — H&P (Signed)
Shannon Jennings is an 58 y.o. female.   Chief Complaint: Left foot plantar fibroma mass HPI: Shannon Jennings is here today for excision and removal of her plantar fibroma.  She has pain with ambulation and is failed conservative treatment.  Pain is located on plantar medial aspect of the foot.  She denies fevers or chills.  Denies any injury.  Is unsure if the mass has grown but states it has become more symptomatic.  Past Medical History:  Diagnosis Date  . Abnormally small mouth   . Complication of anesthesia   . Coronary artery disease   . 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 Difficult intubation 07/2012  . Fibromyalgia   . Headache    migraines  . High cholesterol   . History of Graves' disease   . History of kidney stones   . History of migraine   . Hypertension    under control with med., has been on med. x 4 yr.  . Hyperthyroidism   . Hypothyroidism   . Kidney stones   . Osteoarthritis   . S/P coronary artery stent placement 04/2011   states right coronary artery  . Thalassemia minor     Past Surgical History:  Procedure Laterality Date  . ABDOMINAL HERNIA REPAIR  04/2012  . ABDOMINAL SURGERY  01/2011   for intestinal blockage  . APPENDECTOMY  1984  . CARDIAC CATHETERIZATION    . CHOLECYSTECTOMY  07/2012  . COLON SURGERY    . COLONOSCOPY    . CORONARY ANGIOPLASTY  04/2011   stent x 1  . DILATION AND CURETTAGE OF UTERUS     1989  . DISTAL INTERPHALANGEAL JOINT FUSION Bilateral 12/25/2012   Procedure: DISTAL INTERPHALANGEAL JOINT FUSION RIGHT AND LEFT SMALL FINGER;  Surgeon: Cammie Sickle., MD;  Location: [place];  Service: Orthopedics;  Laterality: Bilateral;  left and right small finger  . ESOPHAGOGASTRODUODENOSCOPY    . FINGER SURGERY    . HAND SURGERY  2014   Bilateral pinky finger distal joint fusion  . HAND SURGERY  2016   Right Thumb reconstruction and left middle finger distal joint fusion  . HAND SURGERY      Scar tissue removal right palm  . HERNIA REPAIR    . JOINT REPLACEMENT     right kne total knee replacement  . KNEE ARTHROPLASTY    . OVARIAN CYST REMOVAL    . TOTAL KNEE ARTHROPLASTY Right 03/29/2016  . TOTAL KNEE ARTHROPLASTY Right 03/29/2016   Procedure: TOTAL KNEE ARTHROPLASTY;  Surgeon: Garald Balding, MD;  Location: [place];  Service: Orthopedics;  Laterality: Right;  . TUBAL LIGATION Bilateral 1993  . WISDOM TOOTH EXTRACTION      Family History  Problem Relation Age of Onset  . Heart disease Mother   . Arthritis Mother   . Cancer Sister   . Seizures Sister   . Arthritis Brother   . Diabetes Brother    Social History:  reports that she has never smoked. She has never used smokeless tobacco. She reports that she does not drink alcohol or use drugs.  Allergies:  Allergies  Allergen Reactions  . Tizanidine     Chest pain   . Toradol [Ketorolac Tromethamine]     Chest pain   . Dilaudid [Hydromorphone Hcl] Other (See Comments)    manic  . Penicillins Rash    Has patient had a PCN reaction causing immediate rash, facial/tongue/throat  swelling, SOB or lightheadedness with hypotension: No Has patient had a PCN reaction causing severe rash involving mucus membranes or skin necrosis: unknown Has patient had a PCN reaction that required hospitalization No Has patient had a PCN reaction occurring within the last 10 years: No If all of the above answers are "NO", then may proceed with Cephalosporin use.   . Sulfa Antibiotics Rash    Medications Prior to Admission  Medication Sig Dispense Refill  . aspirin EC 81 MG tablet Take 81 mg by mouth daily.     . carvedilol (COREG) 25 MG tablet Take 25 mg by mouth 2 (two) times daily.    . Cholecalciferol (VITAMIN D) 2000 units CAPS Take 2,000 Units by mouth daily.    . cyclobenzaprine (FLEXERIL) 5 MG tablet TAKE 1 TABLET BY MOUTH EVERYDAY AT BEDTIME (Patient taking differently: Take 5 mg by mouth at bedtime. ) 30 tablet 2  . Fe  Fum-FA-B Cmp-C-Zn-Mg-Mn-Cu (HEMOCYTE PLUS) 106-1 MG CAPS Take 1 capsule by mouth every evening.   5  . FLUoxetine (PROZAC) 20 MG capsule Take 20 mg by mouth daily.    Marland Kitchen levothyroxine (SYNTHROID) 100 MCG tablet Take 100 mcg by mouth daily before breakfast.     . methocarbamol (ROBAXIN) 500 MG tablet Take 1 tablet (500 mg total) by mouth 2 (two) times daily as needed. (Patient taking differently: Take 500 mg by mouth 2 (two) times daily as needed for muscle spasms. ) 180 tablet 0  . rosuvastatin (CRESTOR) 10 MG tablet Take 10 mg by mouth daily.  12  . SUMAtriptan (IMITREX) 50 MG tablet Take 25 mg by mouth every 2 (two) hours as needed for migraine.     . traMADol (ULTRAM) 50 MG tablet Take 50 mg by mouth 2 (two) times daily as needed for moderate pain.    Marland Kitchen YUVAFEM 10 MCG TABS vaginal tablet Place 10 mcg vaginally 2 (two) times a week.     . celecoxib (CELEBREX) 200 MG capsule TAKE ONE CAPSULE BY MOUTH ONCE DAILY WITH FOOD (Patient not taking: Reported on 11/07/2018) 90 capsule 4  . gabapentin (NEURONTIN) 100 MG capsule TAKE 2 CAPSULES BY MOUTH AT BEDTIME (Patient not taking: Reported on 11/07/2018) 60 capsule 2  . zolpidem (AMBIEN) 5 MG tablet Take 1 tablet (5 mg total) by mouth at bedtime as needed for sleep. (Patient not taking: Reported on 11/07/2018) 30 tablet 0    Results for orders placed or performed during the hospital encounter of 11/13/18 (from the past 48 hour(s))  SARS CORONAVIRUS 2 (TAT 6-24 HRS) Nasopharyngeal Nasopharyngeal Swab     Status: None   Collection Time: 11/13/18 12:15 PM   Specimen: Nasopharyngeal Swab  Result Value Ref Range   SARS Coronavirus 2 NEGATIVE NEGATIVE    Comment: (NOTE) SARS-CoV-2 target nucleic acids are NOT DETECTED. The SARS-CoV-2 RNA is generally detectable in upper and lower respiratory specimens during the acute phase of infection. Negative results do not preclude SARS-CoV-2 infection, do not rule out co-infections with other pathogens, and should not  be used as the sole basis for treatment or other patient management decisions. Negative results must be combined with clinical observations, patient history, and epidemiological information. The expected result is Negative. Fact Sheet for Patients: SugarRoll.be Fact Sheet for Healthcare Providers: https://www.woods-mathews.com/ This test is not yet approved or cleared by the [place] FDA and  has been authorized for detection and/or diagnosis of SARS-CoV-2 by FDA under an Emergency Use Authorization (EUA). This EUA will remain  in effect (meaning this test can be used) for the duration of the COVID-19 declaration under Section 56 4(b)(1) of the Act, 21 U.S.C. section 360bbb-3(b)(1), unless the authorization is terminated or revoked sooner. Performed at [facility], [place] [address] [place], [place] [address]    No results found.  Review of Systems  Constitutional: Negative.   HENT: Negative.   Eyes: Negative.   Respiratory: Negative.   Cardiovascular: Negative.   Gastrointestinal: Negative.   Musculoskeletal:       Left foot pain  Skin: Negative.   Neurological: Negative.   Psychiatric/Behavioral: Negative.     Blood pressure (!) 150/74, pulse 78, temperature 98 F (36.7 C), temperature source Oral, resp. rate 20, height 5\' 3"  (1.6 m), weight 74.9 kg, SpO2 98 %. Physical Exam  Vitals reviewed. Constitutional: She appears well-developed.  HENT:  Head: Normocephalic.  Eyes: Conjunctivae are normal.  Cardiovascular: Normal rate.  Respiratory: Effort normal.  Musculoskeletal:     Comments: Left foot demonstrates plantar mass on the medial aspect within the plantar fascial cord.  It is tender to palpation.  No overlying skin changes.  No significant swelling elsewhere.  No other areas of tenderness palpation.  Motor intact.  Sensation intact.  Palpable dorsalis pedis pulse.  Neurological: She is alert.  Skin: Skin is  warm.  Psychiatric: She has a normal mood and affect.     Assessment/Plan We will plan for left foot soft tissue mass excision.  We will send this to pathology for confirmation of diagnosis.  Postoperatively she will be on a soft dressing with the postoperative shoe and weight-bear flatfoot.  She understands the risk benefits alternatives of surgery which include but not limited to wound healing complications, infection, return of the mass, continued pain, damage to surrounding structures as well as the perioperative and anesthetic risk which include death.  She would like to proceed.  Erle Crocker, MD 11/15/2018, 7:07 AM

## 2018-11-15 NOTE — Op Note (Signed)
  Shannon Jennings female 58 y.o. 11/15/2018  PreOperative Diagnosis: Left foot plantar deep soft tissue mass  PostOperative Diagnosis: Same  PROCEDURE: Excision of left foot deep soft tissue mass  SURGEON: Melony Overly, MD  ASSISTANT: None  ANESTHESIA: General LMA with half percent Marcaine without epinephrine local anesthesia  FINDINGS: Left plantar foot deep soft tissue mass within the medial cord of the plantar fascia  IMPLANTS: None  INDICATIONS:58 y.o. female had a mass on the plantar aspect of her foot that failed conservative treatment the form of shoe modifications, orthotics, activity modifications and anti-inflammatories.  She has to have this mass removed given the size and the symptoms.  She understood the risk benefits alternatives of surgery which include but not limited to wound healing complications, infection, return of the mass, continued pain and damage surrounding structures.  She also understood the possibility needing further surgery in the perioperative anesthetic risk which include death.  She was proceed with surgery.  PROCEDURE: Patient was identified the preoperative holding area.  The left leg was marked by myself.  Consent was signed myself and the patient.  She is taken the operative suite placed supine the operative table.  General LMA anesthesia was induced without difficulty.  Preoperative antibiotics were given.  All bony prominences were well-padded.  Left lower extremity was prepped and draped in usual sterile fashion.  Surgical timeout is performed.  We began by making a longitudinal incision on the medial aspect of the foot at the transition of the skin to the glaborous skin.  This was taken sharply down through skin and subcutaneous tissue.  Then blunt dissection was used to identify the tendon of the abductor hallucis muscle.  This was retracted.  Then the interval between the flexor musculature and the plantar fascia was identified and created  bluntly.  Then the interval between the skin, subcutaneous tissue and the plantar aspect of the plantar fascia was identified and sharp dissection was performed about the plantar fashion.  It was adherent to the plantar skin.  There is an interval between the state was created.  The mass was identified and circumferentially excised sharply with 15 blade and pickups.  The entirety of the mass was excised.  The tourniquet was then released and hemostasis was obtained with direct compression.  The wound was irrigated copiously with normal saline.  Deep tissue was closed with 3-0 monocryl and the skin with 3-0 nylon. Soft dressing was placed.  She tolerated the procedure well. There were no complications.  Counts were correct.    POST OPERATIVE INSTRUCTIONS: Heel WB LLE Call office with concerns Keep dressing in place Follow up in 2 weeks for sound check  TOURNIQUET TIME:24 minutes  BLOOD LOSS:  Minimal         DRAINS: none         SPECIMEN: none       COMPLICATIONS:  * No complications entered in OR log *         Disposition: PACU - hemodynamically stable.         Condition: stable

## 2018-11-15 NOTE — Progress Notes (Signed)
Orthopedic Tech Progress Note Patient Details:  CERENITY HARDWELL 1960-06-12 [id number] PACU RN called requesting a postop shoe for patient Ortho Devices Type of Ortho Device: Postop shoe/boot Ortho Device/Splint Location: LLE Ortho Device/Splint Interventions: Adjustment, Application, Ordered   Post Interventions Patient Tolerated: Well Instructions Provided: Care of device, Adjustment of device   Janit Pagan 11/15/2018, 9:10 AM

## 2018-11-15 NOTE — Discharge Instructions (Signed)
DR. Deshana Rominger FOOT [facility] ANKLE SURGERY POST-OP INSTRUCTIONS   Pain Management 1. The numbing medicine and your leg will last around 18 hours, take a dose of your pain medicine as soon as you feel it wearing off to avoid rebound pain. 2. Keep your foot elevated above heart level.  Make sure that your heel hangs free ('floats'). 3. Take all prescribed medication as directed. 4. If taking narcotic pain medication you may want to use an over-the-counter stool softener to avoid constipation. 5. You may take over-the-counter NSAIDs (ibuprofen, naproxen, etc.) as well as over-the-counter acetaminophen as directed on the packaging as a supplement for your pain and may also use it to wean away from the prescription medication.  Activity ? Heel WB in post operative shoe. ? Keep dressing in place  First Postoperative Visit 1. Your first postop visit will be at least 2 weeks after surgery.  This should be scheduled when you schedule surgery. 2. If you do not have a postoperative visit scheduled please call [phone] to schedule an appointment. 3. At the appointment your incision will be evaluated for suture removal, x-rays will be obtained if necessary.  General Instructions 1. Swelling is very common after foot and ankle surgery.  It often takes 3 months for the foot and ankle to begin to feel comfortable.  Some amount of swelling will persist for 6-12 months. 2. DO NOT change the dressing.  If there is a problem with the dressing (too tight, loose, gets wet, etc.) please contact Dr. Declan Adamson's office. 3. DO NOT get the dressing wet.  For showers you can use an over-the-counter cast cover or wrap a washcloth around the top of your dressing and then cover it with a plastic bag and tape it to your leg. 4. DO NOT soak the incision (no tubs, pools, bath, etc.) until you have approval from Dr. Jarelis Ehlert.  Contact Dr. Adairs office or go to Emergency Room if: 1. Temperature above 101 F. 2. Increasing pain that is  unresponsive to pain medication or elevation 3. Excessive redness or swelling in your foot 4. Dressing problems - excessive bloody drainage, looseness or tightness, or if dressing gets wet 5. Develop pain, swelling, warmth, or discoloration of your calf  

## 2018-11-16 ENCOUNTER — Encounter (HOSPITAL_COMMUNITY): Payer: Self-pay | Admitting: Orthopaedic Surgery

## 2018-11-16 LAB — SURGICAL PATHOLOGY

## 2018-11-20 NOTE — Progress Notes (Deleted)
Office Visit Note  Patient: Shannon Jennings             Date of Birth: [birth year]           MRN: [id number]             PCP: Keane Police, MD Referring: Delanna Notice, MD Visit Date: 12/04/2018 Occupation: @GUAROCC @  Subjective:  No chief complaint on file.   History of Present Illness: Shannon Jennings is a 58 y.o. female ***   Activities of Daily Living:  Patient reports morning stiffness for *** {minute/hour:19697}.   Patient {ACTIONS;DENIES/REPORTS:21021675::"Denies"} nocturnal pain.  Difficulty dressing/grooming: {ACTIONS;DENIES/REPORTS:21021675::"Denies"} Difficulty climbing stairs: {ACTIONS;DENIES/REPORTS:21021675::"Denies"} Difficulty getting out of chair: {ACTIONS;DENIES/REPORTS:21021675::"Denies"} Difficulty using hands for taps, buttons, cutlery, and/or writing: {ACTIONS;DENIES/REPORTS:21021675::"Denies"}  No Rheumatology ROS completed.   PMFS History:  Patient Active Problem List   Diagnosis Date Noted  . Primary osteoarthritis of left knee 05/30/2016  . S/P total knee replacement using cement, right 04/12/2016  . Fibromyalgia 01/22/2016  . Primary osteoarthritis of both hands 01/22/2016  . Essential hypertension 01/22/2016  . History of coronary artery disease 01/22/2016  . History of Abbegail Matuska' disease 01/22/2016  . Dyslipidemia 01/22/2016  . History of thalassemia minor 01/22/2016    Past Medical History:  Diagnosis Date  . Abnormally small mouth   . Complication of anesthesia   . Coronary artery disease   . 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 Difficult intubation 07/2012  . Fibromyalgia   . Headache    migraines  . High cholesterol   . History of [name] disease   . History of kidney stones   . History of migraine   . Hypertension    under control with med., has been on med. x 4 yr.  . Hyperthyroidism   . Hypothyroidism   . Kidney stones   . Osteoarthritis   . S/P coronary artery stent placement 04/2011    states right coronary artery  . Thalassemia minor     Family History  Problem Relation Age of Onset  . Heart disease Mother   . Arthritis Mother   . Cancer Sister   . Seizures Sister   . Arthritis Brother   . Diabetes Brother    Past Surgical History:  Procedure Laterality Date  . ABDOMINAL HERNIA REPAIR  04/2012  . ABDOMINAL SURGERY  01/2011   for intestinal blockage  . APPENDECTOMY  1984  . CARDIAC CATHETERIZATION    . CHOLECYSTECTOMY  07/2012  . COLON SURGERY    . COLONOSCOPY    . CORONARY ANGIOPLASTY  04/2011   stent x 1  . DILATION AND CURETTAGE OF UTERUS     1989  . DISTAL INTERPHALANGEAL JOINT FUSION Bilateral 12/25/2012   Procedure: DISTAL INTERPHALANGEAL JOINT FUSION RIGHT AND LEFT SMALL FINGER;  Surgeon: Cammie Sickle., MD;  Location: [facility];  Service: Orthopedics;  Laterality: Bilateral;  left and right small finger  . ESOPHAGOGASTRODUODENOSCOPY    . EXCISION MASS LOWER EXTREMETIES Left 11/15/2018   Procedure: LEFT FOOT DEEP MASS EXCISION;  Surgeon: Erle Crocker, MD;  Location: [facility];  Service: Orthopedics;  Laterality: Left;  SURGERY REQUEST TIME 1.5 HOURS  . FINGER SURGERY    . HAND SURGERY  2014   Bilateral pinky finger distal joint fusion  . HAND SURGERY  2016   Right Thumb reconstruction and left middle finger distal joint fusion  . HAND SURGERY     Scar  tissue removal right palm  . HERNIA REPAIR    . JOINT REPLACEMENT     right kne total knee replacement  . KNEE ARTHROPLASTY    . OVARIAN CYST REMOVAL    . TOTAL KNEE ARTHROPLASTY Right 03/29/2016  . TOTAL KNEE ARTHROPLASTY Right 03/29/2016   Procedure: TOTAL KNEE ARTHROPLASTY;  Surgeon: Garald Balding, MD;  Location: [facility];  Service: Orthopedics;  Laterality: Right;  . TUBAL LIGATION Bilateral 1993  . WISDOM TOOTH EXTRACTION     Social History   Social History Narrative  . Not on file    There is no immunization history on file for this patient.   Objective:  Vital Signs: There were no vitals taken for this visit.   Physical Exam   Musculoskeletal Exam: ***  CDAI Exam: CDAI Score: - Patient Global: -; Provider Global: - Swollen: -; Tender: - Joint Exam   No joint exam has been documented for this visit   There is currently no information documented on the homunculus. Go to the Rheumatology activity and complete the homunculus joint exam.  Investigation: No additional findings.  Imaging: No results found.  Recent Labs: Lab Results  Component Value Date   WBC 6.2 11/13/2018   HGB 11.5 (L) 11/13/2018   PLT 207 11/13/2018   NA 141 11/13/2018   K 4.0 11/13/2018   CL 109 11/13/2018   CO2 23 11/13/2018   GLUCOSE 144 (H) 11/13/2018   BUN 14 11/13/2018   CREATININE 0.64 11/13/2018   BILITOT 0.8 10/05/2016   ALKPHOS 113 03/22/2016   AST 21 10/05/2016   ALT 29 10/05/2016   PROT 7.0 10/05/2016   ALBUMIN 4.4 03/22/2016   CALCIUM 9.8 11/13/2018   GFRAA >60 11/13/2018    Speciality Comments: No specialty comments available.  Procedures:  No procedures performed Allergies: Tizanidine, Toradol [ketorolac tromethamine], Dilaudid [hydromorphone hcl], Penicillins, and Sulfa antibiotics   Assessment / Plan:     Visit Diagnoses: No diagnosis found.  Orders: No orders of the defined types were placed in this encounter.  No orders of the defined types were placed in this encounter.   Face-to-face time spent with patient was *** minutes. Greater than 50% of time was spent in counseling and coordination of care.  Follow-Up Instructions: No follow-ups on file.   Earnestine Mealing, CMA  Note - This record has been created using Editor, commissioning.  Chart creation errors have been sought, but may not always  have been located. Such creation errors do not reflect on  the standard of medical care.

## 2018-12-04 ENCOUNTER — Ambulatory Visit: Payer: BLUE CROSS/BLUE SHIELD | Admitting: Physician Assistant

## 2019-01-23 ENCOUNTER — Ambulatory Visit: Payer: BC Managed Care – PPO | Admitting: Rheumatology

## 2019-03-07 ENCOUNTER — Ambulatory Visit: Payer: BC Managed Care – PPO | Admitting: Physician Assistant

## 2019-03-27 NOTE — Progress Notes (Deleted)
Office Visit Note  Patient: Shannon Jennings             Date of Birth: 1960/03/13           MRN: [id number]             PCP: Keane Police, MD Referring: Delanna Notice, MD Visit Date: 04/03/2019 Occupation: @GUAROCC @  Subjective:  No chief complaint on file.   History of Present Illness: Shannon Jennings is a 59 y.o. female ***   Activities of Daily Living:  Patient reports morning stiffness for *** {minute/hour:19697}.   Patient {ACTIONS;DENIES/REPORTS:21021675::"Denies"} nocturnal pain.  Difficulty dressing/grooming: {ACTIONS;DENIES/REPORTS:21021675::"Denies"} Difficulty climbing stairs: {ACTIONS;DENIES/REPORTS:21021675::"Denies"} Difficulty getting out of chair: {ACTIONS;DENIES/REPORTS:21021675::"Denies"} Difficulty using hands for taps, buttons, cutlery, and/or writing: {ACTIONS;DENIES/REPORTS:21021675::"Denies"}  No Rheumatology ROS completed.   PMFS History:  Patient Active Problem List   Diagnosis Date Noted  . Primary osteoarthritis of left knee 05/30/2016  . S/P total knee replacement using cement, right 04/12/2016  . Fibromyalgia 01/22/2016  . Primary osteoarthritis of both hands 01/22/2016  . Essential hypertension 01/22/2016  . History of coronary artery disease 01/22/2016  . History of [place] Clute' disease 01/22/2016  . Dyslipidemia 01/22/2016  . History of thalassemia minor 01/22/2016    Past Medical History:  Diagnosis Date  . Abnormally small mouth   . Complication of anesthesia   . Coronary artery disease   . 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 Difficult intubation 07/2012  . Fibromyalgia   . Headache    migraines  . High cholesterol   . History of Daimion Adamcik' disease   . History of kidney stones   . History of migraine   . Hypertension    under control with med., has been on med. x 4 yr.  . Hyperthyroidism   . Hypothyroidism   . Kidney stones   . Osteoarthritis   . S/P coronary artery stent placement 04/2011     states right coronary artery  . Thalassemia minor     Family History  Problem Relation Age of Onset  . Heart disease Mother   . Arthritis Mother   . Cancer Sister   . Seizures Sister   . Arthritis Brother   . Diabetes Brother    Past Surgical History:  Procedure Laterality Date  . ABDOMINAL HERNIA REPAIR  04/2012  . ABDOMINAL SURGERY  01/2011   for intestinal blockage  . APPENDECTOMY  1984  . CARDIAC CATHETERIZATION    . CHOLECYSTECTOMY  07/2012  . COLON SURGERY    . COLONOSCOPY    . CORONARY ANGIOPLASTY  04/2011   stent x 1  . DILATION AND CURETTAGE OF UTERUS     1989  . DISTAL INTERPHALANGEAL JOINT FUSION Bilateral 12/25/2012   Procedure: DISTAL INTERPHALANGEAL JOINT FUSION RIGHT AND LEFT SMALL FINGER;  Surgeon: Cammie Sickle., MD;  Location: [facility];  Service: Orthopedics;  Laterality: Bilateral;  left and right small finger  . ESOPHAGOGASTRODUODENOSCOPY    . EXCISION MASS LOWER EXTREMETIES Left 11/15/2018   Procedure: LEFT FOOT DEEP MASS EXCISION;  Surgeon: Erle Crocker, MD;  Location: [facility];  Service: Orthopedics;  Laterality: Left;  SURGERY REQUEST TIME 1.5 HOURS  . FINGER SURGERY    . HAND SURGERY  2014   Bilateral pinky finger distal joint fusion  . HAND SURGERY  2016   Right Thumb reconstruction and left middle finger distal joint fusion  . HAND SURGERY  Scar tissue removal right palm  . HERNIA REPAIR    . JOINT REPLACEMENT     right kne total knee replacement  . KNEE ARTHROPLASTY    . OVARIAN CYST REMOVAL    . TOTAL KNEE ARTHROPLASTY Right 03/29/2016  . TOTAL KNEE ARTHROPLASTY Right 03/29/2016   Procedure: TOTAL KNEE ARTHROPLASTY;  Surgeon: Garald Balding, MD;  Location: [facility];  Service: Orthopedics;  Laterality: Right;  . TUBAL LIGATION Bilateral 1993  . WISDOM TOOTH EXTRACTION     Social History   Social History Narrative  . Not on file    There is no immunization history on file for this patient.    Objective: Vital Signs: There were no vitals taken for this visit.   Physical Exam   Musculoskeletal Exam: ***  CDAI Exam: CDAI Score: -- Patient Global: --; Provider Global: -- Swollen: --; Tender: -- Joint Exam 04/03/2019   No joint exam has been documented for this visit   There is currently no information documented on the homunculus. Go to the Rheumatology activity and complete the homunculus joint exam.  Investigation: No additional findings.  Imaging: No results found.  Recent Labs: Lab Results  Component Value Date   WBC 6.2 11/13/2018   HGB 11.5 (L) 11/13/2018   PLT 207 11/13/2018   NA 141 11/13/2018   K 4.0 11/13/2018   CL 109 11/13/2018   CO2 23 11/13/2018   GLUCOSE 144 (H) 11/13/2018   BUN 14 11/13/2018   CREATININE 0.64 11/13/2018   BILITOT 0.8 10/05/2016   ALKPHOS 113 03/22/2016   AST 21 10/05/2016   ALT 29 10/05/2016   PROT 7.0 10/05/2016   ALBUMIN 4.4 03/22/2016   CALCIUM 9.8 11/13/2018   GFRAA >60 11/13/2018    Speciality Comments: No specialty comments available.  Procedures:  No procedures performed Allergies: Tizanidine, Toradol [ketorolac tromethamine], Dilaudid [hydromorphone hcl], Penicillins, and Sulfa antibiotics   Assessment / Plan:     Visit Diagnoses: No diagnosis found.  Orders: No orders of the defined types were placed in this encounter.  No orders of the defined types were placed in this encounter.   Face-to-face time spent with patient was *** minutes. Greater than 50% of time was spent in counseling and coordination of care.  Follow-Up Instructions: No follow-ups on file.   Earnestine Mealing, CMA  Note - This record has been created using Editor, commissioning.  Chart creation errors have been sought, but may not always  have been located. Such creation errors do not reflect on  the standard of medical care.

## 2019-04-03 ENCOUNTER — Ambulatory Visit: Payer: BC Managed Care – PPO | Admitting: Physician Assistant

## 2019-04-29 NOTE — Progress Notes (Deleted)
Office Visit Note  Patient: Shannon Jennings             Date of Birth: 19-Apr-1960           MRN: [id number]             PCP: Keane Police, MD Referring: Delanna Notice, MD Visit Date: 05/01/2019 Occupation: @GUAROCC @  Subjective:  No chief complaint on file.   History of Present Illness: Shannon Jennings is a 59 y.o. female ***   Activities of Daily Living:  Patient reports morning stiffness for *** {minute/hour:19697}.   Patient {ACTIONS;DENIES/REPORTS:21021675::"Denies"} nocturnal pain.  Difficulty dressing/grooming: {ACTIONS;DENIES/REPORTS:21021675::"Denies"} Difficulty climbing stairs: {ACTIONS;DENIES/REPORTS:21021675::"Denies"} Difficulty getting out of chair: {ACTIONS;DENIES/REPORTS:21021675::"Denies"} Difficulty using hands for taps, buttons, cutlery, and/or writing: {ACTIONS;DENIES/REPORTS:21021675::"Denies"}  No Rheumatology ROS completed.   PMFS History:  Patient Active Problem List   Diagnosis Date Noted  . Primary osteoarthritis of left knee 05/30/2016  . S/P total knee replacement using cement, right 04/12/2016  . Fibromyalgia 01/22/2016  . Primary osteoarthritis of both hands 01/22/2016  . Essential hypertension 01/22/2016  . History of coronary artery disease 01/22/2016  . History of Graves' disease 01/22/2016  . Dyslipidemia 01/22/2016  . History of thalassemia minor 01/22/2016    Past Medical History:  Diagnosis Date  . Abnormally small mouth   . Complication of anesthesia   . Coronary artery disease   . 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 Difficult intubation 07/2012  . Fibromyalgia   . Headache    migraines  . High cholesterol   . History of Graves' disease   . History of kidney stones   . History of migraine   . Hypertension    under control with med., has been on med. x 4 yr.  . Hyperthyroidism   . Hypothyroidism   . Kidney stones   . Osteoarthritis   . S/P coronary artery stent placement 04/2011     states right coronary artery  . Thalassemia minor     Family History  Problem Relation Age of Onset  . Heart disease Mother   . Arthritis Mother   . Cancer Sister   . Seizures Sister   . Arthritis Brother   . Diabetes Brother    Past Surgical History:  Procedure Laterality Date  . ABDOMINAL HERNIA REPAIR  04/2012  . ABDOMINAL SURGERY  01/2011   for intestinal blockage  . APPENDECTOMY  1984  . CARDIAC CATHETERIZATION    . CHOLECYSTECTOMY  07/2012  . COLON SURGERY    . COLONOSCOPY    . CORONARY ANGIOPLASTY  04/2011   stent x 1  . DILATION AND CURETTAGE OF UTERUS     1989  . DISTAL INTERPHALANGEAL JOINT FUSION Bilateral 12/25/2012   Procedure: DISTAL INTERPHALANGEAL JOINT FUSION RIGHT AND LEFT SMALL FINGER;  Surgeon: Cammie Sickle., MD;  Location: [facility];  Service: Orthopedics;  Laterality: Bilateral;  left and right small finger  . ESOPHAGOGASTRODUODENOSCOPY    . EXCISION MASS LOWER EXTREMETIES Left 11/15/2018   Procedure: LEFT FOOT DEEP MASS EXCISION;  Surgeon: Erle Crocker, MD;  Location: [facility];  Service: Orthopedics;  Laterality: Left;  SURGERY REQUEST TIME 1.5 HOURS  . FINGER SURGERY    . HAND SURGERY  2014   Bilateral pinky finger distal joint fusion  . HAND SURGERY  2016   Right Thumb reconstruction and left middle finger distal joint fusion  . HAND SURGERY  Scar tissue removal right palm  . HERNIA REPAIR    . JOINT REPLACEMENT     right kne total knee replacement  . KNEE ARTHROPLASTY    . OVARIAN CYST REMOVAL    . TOTAL KNEE ARTHROPLASTY Right 03/29/2016  . TOTAL KNEE ARTHROPLASTY Right 03/29/2016   Procedure: TOTAL KNEE ARTHROPLASTY;  Surgeon: Garald Balding, MD;  Location: [facility];  Service: Orthopedics;  Laterality: Right;  . TUBAL LIGATION Bilateral 1993  . WISDOM TOOTH EXTRACTION     Social History   Social History Narrative  . Not on file    There is no immunization history on file for this patient.    Objective: Vital Signs: There were no vitals taken for this visit.   Physical Exam   Musculoskeletal Exam: ***  CDAI Exam: CDAI Score: -- Patient Global: --; Provider Global: -- Swollen: --; Tender: -- Joint Exam 05/01/2019   No joint exam has been documented for this visit   There is currently no information documented on the homunculus. Go to the Rheumatology activity and complete the homunculus joint exam.  Investigation: No additional findings.  Imaging: No results found.  Recent Labs: Lab Results  Component Value Date   WBC 6.2 11/13/2018   HGB 11.5 (L) 11/13/2018   PLT 207 11/13/2018   NA 141 11/13/2018   K 4.0 11/13/2018   CL 109 11/13/2018   CO2 23 11/13/2018   GLUCOSE 144 (H) 11/13/2018   BUN 14 11/13/2018   CREATININE 0.64 11/13/2018   BILITOT 0.8 10/05/2016   ALKPHOS 113 03/22/2016   AST 21 10/05/2016   ALT 29 10/05/2016   PROT 7.0 10/05/2016   ALBUMIN 4.4 03/22/2016   CALCIUM 9.8 11/13/2018   GFRAA >60 11/13/2018    Speciality Comments: No specialty comments available.  Procedures:  No procedures performed Allergies: Tizanidine, Toradol [ketorolac tromethamine], Dilaudid [hydromorphone hcl], Penicillins, and Sulfa antibiotics   Assessment / Plan:     Visit Diagnoses: No diagnosis found.  Orders: No orders of the defined types were placed in this encounter.  No orders of the defined types were placed in this encounter.   Face-to-face time spent with patient was *** minutes. Greater than 50% of time was spent in counseling and coordination of care.  Follow-Up Instructions: No follow-ups on file.   Shannon Neas, PA-C  Note - This record has been created using Dragon software.  Chart creation errors have been sought, but may not always  have been located. Such creation errors do not reflect on  the standard of medical care.

## 2019-05-01 ENCOUNTER — Ambulatory Visit: Payer: BC Managed Care – PPO | Admitting: Physician Assistant

## 2019-07-05 NOTE — Progress Notes (Signed)
Office Visit Note  Patient: Shannon Jennings             Date of Birth: 17-Mar-1960           MRN: [id number]             PCP: Keane Police, MD Referring: Keane Police, MD Visit Date: 07/17/2019 Occupation: @GUAROCC @  Subjective:  Pes anserine bursitis bilaterally   History of Present Illness: Shannon Jennings is a 59 y.o. female with history of fibromyalgia and osteoarthritis.  Patient ports she continues to have intermittent myalgias and muscle tenderness due to fibromyalgia.  Overall her discomfort has been tolerable.  She continues to take tramadol and methocarbamol as needed for pain relief.  She has discontinued gabapentin, Ambien, and Flexeril.  She was started on Trokendi at bedtime, which she has found to be helpful.  She presents today with pes anserine bursitis.  She requested bilateral cortisone injections.  Activities of Daily Living:  Patient reports morning stiffness for 1  hour.   Patient Reports nocturnal pain.  Difficulty dressing/grooming: Denies Difficulty climbing stairs: Reports Difficulty getting out of chair: Denies Difficulty using hands for taps, buttons, cutlery, and/or writing: Denies  Review of Systems  Constitutional: Positive for fatigue.  HENT: Negative for mouth sores, mouth dryness and nose dryness.   Eyes: Negative for itching and dryness.  Respiratory: Negative for shortness of breath and difficulty breathing.   Cardiovascular: Positive for chest pain. Negative for palpitations.  Gastrointestinal: Negative for blood in stool, constipation and diarrhea.  Endocrine: Negative for increased urination.  Genitourinary: Negative for difficulty urinating.  Musculoskeletal: Positive for arthralgias, joint pain, myalgias, morning stiffness, muscle tenderness and myalgias. Negative for joint swelling.  Skin: Negative for color change, rash and redness.  Allergic/Immunologic: Negative for susceptible to infections.  Neurological: Negative for dizziness,  numbness, headaches, memory loss and weakness.  Hematological: Positive for bruising/bleeding tendency.  Psychiatric/Behavioral: Negative for confusion.    PMFS History:  Patient Active Problem List   Diagnosis Date Noted  . Primary osteoarthritis of left knee 05/30/2016  . S/P total knee replacement using cement, right 04/12/2016  . Fibromyalgia 01/22/2016  . Primary osteoarthritis of both hands 01/22/2016  . Essential hypertension 01/22/2016  . History of coronary artery disease 01/22/2016  . History of Graves' disease 01/22/2016  . Dyslipidemia 01/22/2016  . History of thalassemia minor 01/22/2016    Past Medical History:  Diagnosis Date  . Abnormally small mouth   . Complication of anesthesia   . Coronary artery disease   . 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 Difficult intubation 07/2012  . Fibromyalgia   . Headache    migraines  . High cholesterol   . History of Graves' disease   . History of kidney stones   . History of migraine   . Hypertension    under control with med., has been on med. x 4 yr.  . Hyperthyroidism   . Hypothyroidism   . Kidney stones   . Kidney stones    per patient   . Osteoarthritis   . S/P coronary artery stent placement 04/2011   states right coronary artery  . Thalassemia minor     Family History  Problem Relation Age of Onset  . Heart disease Mother   . Arthritis Mother   . Cancer Sister   . Seizures Sister   . Arthritis Brother   . Diabetes Brother    Past Surgical  History:  Procedure Laterality Date  . ABDOMINAL HERNIA REPAIR  04/2012  . ABDOMINAL SURGERY  01/2011   for intestinal blockage  . APPENDECTOMY  1984  . CARDIAC CATHETERIZATION    . CHOLECYSTECTOMY  07/2012  . COLON SURGERY    . COLONOSCOPY    . CORONARY ANGIOPLASTY  04/2011   stent x 1  . DILATION AND CURETTAGE OF UTERUS     1989  . DISTAL INTERPHALANGEAL JOINT FUSION Bilateral 12/25/2012   Procedure: DISTAL  INTERPHALANGEAL JOINT FUSION RIGHT AND LEFT SMALL FINGER;  Surgeon: Cammie Sickle., MD;  Location: [place];  Service: Orthopedics;  Laterality: Bilateral;  left and right small finger  . ESOPHAGOGASTRODUODENOSCOPY    . EXCISION MASS LOWER EXTREMETIES Left 11/15/2018   Procedure: LEFT FOOT DEEP MASS EXCISION;  Surgeon: Erle Crocker, MD;  Location: [place];  Service: Orthopedics;  Laterality: Left;  SURGERY REQUEST TIME 1.5 HOURS  . FINGER SURGERY    . HAND SURGERY  2014   Bilateral pinky finger distal joint fusion  . HAND SURGERY  2016   Right Thumb reconstruction and left middle finger distal joint fusion  . HAND SURGERY     Scar tissue removal right palm  . HERNIA REPAIR    . JOINT REPLACEMENT     right kne total knee replacement  . KNEE ARTHROPLASTY    . OVARIAN CYST REMOVAL    . TOTAL KNEE ARTHROPLASTY Right 03/29/2016  . TOTAL KNEE ARTHROPLASTY Right 03/29/2016   Procedure: TOTAL KNEE ARTHROPLASTY;  Surgeon: Garald Balding, MD;  Location: [place];  Service: Orthopedics;  Laterality: Right;  . TUBAL LIGATION Bilateral 1993  . WISDOM TOOTH EXTRACTION     Social History   Social History Narrative  . Not on file    There is no immunization history on file for this patient.   Objective: Vital Signs: BP 98/62 (BP Location: Left Arm, Patient Position: Sitting, Cuff Size: Normal)   Pulse 75   Resp 16   Ht 5\' 3"  (1.6 m)   Wt 153 lb 9.6 oz (69.7 kg)   BMI 27.21 kg/m    Physical Exam Vitals and nursing note reviewed.  Constitutional:      Appearance: She is well-developed.  HENT:     Head: Normocephalic and atraumatic.  Eyes:     Conjunctiva/sclera: Conjunctivae normal.  Pulmonary:     Effort: Pulmonary effort is normal.  Abdominal:     General: Bowel sounds are normal.     Palpations: Abdomen is soft.  Musculoskeletal:     Cervical back: Normal range of motion.  Lymphadenopathy:     Cervical: No cervical adenopathy.  Skin:    General:  Skin is warm and dry.     Capillary Refill: Capillary refill takes less than 2 seconds.  Neurological:     Mental Status: She is alert and oriented to person, place, and time.  Psychiatric:        Behavior: Behavior normal.      Musculoskeletal Exam: C-spine, thoracic spine, lumbar spine have good range of motion.  Shoulder joints, elbow joints, wrist joints good ROM. Surgical fusion of PIP joints.  No tenderness or synovitis of MCP joints.  Hip joints have good range of motion with no discomfort.  Pes anserine noted bilaterally.  Right knee replacement is warm.  Ankle joints have good range of motion with no tenderness or inflammation.  CDAI Exam: CDAI Score: -- Patient Global: --; Provider Global: --  Swollen: --; Tender: -- Joint Exam 07/17/2019   No joint exam has been documented for this visit   There is currently no information documented on the homunculus. Go to the Rheumatology activity and complete the homunculus joint exam.  Investigation: No additional findings.  Imaging: No results found.  Recent Labs: Lab Results  Component Value Date   WBC 6.2 11/13/2018   HGB 11.5 (L) 11/13/2018   PLT 207 11/13/2018   NA 141 11/13/2018   K 4.0 11/13/2018   CL 109 11/13/2018   CO2 23 11/13/2018   GLUCOSE 144 (H) 11/13/2018   BUN 14 11/13/2018   CREATININE 0.64 11/13/2018   BILITOT 0.8 10/05/2016   ALKPHOS 113 03/22/2016   AST 21 10/05/2016   ALT 29 10/05/2016   PROT 7.0 10/05/2016   ALBUMIN 4.4 03/22/2016   CALCIUM 9.8 11/13/2018   GFRAA >60 11/13/2018    Speciality Comments: No specialty comments available.  Procedures:  Large Joint Inj: bilateral knee (Anserine bursitis) on 07/17/2019 4:28 PM Indications: pain Details: 27 G 1.5 in needle, medial approach  Arthrogram: No  Medications (Right): 0.5 mL lidocaine 1 %; 30 mg triamcinolone acetonide 40 MG/ML Medications (Left): 0.5 mL lidocaine 1 %; 30 mg triamcinolone acetonide 40 MG/ML Outcome: tolerated well, no  immediate complications Procedure, treatment alternatives, risks and benefits explained, specific risks discussed. Consent was given by the patient. Immediately prior to procedure a time out was called to verify the correct patient, procedure, equipment, support staff and site/side marked as required. Patient was prepped and draped in the usual sterile fashion.     Allergies: Tizanidine, Toradol [ketorolac tromethamine], Dilaudid [hydromorphone hcl], Penicillins, and Sulfa antibiotics   Assessment / Plan:     Visit Diagnoses: Fibromyalgia -she experiences intermittent myalgias and muscle tenderness due to underlying fibromyalgia.  She takes Robaxin 500 mg twice daily as needed for muscle spasms and tramadol 50 mg 2 times daily as needed for pain relief.  According to the patient she discontinued Flexeril, gabapentin, and Ambien.  She was started on Trokendi which has provided significant relief.  She has been sleeping better at night.  We discussed the importance of regular exercise and good sleep hygiene.  She will follow-up in the office in 6 months  Primary osteoarthritis of both hands: She has surgical fusion of the PIP joints.  No tenderness or inflammation was noted on exam today.  She is not having any discomfort in her hands at this time.  Joint protection and muscle strengthening were discussed.  Pes anserine bursitis: She presents today with pes anserine bursitis bilaterally.  She has tenderness to palpation on exam.  She had cortisone injections performed bilaterally on 07/12/2018 which provided significant relief.  She requested repeat cortisone injections today.  She tolerated the procedure well.  The procedure note was completed above.  Aftercare was discussed.  Primary osteoarthritis of left knee: She has good range of motion with no discomfort.  No warmth or effusion was noted.  S/P total knee replacement using cement, right: Doing well.  She has good range of motion with no discomfort.   Warmth was noted on exam.  She has pes anserine bursitis and requested a cortisone injection today.  Her last injection was performed on 07/12/2018 which provided significant relief for several months.    Other medical conditions are listed as follows:  History of thalassemia minor  Essential hypertension  History of coronary artery disease  Dyslipidemia  History of Graves' disease  Kidney stone  Orders:  Orders Placed This Encounter  Procedures  . Large Joint Inj   No orders of the defined types were placed in this encounter.    Follow-Up Instructions: Return in about 6 months (around 01/17/2020) for Fibromyalgia, Osteoarthritis.   Ofilia Neas, PA-C   I examined and evaluated the patient with Hazel Sams PA.  Patient presented with bilateral anserine bursitis.  Patient per patient's request bilateral anserine bursae were injected with cortisone as described above.  She tolerated the procedure well.  Postprocedure instructions were given.  The plan of care was discussed as noted above.  Bo Merino, MD  Note - This record has been created using Editor, commissioning.  Chart creation errors have been sought, but may not always  have been located. Such creation errors do not reflect on  the standard of medical care.

## 2019-07-17 ENCOUNTER — Other Ambulatory Visit: Payer: Self-pay

## 2019-07-17 ENCOUNTER — Encounter: Payer: Self-pay | Admitting: Physician Assistant

## 2019-07-17 ENCOUNTER — Ambulatory Visit: Payer: BC Managed Care – PPO | Admitting: Physician Assistant

## 2019-07-17 VITALS — BP 98/62 | HR 75 | Resp 16 | Ht 63.0 in | Wt 153.6 lb

## 2019-07-17 DIAGNOSIS — M7051 Other bursitis of knee, right knee: Secondary | ICD-10-CM | POA: Diagnosis not present

## 2019-07-17 DIAGNOSIS — M19041 Primary osteoarthritis, right hand: Secondary | ICD-10-CM

## 2019-07-17 DIAGNOSIS — N200 Calculus of kidney: Secondary | ICD-10-CM

## 2019-07-17 DIAGNOSIS — M1712 Unilateral primary osteoarthritis, left knee: Secondary | ICD-10-CM | POA: Diagnosis not present

## 2019-07-17 DIAGNOSIS — E785 Hyperlipidemia, unspecified: Secondary | ICD-10-CM

## 2019-07-17 DIAGNOSIS — Z862 Personal history of diseases of the blood and blood-forming organs and certain disorders involving the immune mechanism: Secondary | ICD-10-CM

## 2019-07-17 DIAGNOSIS — Z8639 Personal history of other endocrine, nutritional and metabolic disease: Secondary | ICD-10-CM

## 2019-07-17 DIAGNOSIS — M19042 Primary osteoarthritis, left hand: Secondary | ICD-10-CM

## 2019-07-17 DIAGNOSIS — I10 Essential (primary) hypertension: Secondary | ICD-10-CM

## 2019-07-17 DIAGNOSIS — M7050 Other bursitis of knee, unspecified knee: Secondary | ICD-10-CM

## 2019-07-17 DIAGNOSIS — M797 Fibromyalgia: Secondary | ICD-10-CM

## 2019-07-17 DIAGNOSIS — Z8679 Personal history of other diseases of the circulatory system: Secondary | ICD-10-CM

## 2019-07-17 DIAGNOSIS — Z96651 Presence of right artificial knee joint: Secondary | ICD-10-CM

## 2019-07-17 MED ORDER — TRIAMCINOLONE ACETONIDE 40 MG/ML IJ SUSP
30.0000 mg | INTRAMUSCULAR | Status: AC | PRN
  Administered 2019-07-17: 30 mg via INTRA_ARTICULAR

## 2019-07-17 MED ORDER — LIDOCAINE HCL 1 % IJ SOLN
0.5000 mL | INTRAMUSCULAR | Status: AC | PRN
  Administered 2019-07-17: .5 mL

## 2020-01-01 NOTE — Progress Notes (Deleted)
Office Visit Note  Patient: Shannon Jennings             Date of Birth: 1960/02/23           MRN: [id number]             PCP: Glori Bickers, MD Referring: Glori Bickers, MD Visit Date: 01/14/2020 Occupation: @GUAROCC @  Subjective:  No chief complaint on file.   History of Present Illness: Shannon Jennings is a 59 y.o. female ***   Activities of Daily Living:  Patient reports morning stiffness for *** {minute/hour:19697}.   Patient {ACTIONS;DENIES/REPORTS:21021675::"Denies"} nocturnal pain.  Difficulty dressing/grooming: {ACTIONS;DENIES/REPORTS:21021675::"Denies"} Difficulty climbing stairs: {ACTIONS;DENIES/REPORTS:21021675::"Denies"} Difficulty getting out of chair: {ACTIONS;DENIES/REPORTS:21021675::"Denies"} Difficulty using hands for taps, buttons, cutlery, and/or writing: {ACTIONS;DENIES/REPORTS:21021675::"Denies"}  No Rheumatology ROS completed.   PMFS History:  Patient Active Problem List   Diagnosis Date Noted  . Primary osteoarthritis of left knee 05/30/2016  . S/P total knee replacement using cement, right 04/12/2016  . Fibromyalgia 01/22/2016  . Primary osteoarthritis of both hands 01/22/2016  . Essential hypertension 01/22/2016  . History of coronary artery disease 01/22/2016  . History of Cardin Nitschke' disease 01/22/2016  . Dyslipidemia 01/22/2016  . History of thalassemia minor 01/22/2016    Past Medical History:  Diagnosis Date  . Abnormally small mouth   . Complication of anesthesia   . Coronary artery disease   . 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 Difficult intubation 07/2012  . Fibromyalgia   . Headache    migraines  . High cholesterol   . History of [place] Bar[place]' disease   . History of kidney stones   . History of migraine   . Hypertension    under control with med., has been on med. x 4 yr.  . Hyperthyroidism   . Hypothyroidism   . Kidney stones   . Kidney stones    per patient   . Osteoarthritis   . S/P  coronary artery stent placement 04/2011   states right coronary artery  . Thalassemia minor     Family History  Problem Relation Age of Onset  . Heart disease Mother   . Arthritis Mother   . Cancer Sister   . Seizures Sister   . Arthritis Brother   . Diabetes Brother    Past Surgical History:  Procedure Laterality Date  . ABDOMINAL HERNIA REPAIR  04/2012  . ABDOMINAL SURGERY  01/2011   for intestinal blockage  . APPENDECTOMY  1984  . CARDIAC CATHETERIZATION    . CHOLECYSTECTOMY  07/2012  . COLON SURGERY    . COLONOSCOPY    . CORONARY ANGIOPLASTY  04/2011   stent x 1  . DILATION AND CURETTAGE OF UTERUS     1989  . DISTAL INTERPHALANGEAL JOINT FUSION Bilateral 12/25/2012   Procedure: DISTAL INTERPHALANGEAL JOINT FUSION RIGHT AND LEFT SMALL FINGER;  Surgeon: 12/27/2012., MD;  Location: [facility];  Service: Orthopedics;  Laterality: Bilateral;  left and right small finger  . ESOPHAGOGASTRODUODENOSCOPY    . EXCISION MASS LOWER EXTREMETIES Left 11/15/2018   Procedure: LEFT FOOT DEEP MASS EXCISION;  Surgeon: 13/12/2018, MD;  Location: [facility];  Service: Orthopedics;  Laterality: Left;  SURGERY REQUEST TIME 1.5 HOURS  . FINGER SURGERY    . HAND SURGERY  2014   Bilateral pinky finger distal joint fusion  . HAND SURGERY  2016   Right Thumb reconstruction and left middle finger distal joint  fusion  . HAND SURGERY     Scar tissue removal right palm  . HERNIA REPAIR    . JOINT REPLACEMENT     right kne total knee replacement  . KNEE ARTHROPLASTY    . OVARIAN CYST REMOVAL    . TOTAL KNEE ARTHROPLASTY Right 03/29/2016  . TOTAL KNEE ARTHROPLASTY Right 03/29/2016   Procedure: TOTAL KNEE ARTHROPLASTY;  Surgeon: Garald Balding, MD;  Location: [place];  Service: Orthopedics;  Laterality: Right;  . TUBAL LIGATION Bilateral 1993  . WISDOM TOOTH EXTRACTION     Social History   Social History Narrative  . Not on file    There is no immunization history on  file for this patient.   Objective: Vital Signs: There were no vitals taken for this visit.   Physical Exam   Musculoskeletal Exam: ***  CDAI Exam: CDAI Score: -- Patient Global: --; Provider Global: -- Swollen: --; Tender: -- Joint Exam 01/14/2020   No joint exam has been documented for this visit   There is currently no information documented on the homunculus. Go to the Rheumatology activity and complete the homunculus joint exam.  Investigation: No additional findings.  Imaging: No results found.  Recent Labs: Lab Results  Component Value Date   WBC 6.2 11/13/2018   HGB 11.5 (L) 11/13/2018   PLT 207 11/13/2018   NA 141 11/13/2018   K 4.0 11/13/2018   CL 109 11/13/2018   CO2 23 11/13/2018   GLUCOSE 144 (H) 11/13/2018   BUN 14 11/13/2018   CREATININE 0.64 11/13/2018   BILITOT 0.8 10/05/2016   ALKPHOS 113 03/22/2016   AST 21 10/05/2016   ALT 29 10/05/2016   PROT 7.0 10/05/2016   ALBUMIN 4.4 03/22/2016   CALCIUM 9.8 11/13/2018   GFRAA >60 11/13/2018    Speciality Comments: No specialty comments available.  Procedures:  No procedures performed Allergies: Tizanidine, Toradol [ketorolac tromethamine], Dilaudid [hydromorphone hcl], Penicillins, and Sulfa antibiotics   Assessment / Plan:     Visit Diagnoses: No diagnosis found.  Orders: No orders of the defined types were placed in this encounter.  No orders of the defined types were placed in this encounter.   Face-to-face time spent with patient was *** minutes. Greater than 50% of time was spent in counseling and coordination of care.  Follow-Up Instructions: No follow-ups on file.   Earnestine Mealing, CMA  Note - This record has been created using Editor, commissioning.  Chart creation errors have been sought, but may not always  have been located. Such creation errors do not reflect on  the standard of medical care.

## 2020-01-09 NOTE — Progress Notes (Deleted)
Office Visit Note  Patient: Shannon Jennings             Date of Birth: [birth year]           MRN: [id number]             PCP: Keane Police, MD Referring: Keane Police, MD Visit Date: 01/20/2020 Occupation: @GUAROCC @  Subjective:  No chief complaint on file.   History of Present Illness: Shannon Jennings is a 60 y.o. female ***   Activities of Daily Living:  Patient reports morning stiffness for *** {minute/hour:19697}.   Patient {ACTIONS;DENIES/REPORTS:21021675::"Denies"} nocturnal pain.  Difficulty dressing/grooming: {ACTIONS;DENIES/REPORTS:21021675::"Denies"} Difficulty climbing stairs: {ACTIONS;DENIES/REPORTS:21021675::"Denies"} Difficulty getting out of chair: {ACTIONS;DENIES/REPORTS:21021675::"Denies"} Difficulty using hands for taps, buttons, cutlery, and/or writing: {ACTIONS;DENIES/REPORTS:21021675::"Denies"}  No Rheumatology ROS completed.   PMFS History:  Patient Active Problem List   Diagnosis Date Noted  . Primary osteoarthritis of left knee 05/30/2016  . S/P total knee replacement using cement, right 04/12/2016  . Fibromyalgia 01/22/2016  . Primary osteoarthritis of both hands 01/22/2016  . Essential hypertension 01/22/2016  . History of coronary artery disease 01/22/2016  . History of Graves' disease 01/22/2016  . Dyslipidemia 01/22/2016  . History of thalassemia minor 01/22/2016    Past Medical History:  Diagnosis Date  . Abnormally small mouth   . Complication of anesthesia   . Coronary artery disease   . 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 Difficult intubation 07/2012  . Fibromyalgia   . Headache    migraines  . High cholesterol   . History of Graves' disease   . History of kidney stones   . History of migraine   . Hypertension    under control with med., has been on med. x 4 yr.  . Hyperthyroidism   . Hypothyroidism   . Kidney stones   . Kidney stones    per patient   . Osteoarthritis   . S/P  coronary artery stent placement 04/2011   states right coronary artery  . Thalassemia minor     Family History  Problem Relation Age of Onset  . Heart disease Mother   . Arthritis Mother   . Cancer Sister   . Seizures Sister   . Arthritis Brother   . Diabetes Brother    Past Surgical History:  Procedure Laterality Date  . ABDOMINAL HERNIA REPAIR  04/2012  . ABDOMINAL SURGERY  01/2011   for intestinal blockage  . APPENDECTOMY  1984  . CARDIAC CATHETERIZATION    . CHOLECYSTECTOMY  07/2012  . COLON SURGERY    . COLONOSCOPY    . CORONARY ANGIOPLASTY  04/2011   stent x 1  . DILATION AND CURETTAGE OF UTERUS     1989  . DISTAL INTERPHALANGEAL JOINT FUSION Bilateral 12/25/2012   Procedure: DISTAL INTERPHALANGEAL JOINT FUSION RIGHT AND LEFT SMALL FINGER;  Surgeon: Cammie Sickle., MD;  Location: [place];  Service: Orthopedics;  Laterality: Bilateral;  left and right small finger  . ESOPHAGOGASTRODUODENOSCOPY    . EXCISION MASS LOWER EXTREMETIES Left 11/15/2018   Procedure: LEFT FOOT DEEP MASS EXCISION;  Surgeon: Erle Crocker, MD;  Location: [place];  Service: Orthopedics;  Laterality: Left;  SURGERY REQUEST TIME 1.5 HOURS  . FINGER SURGERY    . HAND SURGERY  2014   Bilateral pinky finger distal joint fusion  . HAND SURGERY  2016   Right Thumb reconstruction and left middle finger distal joint  fusion  . HAND SURGERY     Scar tissue removal right palm  . HERNIA REPAIR    . JOINT REPLACEMENT     right kne total knee replacement  . KNEE ARTHROPLASTY    . OVARIAN CYST REMOVAL    . TOTAL KNEE ARTHROPLASTY Right 03/29/2016  . TOTAL KNEE ARTHROPLASTY Right 03/29/2016   Procedure: TOTAL KNEE ARTHROPLASTY;  Surgeon: Valeria Batman, MD;  Location[facility];  Service: Orthopedics;  Laterality: Right;  . TUBAL LIGATION Bilateral 1993  . WISDOM TOOTH EXTRACTION     Social History   Social History Narrative  . Not on file    There is no immunization history on  file for this patient.   Objective: Vital Signs: There were no vitals taken for this visit.   Physical Exam   Musculoskeletal Exam: ***  CDAI Exam: CDAI Score: - Patient Global: -; Provider Global: - Swollen: -; Tender: - Joint Exam 01/20/2020   No joint exam has been documented for this visit   There is currently no information documented on the homunculus. Go to the Rheumatology activity and complete the homunculus joint exam.  Investigation: No additional findings.  Imaging: No results found.  Recent Labs: Lab Results  Component Value Date   WBC 6.2 11/13/2018   HGB 11.5 (L) 11/13/2018   PLT 207 11/13/2018   NA 141 11/13/2018   K 4.0 11/13/2018   CL 109 11/13/2018   CO2 23 11/13/2018   GLUCOSE 144 (H) 11/13/2018   BUN 14 11/13/2018   CREATININE 0.64 11/13/2018   BILITOT 0.8 10/05/2016   ALKPHOS 113 03/22/2016   AST 21 10/05/2016   ALT 29 10/05/2016   PROT 7.0 10/05/2016   ALBUMIN 4.4 03/22/2016   CALCIUM 9.8 11/13/2018   GFRAA >60 11/13/2018    Speciality Comments: No specialty comments available.  Procedures:  No procedures performed Allergies: Tizanidine, Toradol [ketorolac tromethamine], Dilaudid [hydromorphone hcl], Penicillins, and Sulfa antibiotics   Assessment / Plan:     Visit Diagnoses: Fibromyalgia  Primary osteoarthritis of both hands  Pes anserine bursitis  Primary osteoarthritis of left knee  S/P total knee replacement using cement, right  History of thalassemia minor  Essential hypertension  History of coronary artery disease  Dyslipidemia  History of Graves' disease  Kidney stone  Orders: No orders of the defined types were placed in this encounter.  No orders of the defined types were placed in this encounter.   Face-to-face time spent with patient was *** minutes. Greater than 50% of time was spent in counseling and coordination of care.  Follow-Up Instructions: No follow-ups on file.   Gearldine Bienenstock,  PA-C  Note - This record has been created using Dragon software.  Chart creation errors have been sought, but may not always  have been located. Such creation errors do not reflect on  the standard of medical care.

## 2020-01-14 ENCOUNTER — Ambulatory Visit: Payer: BC Managed Care – PPO | Admitting: Physician Assistant

## 2020-01-20 ENCOUNTER — Ambulatory Visit: Payer: BC Managed Care – PPO | Admitting: Physician Assistant

## 2020-01-20 DIAGNOSIS — Z96651 Presence of right artificial knee joint: Secondary | ICD-10-CM

## 2020-01-20 DIAGNOSIS — M1712 Unilateral primary osteoarthritis, left knee: Secondary | ICD-10-CM

## 2020-01-20 DIAGNOSIS — I10 Essential (primary) hypertension: Secondary | ICD-10-CM

## 2020-01-20 DIAGNOSIS — N200 Calculus of kidney: Secondary | ICD-10-CM

## 2020-01-20 DIAGNOSIS — M19041 Primary osteoarthritis, right hand: Secondary | ICD-10-CM

## 2020-01-20 DIAGNOSIS — Z8679 Personal history of other diseases of the circulatory system: Secondary | ICD-10-CM

## 2020-01-20 DIAGNOSIS — M7050 Other bursitis of knee, unspecified knee: Secondary | ICD-10-CM

## 2020-01-20 DIAGNOSIS — E785 Hyperlipidemia, unspecified: Secondary | ICD-10-CM

## 2020-01-20 DIAGNOSIS — Z862 Personal history of diseases of the blood and blood-forming organs and certain disorders involving the immune mechanism: Secondary | ICD-10-CM

## 2020-01-20 DIAGNOSIS — M797 Fibromyalgia: Secondary | ICD-10-CM

## 2020-01-20 DIAGNOSIS — Z8639 Personal history of other endocrine, nutritional and metabolic disease: Secondary | ICD-10-CM

## 2020-05-03 HISTORY — PX: CATARACT EXTRACTION W/ INTRAOCULAR LENS IMPLANT: SHX1309

## 2020-07-03 ENCOUNTER — Encounter: Payer: Self-pay | Admitting: Rheumatology

## 2020-07-03 ENCOUNTER — Ambulatory Visit (INDEPENDENT_AMBULATORY_CARE_PROVIDER_SITE_OTHER): Payer: BC Managed Care – PPO | Admitting: Rheumatology

## 2020-07-03 ENCOUNTER — Other Ambulatory Visit: Payer: Self-pay

## 2020-07-03 VITALS — BP 145/83 | HR 67 | Resp 14 | Ht 63.0 in | Wt 155.8 lb

## 2020-07-03 DIAGNOSIS — M1712 Unilateral primary osteoarthritis, left knee: Secondary | ICD-10-CM

## 2020-07-03 DIAGNOSIS — Z8679 Personal history of other diseases of the circulatory system: Secondary | ICD-10-CM

## 2020-07-03 DIAGNOSIS — M7050 Other bursitis of knee, unspecified knee: Secondary | ICD-10-CM | POA: Diagnosis not present

## 2020-07-03 DIAGNOSIS — Z96651 Presence of right artificial knee joint: Secondary | ICD-10-CM

## 2020-07-03 DIAGNOSIS — E785 Hyperlipidemia, unspecified: Secondary | ICD-10-CM

## 2020-07-03 DIAGNOSIS — M19041 Primary osteoarthritis, right hand: Secondary | ICD-10-CM

## 2020-07-03 DIAGNOSIS — Z862 Personal history of diseases of the blood and blood-forming organs and certain disorders involving the immune mechanism: Secondary | ICD-10-CM

## 2020-07-03 DIAGNOSIS — M797 Fibromyalgia: Secondary | ICD-10-CM

## 2020-07-03 DIAGNOSIS — M170 Bilateral primary osteoarthritis of knee: Secondary | ICD-10-CM | POA: Diagnosis not present

## 2020-07-03 DIAGNOSIS — Z8639 Personal history of other endocrine, nutritional and metabolic disease: Secondary | ICD-10-CM

## 2020-07-03 DIAGNOSIS — M19071 Primary osteoarthritis, right ankle and foot: Secondary | ICD-10-CM

## 2020-07-03 DIAGNOSIS — U071 COVID-19: Secondary | ICD-10-CM

## 2020-07-03 DIAGNOSIS — I10 Essential (primary) hypertension: Secondary | ICD-10-CM

## 2020-07-03 DIAGNOSIS — M19042 Primary osteoarthritis, left hand: Secondary | ICD-10-CM

## 2020-07-03 DIAGNOSIS — M19072 Primary osteoarthritis, left ankle and foot: Secondary | ICD-10-CM

## 2020-07-03 DIAGNOSIS — N200 Calculus of kidney: Secondary | ICD-10-CM

## 2020-07-03 MED ORDER — TRIAMCINOLONE ACETONIDE 40 MG/ML IJ SUSP
30.0000 mg | INTRAMUSCULAR | Status: AC | PRN
  Administered 2020-07-03: 30 mg via INTRA_ARTICULAR

## 2020-07-03 MED ORDER — LIDOCAINE HCL 1 % IJ SOLN
1.0000 mL | INTRAMUSCULAR | Status: AC | PRN
  Administered 2020-07-03: 1 mL

## 2020-07-03 NOTE — Progress Notes (Signed)
Office Visit Note  Patient: Shannon Jennings             Date of Birth: October 26, 1960           MRN: [id number]             PCP: Keane Police, MD Referring: Keane Police, MD Visit Date: 07/03/2020 Occupation: @GUAROCC @  Subjective:  Other (Bilateral hip pain- patient requested injections. Patient also reports bilateral foot/toe pain )   History of Present Illness: Shannon Jennings is a 60 y.o. female with history of osteoarthritis.  She states she had flu and then COVID-19 virus infection in January 2022.  She also lost her sister recently.  She has been under a lot of stress.  She continues to have stiffness in her hands.  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  Her knee joints and feet hurt after that.  She has had recurrence of right anserine bursitis which is causing a lot of discomfort.  She would like to have a cortisone injection.  Activities of Daily Living:  Patient reports morning stiffness for 30 minutes.   Patient Reports nocturnal pain.  Difficulty dressing/grooming: Denies Difficulty climbing stairs: Reports Difficulty getting out of chair: Denies Difficulty using hands for taps, buttons, cutlery, and/or writing: Denies  Review of Systems  Constitutional:  Positive for fatigue.  HENT:  Negative for mouth sores, mouth dryness and nose dryness.   Eyes:  Negative for pain, itching and dryness.  Respiratory:  Negative for shortness of breath and difficulty breathing.   Cardiovascular:  Negative for chest pain and palpitations.  Gastrointestinal:  Negative for blood in stool, constipation and diarrhea.  Endocrine: Negative for increased urination.  Genitourinary:  Negative for difficulty urinating.  Musculoskeletal:  Positive for joint pain, joint pain, myalgias, morning stiffness and myalgias. Negative for joint swelling and muscle tenderness.  Skin:  Negative for color change, rash and redness.  Allergic/Immunologic: Negative for  susceptible to infections.  Neurological:  Negative for dizziness, numbness, headaches, memory loss and weakness.  Hematological:  Positive for bruising/bleeding tendency.  Psychiatric/Behavioral:  Negative for confusion.    PMFS History:  Patient Active Problem List   Diagnosis Date Noted   Primary osteoarthritis of left knee 05/30/2016   S/P total knee replacement using cement, right 04/12/2016   Fibromyalgia 01/22/2016   Primary osteoarthritis of both hands 01/22/2016   Essential hypertension 01/22/2016   History of coronary artery disease 01/22/2016   History of Graves' disease 01/22/2016   Dyslipidemia 01/22/2016   History of thalassemia minor 01/22/2016    Past Medical History:  Diagnosis Date   Abnormally small mouth    Complication of anesthesia    Coronary artery disease    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 x 4 yr.   Hyperthyroidism    Hypothyroidism    Kidney stones    Kidney stones    per patient    Osteoarthritis    S/P coronary artery stent placement 04/2011   states right coronary artery   Thalassemia minor     Family History  Problem  Relation Age of Onset   Heart disease Mother    Arthritis Mother    Cancer Sister    Seizures Sister    Arthritis Brother    Diabetes Brother    Past Surgical History:  Procedure Laterality Date   ABDOMINAL HERNIA REPAIR  04/2012   ABDOMINAL SURGERY  01/2011   for intestinal blockage   APPENDECTOMY  1984   CARDIAC CATHETERIZATION     CATARACT EXTRACTION W/ INTRAOCULAR LENS IMPLANT Left 05/2020   CHOLECYSTECTOMY  07/2012   COLON SURGERY     COLONOSCOPY     CORONARY ANGIOPLASTY  04/2011   stent x 1   DILATION AND CURETTAGE OF UTERUS     1989    DISTAL INTERPHALANGEAL JOINT FUSION Bilateral 12/25/2012   Procedure: DISTAL INTERPHALANGEAL JOINT FUSION RIGHT AND LEFT SMALL FINGER;  Surgeon: Cammie Sickle., MD;  Location: [place];  Service: Orthopedics;  Laterality: Bilateral;  left and right small finger   ESOPHAGOGASTRODUODENOSCOPY     EXCISION MASS LOWER EXTREMETIES Left 11/15/2018   Procedure: LEFT FOOT DEEP MASS EXCISION;  Surgeon: Erle Crocker, MD;  Location: [place];  Service: Orthopedics;  Laterality: Left;  SURGERY REQUEST TIME 1.5 HOURS   FINGER SURGERY     HAND SURGERY  2014   Bilateral pinky finger distal joint fusion   HAND SURGERY  2016   Right Thumb reconstruction and left middle finger distal joint fusion   HAND SURGERY     Scar tissue removal right palm   HERNIA REPAIR     JOINT REPLACEMENT     right kne total knee replacement   KNEE ARTHROPLASTY     OVARIAN CYST REMOVAL     TOTAL KNEE ARTHROPLASTY Right 03/29/2016   TOTAL KNEE ARTHROPLASTY Right 03/29/2016   Procedure: TOTAL KNEE ARTHROPLASTY;  Surgeon: Garald Balding, MD;  Location: [place];  Service: Orthopedics;  Laterality: Right;   TUBAL LIGATION Bilateral 1993   WISDOM TOOTH EXTRACTION     Social History   Social History Narrative   Not on file   Immunization History  Administered Date(s) Administered   Moderna Sars-Covid-2 Vaccination 02/13/2019, 03/29/2019     Objective: Vital Signs: BP (!) 145/83 (BP Location: Left Arm, Patient Position: Sitting, Cuff Size: Normal)   Pulse 67   Resp 14   Ht 5\' 3"  (1.6 m)   Wt 155 lb 12.8 oz (70.7 kg)   BMI 27.60 kg/m    Physical Exam Vitals and nursing note reviewed.  Constitutional:      Appearance: She is well-developed.  HENT:     Head: Normocephalic and atraumatic.  Eyes:     Conjunctiva/sclera: Conjunctivae normal.  Cardiovascular:     Rate and Rhythm: Normal rate and regular rhythm.     Heart sounds: Normal heart sounds.  Pulmonary:     Effort: Pulmonary  effort is normal.     Breath sounds: Normal breath sounds.  Abdominal:     General: Bowel sounds are normal.     Palpations: Abdomen is soft.  Musculoskeletal:     Cervical back: Normal range of motion.  Lymphadenopathy:     Cervical: No cervical adenopathy.  Skin:    General: Skin is warm and dry.     Capillary Refill: Capillary refill takes less than 2 seconds.  Neurological:     Mental Status: She is alert and oriented to person, place, and time.  Psychiatric:        Behavior: Behavior normal.  Musculoskeletal Exam: C-spine was in good range of motion.  Shoulder joints, elbow joints, wrist joints with good range of motion.  She had bilateral PIP and DIP thickening.  Hip joints and knee joints in good range of motion.  Her right knee joint is replaced.  She had tenderness on palpation of bilateral anserine bursa.  Ankle joints, MTPs and PIPs with good range of motion with no synovitis.  CDAI Exam: CDAI Score: -- Patient Global: --; Provider Global: -- Swollen: --; Tender: -- Joint Exam 07/03/2020   No joint exam has been documented for this visit   There is currently no information documented on the homunculus. Go to the Rheumatology activity and complete the homunculus joint exam.  Investigation: No additional findings.  Imaging: No results found.  Recent Labs: Lab Results  Component Value Date   WBC 6.2 11/13/2018   HGB 11.5 (L) 11/13/2018   PLT 207 11/13/2018   NA 141 11/13/2018   K 4.0 11/13/2018   CL 109 11/13/2018   CO2 23 11/13/2018   GLUCOSE 144 (H) 11/13/2018   BUN 14 11/13/2018   CREATININE 0.64 11/13/2018   BILITOT 0.8 10/05/2016   ALKPHOS 113 03/22/2016   AST 21 10/05/2016   ALT 29 10/05/2016   PROT 7.0 10/05/2016   ALBUMIN 4.4 03/22/2016   CALCIUM 9.8 11/13/2018   GFRAA >60 11/13/2018    Speciality Comments: No specialty comments available.  Procedures:  Large Joint Inj: bilateral knee on 07/03/2020 11:34 AM Indications: pain Details: 27  G 1.5 in needle, medial approach  Arthrogram: No  Medications (Right): 1 mL lidocaine 1 %; 30 mg triamcinolone acetonide 40 MG/ML Aspirate (Right): 0 mL Medications (Left): 1 mL lidocaine 1 %; 30 mg triamcinolone acetonide 40 MG/ML Aspirate (Left): 0 mL Outcome: tolerated well, no immediate complications Procedure, treatment alternatives, risks and benefits explained, specific risks discussed. Consent was given by the patient. Immediately prior to procedure a time out was called to verify the correct patient, procedure, equipment, support staff and site/side marked as required. Patient was prepped and draped in the usual sterile fashion.    Allergies: Tizanidine, Toradol [ketorolac tromethamine], Dilaudid [hydromorphone hcl], Penicillins, and Sulfa antibiotics   Assessment / Plan:     Visit Diagnoses: Primary osteoarthritis of both hands-she continues to have some stiffness in her hands.  Joint protection muscle strengthening was discussed.  Pes anserine bursitis - cortisone injection 07/17/19.  She had good response to anserine bursa injection in the past.  She states she has been working at a nursing home and has been standing for long hours.  She has been experiencing anserine bursitis in the bilateral knee joints.  And requests cortisone injection to bilateral anserine bursa.  After informed consent was obtained and side effects were discussed bilateral anserine bursa were injected with cortisone as described above.  She tolerated the procedure well.  Postprocedure instructions were given.  Primary osteoarthritis of left knee-the right knee joint is replaced which is doing well.  She continues to have some discomfort in her left knee joint.  No warmth swelling or effusion was noted.  S/P total knee replacement using cement, right-she had good range of motion without discomfort.  Primary osteoarthritis of both feet-proper fitting shoes were discussed.  Fibromyalgia -her symptoms are  manageable on Robaxin 500 mg twice daily as needed for muscle spasms and tramadol 50 mg 2 times daily as needed for pain relief  Essential hypertension-her blood pressure is elevated today.  She states she has been  under a lot of stress.  She recently lost her sister.  Advised her to monitor blood pressure closely.  Dyslipidemia  History of coronary artery disease  History of thalassemia minor  History of Graves' disease  Kidney stone  COVID-19 virus infection - 01/2020  Orders: Orders Placed This Encounter  Procedures   Large Joint Inj    No orders of the defined types were placed in this encounter.    Follow-Up Instructions: Return in about 5 months (around 12/03/2020) for Osteoarthritis.   Bo Merino, MD  Note - This record has been created using Editor, commissioning.  Chart creation errors have been sought, but may not always  have been located. Such creation errors do not reflect on  the standard of medical care.

## 2020-08-11 ENCOUNTER — Telehealth: Payer: Self-pay | Admitting: Orthopaedic Surgery

## 2020-08-11 NOTE — Telephone Encounter (Signed)
Received vm from patient stating, she works at [employer] facility and has to go through [employer]. She had TKR 2018 and needs a note stating that she does have metal in body due to her TKR. Ph [phone]. Patient also states note can be emailed to her at [email or web address]

## 2020-08-12 ENCOUNTER — Telehealth: Payer: Self-pay | Admitting: Orthopaedic Surgery

## 2020-08-12 NOTE — Telephone Encounter (Signed)
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 She would like a note stating she has metal in her leg. Email it to her please.  Email: [email or web address]

## 2020-08-13 NOTE — Telephone Encounter (Signed)
Called and left voicemail for patient. Letter has emailed to email address provided.

## 2020-11-30 NOTE — Progress Notes (Deleted)
Office Visit Note  Patient: Shannon Jennings             Date of Birth: 01-Aug-1960           MRN: [id number]             PCP: Keane Police, MD Referring: Keane Police, MD Visit Date: 12/07/2020 Occupation: @GUAROCC @  Subjective:  No chief complaint on file.   History of Present Illness: Shannon Jennings is a 60 y.o. female ***   Activities of Daily Living:  Patient reports morning stiffness for *** {minute/hour:19697}.   Patient {ACTIONS;DENIES/REPORTS:21021675::"Denies"} nocturnal pain.  Difficulty dressing/grooming: {ACTIONS;DENIES/REPORTS:21021675::"Denies"} Difficulty climbing stairs: {ACTIONS;DENIES/REPORTS:21021675::"Denies"} Difficulty getting out of chair: {ACTIONS;DENIES/REPORTS:21021675::"Denies"} Difficulty using hands for taps, buttons, cutlery, and/or writing: {ACTIONS;DENIES/REPORTS:21021675::"Denies"}  No Rheumatology ROS completed.   PMFS History:  Patient Active Problem List   Diagnosis Date Noted   Primary osteoarthritis of left knee 05/30/2016   S/P total knee replacement using cement, right 04/12/2016   Fibromyalgia 01/22/2016   Primary osteoarthritis of both hands 01/22/2016   Essential hypertension 01/22/2016   History of coronary artery disease 01/22/2016   History of Graves' disease 01/22/2016   Dyslipidemia 01/22/2016   History of thalassemia minor 01/22/2016    Past Medical History:  Diagnosis Date   Abnormally small mouth    Complication of anesthesia    Coronary artery disease    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 x 4 yr.   Hyperthyroidism    Hypothyroidism    Kidney stones    Kidney stones    per patient    Osteoarthritis    S/P coronary artery stent  placement 04/2011   states right coronary artery   Thalassemia minor     Family History  Problem Relation Age of Onset   Heart disease Mother    Arthritis Mother    Cancer Sister    Seizures Sister    Arthritis Brother    Diabetes Brother    Past Surgical History:  Procedure Laterality Date   ABDOMINAL HERNIA REPAIR  04/2012   ABDOMINAL SURGERY  01/2011   for intestinal blockage   APPENDECTOMY  1984   CARDIAC CATHETERIZATION     CATARACT EXTRACTION W/ INTRAOCULAR LENS IMPLANT Left 05/2020   CHOLECYSTECTOMY  07/2012   COLON SURGERY     COLONOSCOPY     CORONARY ANGIOPLASTY  04/2011   stent x 1   DILATION AND CURETTAGE OF UTERUS     1989   DISTAL INTERPHALANGEAL JOINT FUSION Bilateral 12/25/2012   Procedure: DISTAL INTERPHALANGEAL JOINT FUSION RIGHT AND LEFT SMALL FINGER;  Surgeon: Cammie Sickle., MD;  Location: [place];  Service: Orthopedics;  Laterality: Bilateral;  left and right small finger   ESOPHAGOGASTRODUODENOSCOPY     EXCISION MASS LOWER EXTREMETIES Left 11/15/2018   Procedure: LEFT FOOT DEEP MASS EXCISION;  Surgeon: Erle Crocker, MD;  Location: [place];  Service: Orthopedics;  Laterality: Left;  SURGERY REQUEST TIME 1.5 HOURS   FINGER SURGERY     HAND SURGERY  2014   Bilateral pinky finger distal joint fusion   HAND SURGERY  2016  Right Thumb reconstruction and left middle finger distal joint fusion   HAND SURGERY     Scar tissue removal right palm   HERNIA REPAIR     JOINT REPLACEMENT     right kne total knee replacement   KNEE ARTHROPLASTY     OVARIAN CYST REMOVAL     TOTAL KNEE ARTHROPLASTY Right 03/29/2016   TOTAL KNEE ARTHROPLASTY Right 03/29/2016   Procedure: TOTAL KNEE ARTHROPLASTY;  Surgeon: Garald Balding, MD;  Location: [place];  Service: Orthopedics;  Laterality: Right;   TUBAL LIGATION Bilateral 1993   WISDOM TOOTH EXTRACTION     Social History   Social History Narrative   Not on file   Immunization History   Administered Date(s) Administered   Moderna Sars-Covid-2 Vaccination 02/13/2019, 03/29/2019     Objective: Vital Signs: There were no vitals taken for this visit.   Physical Exam   Musculoskeletal Exam: ***  CDAI Exam: CDAI Score: -- Patient Global: --; Provider Global: -- Swollen: --; Tender: -- Joint Exam 12/07/2020   No joint exam has been documented for this visit   There is currently no information documented on the homunculus. Go to the Rheumatology activity and complete the homunculus joint exam.  Investigation: No additional findings.  Imaging: No results found.  Recent Labs: Lab Results  Component Value Date   WBC 6.2 11/13/2018   HGB 11.5 (L) 11/13/2018   PLT 207 11/13/2018   NA 141 11/13/2018   K 4.0 11/13/2018   CL 109 11/13/2018   CO2 23 11/13/2018   GLUCOSE 144 (H) 11/13/2018   BUN 14 11/13/2018   CREATININE 0.64 11/13/2018   BILITOT 0.8 10/05/2016   ALKPHOS 113 03/22/2016   AST 21 10/05/2016   ALT 29 10/05/2016   PROT 7.0 10/05/2016   ALBUMIN 4.4 03/22/2016   CALCIUM 9.8 11/13/2018   GFRAA >60 11/13/2018    Speciality Comments: No specialty comments available.  Procedures:  No procedures performed Allergies: Tizanidine, Toradol [ketorolac tromethamine], Dilaudid [hydromorphone hcl], Penicillins, and Sulfa antibiotics   Assessment / Plan:     Visit Diagnoses: Primary osteoarthritis of both hands  Pes anserine bursitis  Primary osteoarthritis of left knee  S/P total knee replacement using cement, right  Primary osteoarthritis of both feet  Fibromyalgia  Essential hypertension  Dyslipidemia  History of coronary artery disease  History of thalassemia minor  History of Graves' disease  Kidney stone  Orders: No orders of the defined types were placed in this encounter.  No orders of the defined types were placed in this encounter.   Face-to-face time spent with patient was *** minutes. Greater than 50% of time was spent  in counseling and coordination of care.  Follow-Up Instructions: No follow-ups on file.   Ofilia Neas, PA-C  Note - This record has been created using Dragon software.  Chart creation errors have been sought, but may not always  have been located. Such creation errors do not reflect on  the standard of medical care.

## 2020-12-07 ENCOUNTER — Ambulatory Visit: Payer: BC Managed Care – PPO | Admitting: Rheumatology

## 2020-12-15 NOTE — Progress Notes (Signed)
Office Visit Note  Patient: Shannon Jennings             Date of Birth: [birth year]           MRN: [id number]             PCP: Keane Police, MD Referring: Keane Police, MD Visit Date: 12/29/2020 Occupation: @GUAROCC @  Subjective:  Pain in both knees  History of Present Illness: Shannon Jennings is a 60 y.o. female with history of osteoarthritis and fibromyalgia.  She presents today with increased myalgias and muscle tenderness due to fibromyalgia.  Over the past 1 month she has been experiencing increased generalized myalgias and arthralgias.  She presents today with pain in both knee joints due to pes anserine bursitis.  She requested bilateral cortisone injections which have alleviated her symptoms in the past.  Typically her symptoms improved for about 4 months depending on her activity level.  She states that she is no longer taking tramadol or Prozac.  She has been having difficulty sleeping at night due to nocturnal pain.  She also has persistent fatigue throughout the day secondary to insomnia.    Activities of Daily Living:  Patient reports morning stiffness for 45-60 minutes.   Patient Reports nocturnal pain.  Difficulty dressing/grooming: Denies Difficulty climbing stairs: Reports Difficulty getting out of chair: Reports Difficulty using hands for taps, buttons, cutlery, and/or writing: Denies  Review of Systems  Constitutional:  Positive for fatigue.  HENT:  Negative for mouth sores, mouth dryness and nose dryness.   Eyes:  Negative for pain, itching and dryness.  Respiratory:  Negative for shortness of breath and difficulty breathing.   Cardiovascular:  Negative for chest pain and palpitations.  Gastrointestinal:  Negative for blood in stool, constipation and diarrhea.  Endocrine: Negative for increased urination.  Genitourinary:  Negative for difficulty urinating.  Musculoskeletal:  Positive for joint pain, joint pain, joint swelling, myalgias, morning stiffness,  muscle tenderness and myalgias.  Skin:  Negative for color change, rash and redness.  Allergic/Immunologic: Negative for susceptible to infections.  Neurological:  Negative for dizziness, numbness, headaches, memory loss and weakness.  Hematological:  Positive for bruising/bleeding tendency.  Psychiatric/Behavioral:  Negative for confusion.    PMFS History:  Patient Active Problem List   Diagnosis Date Noted   Primary osteoarthritis of left knee 05/30/2016   S/P total knee replacement using cement, right 04/12/2016   Fibromyalgia 01/22/2016   Primary osteoarthritis of both hands 01/22/2016   Essential hypertension 01/22/2016   History of coronary artery disease 01/22/2016   History of Graves' disease 01/22/2016   Dyslipidemia 01/22/2016   History of thalassemia minor 01/22/2016    Past Medical History:  Diagnosis Date   Abnormally small mouth    Complication of anesthesia    Coronary artery disease    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 x 4 yr.   Hyperthyroidism    Hypothyroidism    Kidney stones    Kidney stones    per patient    Osteoarthritis    S/P coronary artery stent placement 04/2011   states right coronary artery   Thalassemia minor  Family History  Problem Relation Age of Onset   Heart disease Mother    Arthritis Mother    Cancer Sister    Seizures Sister    Arthritis Brother    Diabetes Brother    Past Surgical History:  Procedure Laterality Date   ABDOMINAL HERNIA REPAIR  04/2012   ABDOMINAL SURGERY  01/2011   for intestinal blockage   APPENDECTOMY  1984   CARDIAC CATHETERIZATION     CATARACT EXTRACTION W/ INTRAOCULAR LENS IMPLANT Left 05/2020   CHOLECYSTECTOMY  07/2012    COLON SURGERY     COLONOSCOPY     CORONARY ANGIOPLASTY  04/2011   stent x 1   DILATION AND CURETTAGE OF UTERUS     1989   DISTAL INTERPHALANGEAL JOINT FUSION Bilateral 12/25/2012   Procedure: DISTAL INTERPHALANGEAL JOINT FUSION RIGHT AND LEFT SMALL FINGER;  Surgeon: Cammie Sickle., MD;  Location: [facility];  Service: Orthopedics;  Laterality: Bilateral;  left and right small finger   ESOPHAGOGASTRODUODENOSCOPY     EXCISION MASS LOWER EXTREMETIES Left 11/15/2018   Procedure: LEFT FOOT DEEP MASS EXCISION;  Surgeon: Erle Crocker, MD;  Location: [facility];  Service: Orthopedics;  Laterality: Left;  SURGERY REQUEST TIME 1.5 HOURS   FINGER SURGERY     HAND SURGERY  2014   Bilateral pinky finger distal joint fusion   HAND SURGERY  2016   Right Thumb reconstruction and left middle finger distal joint fusion   HAND SURGERY     Scar tissue removal right palm   HERNIA REPAIR     JOINT REPLACEMENT     right kne total knee replacement   KNEE ARTHROPLASTY     OVARIAN CYST REMOVAL     TOTAL KNEE ARTHROPLASTY Right 03/29/2016   TOTAL KNEE ARTHROPLASTY Right 03/29/2016   Procedure: TOTAL KNEE ARTHROPLASTY;  Surgeon: Garald Balding, MD;  Location: [facility];  Service: Orthopedics;  Laterality: Right;   TUBAL LIGATION Bilateral 1993   WISDOM TOOTH EXTRACTION     Social History   Social History Narrative   Not on file   Immunization History  Administered Date(s) Administered   Moderna Sars-Covid-2 Vaccination 02/13/2019, 03/29/2019     Objective: Vital Signs: BP 124/69 (BP Location: Left Arm, Patient Position: Sitting, Cuff Size: Normal)    Pulse 75    Ht 5\' 3"  (1.6 m)    Wt 157 lb 3.2 oz (71.3 kg)    BMI 27.85 kg/m    Physical Exam Vitals and nursing note reviewed.  Constitutional:      Appearance: She is well-developed.  HENT:     Head: Normocephalic and atraumatic.  Eyes:     Conjunctiva/sclera: Conjunctivae normal.  Pulmonary:     Effort: Pulmonary effort  is normal.  Abdominal:     Palpations: Abdomen is soft.  Musculoskeletal:     Cervical back: Normal range of motion.  Skin:    General: Skin is warm and dry.     Capillary Refill: Capillary refill takes less than 2 seconds.  Neurological:     Mental Status: She is alert and oriented to person, place, and time.  Psychiatric:        Behavior: Behavior normal.     Musculoskeletal Exam: Generalized hyperalgesia and positive tender points on exam.  C-spine has limited range of motion without rotation.  Trapezius muscle tension and tenderness bilaterally.  Shoulder joints, elbow joints, wrist joints, MCPs, PIPs, DIPs have good range of motion with no synovitis.  Complete fist formation bilaterally.  Hip joints have good range of motion with no groin pain.  Right knee replacement has good range of motion with no warmth or effusion.  Tenderness over the Pez anserine bursa bilaterally.  Ankle joints have good range of motion with no tenderness or joint swelling.  CDAI Exam: CDAI Score: -- Patient Global: --; Provider Global: -- Swollen: --; Tender: -- Joint Exam 12/29/2020   No joint exam has been documented for this visit   There is currently no information documented on the homunculus. Go to the Rheumatology activity and complete the homunculus joint exam.  Investigation: No additional findings.  Imaging: No results found.  Recent Labs: Lab Results  Component Value Date   WBC 6.2 11/13/2018   HGB 11.5 (L) 11/13/2018   PLT 207 11/13/2018   NA 141 11/13/2018   K 4.0 11/13/2018   CL 109 11/13/2018   CO2 23 11/13/2018   GLUCOSE 144 (H) 11/13/2018   BUN 14 11/13/2018   CREATININE 0.64 11/13/2018   BILITOT 0.8 10/05/2016   ALKPHOS 113 03/22/2016   AST 21 10/05/2016   ALT 29 10/05/2016   PROT 7.0 10/05/2016   ALBUMIN 4.4 03/22/2016   CALCIUM 9.8 11/13/2018   GFRAA >60 11/13/2018    Speciality Comments: No specialty comments available.  Procedures:  Large Joint Inj:  bilateral knee on 12/29/2020 3:49 PM Indications: pain Details: 27 G 1.5 in needle, anteromedial approach  Arthrogram: No  Medications (Right): 1 mL lidocaine 1 %; 30 mg triamcinolone acetonide 40 MG/ML Aspirate (Right): 0 mL Medications (Left): 1 mL lidocaine 1 %; 30 mg triamcinolone acetonide 40 MG/ML Aspirate (Left): 0 mL Outcome: tolerated well, no immediate complications Procedure, treatment alternatives, risks and benefits explained, specific risks discussed. Consent was given by the patient. Immediately prior to procedure a time out was called to verify the correct patient, procedure, equipment, support staff and site/side marked as required. Patient was prepped and draped in the usual sterile fashion.    Allergies: Tizanidine, Toradol [ketorolac tromethamine], Trokendi xr [topiramate er], Dilaudid [hydromorphone hcl], Penicillins, and Sulfa antibiotics   Assessment / Plan:     Visit Diagnoses: Primary osteoarthritis of both hands: She has PIP and DIP thickening consistent with osteoarthritis of both hands.  No tenderness or inflammation was noted on examination today.  She was able to make a complete fist bilaterally.  Discussed the importance of joint protection and muscle strengthening.  Pes anserine bursitis: She presents today with discomfort in both knee joints due to Pes anserine bursitis bilaterally.  Tenderness and mild inflammation was noted bilaterally.  She had cortisone injections performed on 07/03/2020 for both knees which provided significant relief for about 4 months.  She requested repeat injections today.  She tolerated procedures well.  Procedure note completed above.  Aftercare was discussed.  Primary osteoarthritis of left knee: She has good range of motion of the left knee joint on examination today.  No warmth or effusion was noted.  She has tenderness over the pes anserine bursa of the left knee.  She requested a cortisone injection today.  She tolerated procedure  well.  Procedure note was completed above.  S/P total knee replacement using cement, right: Overall her right knee replacement has been doing well. She has been experiencing pain in the right Pes anserine bursa for the past 1 month.   She had a bursa injection on 07/03/2020 which provided significant relief.  She requested a cortisone injection today.  She tolerated procedure well.  Procedure note was completed above.  Primary osteoarthritis of both feet: She is not experiencing any increased discomfort in her feet at this time.  She has good range of motion of both ankle joints with no tenderness or joint swelling.  She is wearing proper fitting shoes.  Fibromyalgia: Generalized hyperalgesia and positive tender points on exam.  She has been having more frequent and severe fibromyalgia flares over the past 1 month.  She has trapezius muscle tension and tenderness bilaterally.  She presents today with Pes anserine bursitis bilaterally.  She requested injections in bilateral bursa today.  She tolerated the procedure well.  Discussed the importance of regular exercise and good sleep hygiene. She is no longer taking tramadol or Prozac.  She feels that her fibromyalgia pain has been worsening and she is also been experiencing increased nocturnal pain which has been contributing to her insomnia.  She has had interrupted sleep at night but does not want to restart on Ambien at this time. Different treatment options for fibromyalgia were discussed today in detail.  She would like to try Cymbalta.  Indications, contraindications, potential side effects of Cymbalta were discussed today.  Discussed the risk for possible suicidal ideation or mood changes.  She was advised to monitor her symptoms closely.  A handout of information about Cymbalta was provided to the patient to further review.  A prescription for low-dose Cymbalta 30 mg 1 capsule daily was sent to the pharmacy.  She was advised to notify us if she cannot  tolerate taking Cymbalta.  Discussed that we can always try an increased dose in the future if necessary.  Other medical conditions are listed as follows:  Essential hypertension: Blood pressure was 124/69 today in the office.  Dyslipidemia  History of coronary artery disease  History of Graves' disease  History of thalassemia minor  Kidney stone  COVID-19 virus infection - 01/2020  Orders: Orders Placed This Encounter  Procedures   Large Joint Inj   Meds ordered this encounter  Medications   DULoxetine (CYMBALTA) 30 MG capsule    Sig: Take 1 capsule (30 mg total) by mouth daily.    Dispense:  30 capsule    Refill:  0     Follow-Up Instructions: Return in about 6 months (around 06/29/2021) for Osteoarthritis, Fibromyalgia.   Ofilia Neas, PA-C  Note - This record has been created using Dragon software.  Chart creation errors have been sought, but may not always  have been located. Such creation errors do not reflect on  the standard of medical care.

## 2020-12-29 ENCOUNTER — Encounter: Payer: Self-pay | Admitting: Physician Assistant

## 2020-12-29 ENCOUNTER — Ambulatory Visit (INDEPENDENT_AMBULATORY_CARE_PROVIDER_SITE_OTHER): Payer: PRIVATE HEALTH INSURANCE | Admitting: Physician Assistant

## 2020-12-29 ENCOUNTER — Other Ambulatory Visit: Payer: Self-pay

## 2020-12-29 VITALS — BP 124/69 | HR 75 | Ht 63.0 in | Wt 157.2 lb

## 2020-12-29 DIAGNOSIS — N200 Calculus of kidney: Secondary | ICD-10-CM

## 2020-12-29 DIAGNOSIS — Z862 Personal history of diseases of the blood and blood-forming organs and certain disorders involving the immune mechanism: Secondary | ICD-10-CM

## 2020-12-29 DIAGNOSIS — M19071 Primary osteoarthritis, right ankle and foot: Secondary | ICD-10-CM

## 2020-12-29 DIAGNOSIS — M1712 Unilateral primary osteoarthritis, left knee: Secondary | ICD-10-CM

## 2020-12-29 DIAGNOSIS — M7050 Other bursitis of knee, unspecified knee: Secondary | ICD-10-CM

## 2020-12-29 DIAGNOSIS — Z96651 Presence of right artificial knee joint: Secondary | ICD-10-CM

## 2020-12-29 DIAGNOSIS — Z8639 Personal history of other endocrine, nutritional and metabolic disease: Secondary | ICD-10-CM

## 2020-12-29 DIAGNOSIS — M19042 Primary osteoarthritis, left hand: Secondary | ICD-10-CM

## 2020-12-29 DIAGNOSIS — M19072 Primary osteoarthritis, left ankle and foot: Secondary | ICD-10-CM

## 2020-12-29 DIAGNOSIS — Z8679 Personal history of other diseases of the circulatory system: Secondary | ICD-10-CM

## 2020-12-29 DIAGNOSIS — M797 Fibromyalgia: Secondary | ICD-10-CM

## 2020-12-29 DIAGNOSIS — I10 Essential (primary) hypertension: Secondary | ICD-10-CM

## 2020-12-29 DIAGNOSIS — E785 Hyperlipidemia, unspecified: Secondary | ICD-10-CM

## 2020-12-29 DIAGNOSIS — U071 COVID-19: Secondary | ICD-10-CM

## 2020-12-29 DIAGNOSIS — M19041 Primary osteoarthritis, right hand: Secondary | ICD-10-CM | POA: Diagnosis not present

## 2020-12-29 MED ORDER — TRIAMCINOLONE ACETONIDE 40 MG/ML IJ SUSP
30.0000 mg | INTRAMUSCULAR | Status: AC | PRN
  Administered 2020-12-29: 30 mg via INTRA_ARTICULAR

## 2020-12-29 MED ORDER — DULOXETINE HCL 30 MG PO CPEP: 30.0000 mg | ORAL_CAPSULE | Freq: Every day | ORAL | 0 refills | Status: DC

## 2020-12-29 MED ORDER — LIDOCAINE HCL 1 % IJ SOLN
1.0000 mL | INTRAMUSCULAR | Status: AC | PRN
  Administered 2020-12-29: 1 mL

## 2020-12-29 NOTE — Patient Instructions (Signed)
Duloxetine Delayed-Release Capsules What is this medication? DULOXETINE (doo LOX e teen) treats depression, anxiety, fibromyalgia, and certain types of chronic pain such as nerve, bone, or joint pain. It increases the amount of serotonin and norepinephrine in the brain, hormones that help regulate mood and pain. It belongs to a group of medications called SNRIs. This medicine may be used for other purposes; ask your health care provider or pharmacist if you have questions. COMMON BRAND NAME(S): Cymbalta, Drizalma, Irenka What should I tell my care team before I take this medication? They need to know if you have any of these conditions: Bipolar disorder Glaucoma High blood pressure Kidney disease Liver disease Seizures Suicidal thoughts, plans or attempt; a previous suicide attempt by you or a family member Take medications that treat or prevent blood clots Taken medications called MAOIs like Carbex, Eldepryl, Marplan, Nardil, and Parnate within 14 days Trouble passing urine An unusual reaction to duloxetine, other medications, foods, dyes, or preservatives Pregnant or trying to get pregnant Breast-feeding How should I use this medication? Take this medication by mouth with a glass of water. Follow the directions on the prescription label. Do not crush, cut or chew some capsules of this medication. Some capsules may be opened and sprinkled on applesauce. Check with your care team or pharmacist if you are not sure. You can take this medication with or without food. Take your medication at regular intervals. Do not take your medication more often than directed. Do not stop taking this medication suddenly except upon the advice of your care team. Stopping this medication too quickly may cause serious side effects or your condition may worsen. A special MedGuide will be given to you by the pharmacist with each prescription and refill. Be sure to read this information carefully each time. Talk to  your care team regarding the use of this medication in children. While this medication may be prescribed for children as young as 78 years of age for selected conditions, precautions do apply. Overdosage: If you think you have taken too much of this medicine contact a poison control center or emergency room at once. NOTE: This medicine is only for you. Do not share this medicine with others. What if I miss a dose? If you miss a dose, take it as soon as you can. If it is almost time for your next dose, take only that dose. Do not take double or extra doses. What may interact with this medication? Do not take this medication with any of the following: Desvenlafaxine Levomilnacipran Linezolid MAOIs like Carbex, Eldepryl, Emsam, Marplan, Nardil, and Parnate Methylene blue (injected into a vein) Milnacipran Safinamide Thioridazine Venlafaxine Viloxazine This medication may also interact with the following: Alcohol Amphetamines Aspirin and aspirin-like medications Certain antibiotics like ciprofloxacin and enoxacin Certain medications for blood pressure, heart disease, irregular heart beat Certain medications for depression, anxiety, or psychotic disturbances Certain medications for migraine headache like almotriptan, eletriptan, frovatriptan, naratriptan, rizatriptan, sumatriptan, zolmitriptan Certain medications that treat or prevent blood clots like warfarin, enoxaparin, and dalteparin Cimetidine Fentanyl Lithium NSAIDS, medications for pain and inflammation, like ibuprofen or naproxen Phentermine Procarbazine Rasagiline Sibutramine St. John's wort Theophylline Tramadol Tryptophan This list may not describe all possible interactions. Give your health care provider a list of all the medicines, herbs, non-prescription drugs, or dietary supplements you use. Also tell them if you smoke, drink alcohol, or use illegal drugs. Some items may interact with your medicine. What should I watch  for while using this medication? Tell your  care team if your symptoms do not get better or if they get worse. Visit your care team for regular checks on your progress. Because it may take several weeks to see the full effects of this medication, it is important to continue your treatment as prescribed by your care team. This medication may cause serious skin reactions. They can happen weeks to months after starting the medication. Contact your care team right away if you notice fevers or flu-like symptoms with a rash. The rash may be red or purple and then turn into blisters or peeling of the skin. Or, you might notice a red rash with swelling of the face, lips, or lymph nodes in your neck or under your arms. Watch for new or worsening thoughts of suicide or depression. This includes sudden changes in mood, behaviors, or thoughts. These changes can happen at any time but are more common in the beginning of treatment or after a change in dose. Call your care team right away if you experience these thoughts or worsening depression. Manic episodes may happen in patients with bipolar disorder who take this medication. Watch for changes in feelings or behaviors such as feeling anxious, nervous, agitated, panicky, irritable, hostile, aggressive, impulsive, severely restless, overly excited and hyperactive, or trouble sleeping. These symptoms can happen at any time, but are more common in the beginning of treatment or after a change in dose. Call your care team right away if you notice any of these symptoms. You may get drowsy or dizzy. Do not drive, use machinery, or do anything that needs mental alertness until you know how this medication affects you. Do not stand or sit up quickly, especially if you are an older patient. This reduces the risk of dizzy or fainting spells. Alcohol may interfere with the effect of this medication. Avoid alcoholic drinks. This medication may increase blood sugar. The risk may be  higher in patients who already have diabetes. Ask your care team what you can do to lower your risk of diabetes while taking this medication. This medication can cause an increase in blood pressure. This medication can also cause a sudden drop in your blood pressure, which may make you feel faint and increase the chance of a fall. These effects are most common when you first start the medication or when the dose is increased, or during use of other medications that can cause a sudden drop in blood pressure. Check with your care team for instructions on monitoring your blood pressure while taking this medication. Your mouth may get dry. Chewing sugarless gum or sucking hard candy, and drinking plenty of water, may help. Contact your care team if the problem does not go away or is severe. What side effects may I notice from receiving this medication? Side effects that you should report to your care team as soon as possible: Allergic reactions--skin rash, itching, hives, swelling of the face, lips, tongue, or throat Bleeding--bloody or black, tar-like stools, red or dark brown urine, vomiting blood or brown material that looks like coffee grounds, small, red or purple spots on skin, unusual bleeding or bruising Increase in blood pressure Liver injury--right upper belly pain, loss of appetite, nausea, light-colored stool, dark yellow or brown urine, yellowing skin or eyes, unusual weakness or fatigue Low sodium level--muscle weakness, fatigue, dizziness, headache, confusion Redness, blistering, peeling, or loosening of the skin, including inside the mouth Serotonin syndrome--irritability, confusion, fast or irregular heartbeat, muscle stiffness, twitching muscles, sweating, high fever, seizures, chills, vomiting, diarrhea  Sudden eye pain or change in vision such as blurry vision, seeing halos around lights, vision loss Thoughts of suicide or self-harm, worsening mood, feelings of depression Trouble passing  urine Side effects that usually do not require medical attention (report to your care team if they continue or are bothersome): Change in sex drive or performance Constipation Diarrhea Dizziness Dry mouth Excessive sweating Loss of appetite Nausea Vomiting This list may not describe all possible side effects. Call your doctor for medical advice about side effects. You may report side effects to FDA at 1-800-FDA-1088. Where should I keep my medication? Keep out of the reach of children and pets. Store at room temperature between 15 and 30 degrees C (59 to 86 degrees F). Get rid of any unused medication after the expiration date. To get rid of medications that are no longer needed or have expired: Take the medication to a medication take-back program. Check with your pharmacy or law enforcement to find a location. If you cannot return the medication, check the label or package insert to see if the medication should be thrown out in the garbage or flushed down the toilet. If you are not sure, ask your care team. If it is safe to put it in the trash, take the medication out of the container. Mix the medication with cat litter, dirt, coffee grounds, or other unwanted substance. Seal the mixture in a bag or container. Put it in the trash. NOTE: This sheet is a summary. It may not cover all possible information. If you have questions about this medicine, talk to your doctor, pharmacist, or health care provider.  2022 [employer]vier/Gold Standard (2019-12-31 00:00:00)

## 2021-01-20 ENCOUNTER — Other Ambulatory Visit: Payer: Self-pay | Admitting: Physician Assistant

## 2021-01-20 NOTE — Telephone Encounter (Signed)
Next Visit: Due June 2023. Message sent to the front to schedule patient   Last Visit: 12/29/2020  Last Fill: 12/29/2020 ( 30 day supply)  Dx: Fibromyalgia  Current Dose per office note on 12/29/2020: Cymbalta 30 mg 1 capsule daily   Okay to refill Cymbalta?

## 2021-01-20 NOTE — Telephone Encounter (Signed)
Please schedule patient for a follow up visit. Patient due June 2023.

## 2021-01-22 NOTE — Progress Notes (Signed)
Office Visit Note  Patient: Shannon Jennings             Date of Birth: [birth year]           MRN: [id number]             PCP: Keane Police, MD Referring: Keane Police, MD Visit Date: 01/27/2021 Occupation: @GUAROCC @  Subjective:  Left knee pain   History of Present Illness: Shannon Jennings is a 61 y.o. female with history of osteoarthritis and fibromyalgia.  Patient presents today with increased discomfort in the left knee joint.  She was recently evaluated by her PCP who prescribed prednisone 10 mg daily starting on 01/21/2021.  She has a few more doses of prednisone to take.  She states that the inflammation in her left knee joint has improved while taking prednisone.  She has also been using Voltaren gel topically as needed for pain relief.  She would like to reapply for Hyalgan injections for the left knee which has alleviated her symptoms in the past.  She reports that her PCP started her on Plaquenil about 1 week ago due to the pain and inflammation she is experiencing in the left knee.  According to the patient she has family history of rheumatoid arthritis.  She has been tolerating Plaquenil without any side effects. She states that after her last office visit on 12/29/2020 she tried taking Cymbalta for about 3 weeks.  She discontinued Cymbalta due to side effects including paresthesias and increased urinary frequency.  She has restarted taking Prozac as prescribed. She continues to experience intermittent myalgias and muscle tenderness due to fibromyalgia especially in her lower extremities.   Activities of Daily Living:  Patient reports morning stiffness for 30 minutes  Patient Reports nocturnal pain.  Difficulty dressing/grooming: Denies Difficulty climbing stairs: Reports Difficulty getting out of chair: Reports Difficulty using hands for taps, buttons, cutlery, and/or writing: Denies  Review of Systems  Constitutional:  Negative for fatigue.  HENT:  Negative for mouth  sores, mouth dryness and nose dryness.   Eyes:  Negative for pain, visual disturbance and dryness.  Respiratory:  Negative for cough, hemoptysis, shortness of breath and difficulty breathing.   Cardiovascular:  Negative for chest pain, palpitations, hypertension and swelling in legs/feet.  Gastrointestinal:  Negative for blood in stool, constipation and diarrhea.  Endocrine: Negative for increased urination.  Genitourinary:  Negative for painful urination.  Musculoskeletal:  Positive for joint pain, joint pain, myalgias, morning stiffness, muscle tenderness and myalgias. Negative for joint swelling and muscle weakness.  Skin:  Negative for color change, pallor, rash, hair loss, nodules/bumps, skin tightness, ulcers and sensitivity to sunlight.  Allergic/Immunologic: Negative for susceptible to infections.  Neurological:  Negative for dizziness, numbness, headaches and weakness.  Hematological:  Negative for swollen glands.  Psychiatric/Behavioral:  Positive for sleep disturbance. Negative for depressed mood. The patient is not nervous/anxious.    PMFS History:  Patient Active Problem List   Diagnosis Date Noted   Primary osteoarthritis of left knee 05/30/2016   S/P total knee replacement using cement, right 04/12/2016   Fibromyalgia 01/22/2016   Primary osteoarthritis of both hands 01/22/2016   Essential hypertension 01/22/2016   History of coronary artery disease 01/22/2016   History of Graves' disease 01/22/2016   Dyslipidemia 01/22/2016   History of thalassemia minor 01/22/2016    Past Medical History:  Diagnosis Date   Abnormally small mouth    Complication of anesthesia    Coronary artery disease  Degenerative joint disease of hand 12/2012   right and left small fingers   Difficult intubation 07/2012   Fibromyalgia    Headache    migraines   High cholesterol    History of Graves' disease    History of kidney stones    History of migraine    Hypertension    under  control with med., has been on med. x 4 yr.   Hyperthyroidism    Hypothyroidism    Kidney stones    Kidney stones    per patient    Osteoarthritis    S/P coronary artery stent placement 04/2011   states right coronary artery   Thalassemia minor     Family History  Problem Relation Age of Onset   Heart disease Mother    Arthritis Mother    Cancer Sister    Seizures Sister    Arthritis Brother    Diabetes Brother    Past Surgical History:  Procedure Laterality Date   ABDOMINAL HERNIA REPAIR  04/2012   ABDOMINAL SURGERY  01/2011   for intestinal blockage   APPENDECTOMY  1984   CARDIAC CATHETERIZATION     CATARACT EXTRACTION W/ INTRAOCULAR LENS IMPLANT Left 05/2020   CHOLECYSTECTOMY  07/2012   COLON SURGERY     COLONOSCOPY     CORONARY ANGIOPLASTY  04/2011   stent x 1   DILATION AND CURETTAGE OF UTERUS     1989   DISTAL INTERPHALANGEAL JOINT FUSION Bilateral 12/25/2012   Procedure: DISTAL INTERPHALANGEAL JOINT FUSION RIGHT AND LEFT SMALL FINGER;  Surgeon: Cammie Sickle., MD;  Location: [place];  Service: Orthopedics;  Laterality: Bilateral;  left and right small finger   ESOPHAGOGASTRODUODENOSCOPY     EXCISION MASS LOWER EXTREMETIES Left 11/15/2018   Procedure: LEFT FOOT DEEP MASS EXCISION;  Surgeon: Erle Crocker, MD;  Location: [place];  Service: Orthopedics;  Laterality: Left;  SURGERY REQUEST TIME 1.5 HOURS   FINGER SURGERY     HAND SURGERY  2014   Bilateral pinky finger distal joint fusion   HAND SURGERY  2016   Right Thumb reconstruction and left middle finger distal joint fusion   HAND SURGERY     Scar tissue removal right palm   HERNIA REPAIR     JOINT REPLACEMENT     right kne total knee replacement   KNEE ARTHROPLASTY     OVARIAN CYST REMOVAL     TOTAL KNEE ARTHROPLASTY Right 03/29/2016   TOTAL KNEE ARTHROPLASTY Right 03/29/2016   Procedure: TOTAL KNEE ARTHROPLASTY;  Surgeon: Garald Balding, MD;  Location: [place];  Service:  Orthopedics;  Laterality: Right;   TUBAL LIGATION Bilateral 1993   WISDOM TOOTH EXTRACTION     Social History   Social History Narrative   Not on file   Immunization History  Administered Date(s) Administered   Moderna Sars-Covid-2 Vaccination 02/13/2019, 03/29/2019     Objective: Vital Signs: BP 126/80 (BP Location: Left Arm, Patient Position: Sitting, Cuff Size: Large)    Pulse 79    Resp 13    Ht 5\' 3"  (1.6 m)    Wt 157 lb (71.2 kg)    BMI 27.81 kg/m    Physical Exam Vitals and nursing note reviewed.  Constitutional:      Appearance: She is well-developed.  HENT:     Head: Normocephalic and atraumatic.  Eyes:     Conjunctiva/sclera: Conjunctivae normal.  Pulmonary:     Effort: Pulmonary effort is normal.  Abdominal:     Palpations: Abdomen is soft.  Musculoskeletal:     Cervical back: Normal range of motion.  Skin:    General: Skin is warm and dry.     Capillary Refill: Capillary refill takes less than 2 seconds.  Neurological:     Mental Status: She is alert and oriented to person, place, and time.  Psychiatric:        Behavior: Behavior normal.     Musculoskeletal Exam: C-spine has good range of motion with no discomfort.  Some trapezius muscle tension and tenderness bilaterally.  Shoulder joints, elbow joints, wrist joints have good range of motion with no synovitis.  Hip joints have good range of motion with no groin pain.  Right knee replacement has good range of motion with no warmth or effusion.  Painful extension of the left knee joint with some joint swelling.  Ankle joints have good range of motion with no tenderness or joint swelling.  CDAI Exam: CDAI Score: -- Patient Global: --; Provider Global: -- Swollen: --; Tender: -- Joint Exam 01/27/2021   No joint exam has been documented for this visit   There is currently no information documented on the homunculus. Go to the Rheumatology activity and complete the homunculus joint exam.  Investigation: No  additional findings.  Imaging: No results found.  Recent Labs: Lab Results  Component Value Date   WBC 6.2 11/13/2018   HGB 11.5 (L) 11/13/2018   PLT 207 11/13/2018   NA 141 11/13/2018   K 4.0 11/13/2018   CL 109 11/13/2018   CO2 23 11/13/2018   GLUCOSE 144 (H) 11/13/2018   BUN 14 11/13/2018   CREATININE 0.64 11/13/2018   BILITOT 0.8 10/05/2016   ALKPHOS 113 03/22/2016   AST 21 10/05/2016   ALT 29 10/05/2016   PROT 7.0 10/05/2016   ALBUMIN 4.4 03/22/2016   CALCIUM 9.8 11/13/2018   GFRAA >60 11/13/2018    Speciality Comments: No specialty comments available.  Procedures:  No procedures performed Allergies: Tizanidine, Toradol [ketorolac tromethamine], Trokendi xr [topiramate er], Dilaudid [hydromorphone hcl], Penicillins, and Sulfa antibiotics   Assessment / Plan:     Visit Diagnoses: Primary osteoarthritis of both hands: She has PIP and DIP thickening consistent with osteoarthritis of both hands.  Limited extension of several PIP and DIP joints due to postsurgical fusion.  No tenderness or synovitis over MCP joints.  No clinical features of rheumatoid arthritis noted.  She was able to make a complete fist bilaterally.  Pes anserine bursitis: She had a good response to anserine bursa cortisone injections on 12/29/2020.  Primary osteoarthritis of left knee - She presents today with increased pain in the left knee joint.  She has not had any recent injury or fall.  She was recently evaluated by her PCP who prescribed prednisone 10 mg daily starting on 01/21/2021.  She is also been using Voltaren gel topically as needed for symptomatic relief.  According to the patient her PCP started her on Plaquenil as a trial due to the pain and inflammation she was experiencing in the left knee joint.  According to the patient she has family history of rheumatoid arthritis.  She has been tolerating Plaquenil without any side effects.   X-rays of the left knee joint were updated today.  She  would like to reapply for Hyalgan injections for the left knee.  Plan: XR KNEE 3 VIEW LEFT  Chronic pain of left knee -She presents today with increased discomfort in the left knee  joint.  She recently experienced an episode of warmth and swelling in the left knee and was evaluated by her PCP.  She was started on prednisone 10 mg daily on 01/21/2021.  She has a few doses of prednisone left.  She was also started on a trial of Plaquenil by her PCP due to her family history of rheumatoid arthritis.  She has been tolerating Plaquenil without any side effects but has not noticed any clinical improvement yet.  Discussed possible side effects of Plaquenil including risk for QT prolongation.  She is followed closely by cardiologist.  X-rays of the left knee joint were updated today.  She would like to reapply for Hyalgan injections for the left knee which were effective for the right knee in the past.  Plan: XR KNEE 3 VIEW LEFT She plans on continuing lower extremity muscle strengthening exercises and will continue to make dietary modifications.  This patient is diagnosed with osteoarthritis of the left knee.  Radiographs show evidence of joint space narrowing, osteophytes, subchondral sclerosis and/or subchondral cysts.  This patient has knee pain which interferes with functional and activities of daily living.    This patient has experienced inadequate response, adverse effects and/or intolerance with conservative treatments such as acetaminophen, NSAIDS, topical creams, physical therapy or regular exercise, knee bracing and/or weight loss.   This patient has experienced inadequate response or has a contraindication to intra articular steroid injections for at least 3 months.   This patient is not scheduled to have a total knee replacement within 6 months of starting treatment with viscosupplementation.  S/P total knee replacement using cement, right: Doing well.  She has good ROM with no discomfort.  No  warmth or effusion was noted.  Primary osteoarthritis of both feet: She is not experiencing any discomfort in her feet at this time.   Fibromyalgia - She continues to experience intermittent myalgias and muscle tenderness due to fibromyalgia.  After her last office visit on 12/29/2020 she was started on Cymbalta 30 mg 1 capsule by mouth daily.  She took Cymbalta for 3 weeks but discontinued due to intolerance.  She was experiencing paresthesias and increased urinary frequency while taking Cymbalta.  She has restarted Prozac as previously prescribed. She continues to experience some myofascial pain in both legs feels that her upper body pain has been more tolerable since trying Cymbalta. She continues to have some trapezius muscle tension and tenderness bilaterally.  She takes methocarbamol 500 mg 1 tablet daily as needed for muscle spasms.  Other medical conditions are listed as follows:   Essential hypertension: BP was 126/80 today in the office.   Dyslipidemia  History of thalassemia minor  History of Graves' disease  History of coronary artery disease  Kidney stone  COVID-19 virus infection - 01/2020   Orders: Orders Placed This Encounter  Procedures   XR KNEE 3 VIEW LEFT   No orders of the defined types were placed in this encounter.    Follow-Up Instructions: Return in about 6 months (around 07/27/2021) for Fibromyalgia, Osteoarthritis.   Ofilia Neas, PA-C  Note - This record has been created using Dragon software.  Chart creation errors have been sought, but may not always  have been located. Such creation errors do not reflect on  the standard of medical care.

## 2021-01-27 ENCOUNTER — Telehealth: Payer: Self-pay

## 2021-01-27 ENCOUNTER — Encounter: Payer: Self-pay | Admitting: Physician Assistant

## 2021-01-27 ENCOUNTER — Ambulatory Visit (INDEPENDENT_AMBULATORY_CARE_PROVIDER_SITE_OTHER): Payer: PRIVATE HEALTH INSURANCE | Admitting: Physician Assistant

## 2021-01-27 ENCOUNTER — Other Ambulatory Visit: Payer: Self-pay

## 2021-01-27 ENCOUNTER — Ambulatory Visit: Payer: Self-pay

## 2021-01-27 VITALS — BP 126/80 | HR 79 | Resp 13 | Ht 63.0 in | Wt 157.0 lb

## 2021-01-27 DIAGNOSIS — M19072 Primary osteoarthritis, left ankle and foot: Secondary | ICD-10-CM

## 2021-01-27 DIAGNOSIS — M19071 Primary osteoarthritis, right ankle and foot: Secondary | ICD-10-CM

## 2021-01-27 DIAGNOSIS — M19042 Primary osteoarthritis, left hand: Secondary | ICD-10-CM

## 2021-01-27 DIAGNOSIS — Z862 Personal history of diseases of the blood and blood-forming organs and certain disorders involving the immune mechanism: Secondary | ICD-10-CM

## 2021-01-27 DIAGNOSIS — Z96651 Presence of right artificial knee joint: Secondary | ICD-10-CM

## 2021-01-27 DIAGNOSIS — M797 Fibromyalgia: Secondary | ICD-10-CM

## 2021-01-27 DIAGNOSIS — I10 Essential (primary) hypertension: Secondary | ICD-10-CM

## 2021-01-27 DIAGNOSIS — G8929 Other chronic pain: Secondary | ICD-10-CM | POA: Diagnosis not present

## 2021-01-27 DIAGNOSIS — N200 Calculus of kidney: Secondary | ICD-10-CM

## 2021-01-27 DIAGNOSIS — M25562 Pain in left knee: Secondary | ICD-10-CM | POA: Diagnosis not present

## 2021-01-27 DIAGNOSIS — E785 Hyperlipidemia, unspecified: Secondary | ICD-10-CM

## 2021-01-27 DIAGNOSIS — U071 COVID-19: Secondary | ICD-10-CM

## 2021-01-27 DIAGNOSIS — Z8679 Personal history of other diseases of the circulatory system: Secondary | ICD-10-CM

## 2021-01-27 DIAGNOSIS — M1712 Unilateral primary osteoarthritis, left knee: Secondary | ICD-10-CM | POA: Diagnosis not present

## 2021-01-27 DIAGNOSIS — M7050 Other bursitis of knee, unspecified knee: Secondary | ICD-10-CM

## 2021-01-27 DIAGNOSIS — M19041 Primary osteoarthritis, right hand: Secondary | ICD-10-CM

## 2021-01-27 DIAGNOSIS — Z8639 Personal history of other endocrine, nutritional and metabolic disease: Secondary | ICD-10-CM

## 2021-01-27 NOTE — Telephone Encounter (Signed)
Please apply for left knee hyalgan, per Hazel Sams, PA-C. Thanks!

## 2021-01-27 NOTE — Progress Notes (Unsigned)
Patient verified

## 2021-01-27 NOTE — Progress Notes (Signed)
X-rays of the left knee were consistent with severe osteoarthritis and mild chondromalacia patella.

## 2021-01-28 NOTE — Telephone Encounter (Signed)
VOB submitted for Orthovisc - Left Knee BV pending

## 2021-02-18 NOTE — Telephone Encounter (Signed)
Called patient to let her know a prior authorization is needed for Visco knee injections.  Patient states she scheduled an appointment with Dr. Durward Fortes at [facility] to evaluate her left knee.

## 2021-03-17 ENCOUNTER — Telehealth: Payer: Self-pay

## 2021-03-17 ENCOUNTER — Ambulatory Visit: Payer: PRIVATE HEALTH INSURANCE | Admitting: Orthopaedic Surgery

## 2021-03-17 ENCOUNTER — Encounter: Payer: Self-pay | Admitting: Orthopaedic Surgery

## 2021-03-17 ENCOUNTER — Other Ambulatory Visit: Payer: Self-pay

## 2021-03-17 VITALS — Ht 63.0 in | Wt 160.0 lb

## 2021-03-17 DIAGNOSIS — M1712 Unilateral primary osteoarthritis, left knee: Secondary | ICD-10-CM

## 2021-03-17 NOTE — Telephone Encounter (Signed)
Noted  

## 2021-03-17 NOTE — Telephone Encounter (Signed)
Please precert for left knee gel injections. [facility]'s patient.  ?

## 2021-03-17 NOTE — Progress Notes (Addendum)
? ?Office Visit Note ?  ?Patient: Shannon Jennings           ?Date of Birth: 1960-12-18           ?MRN: [id number] ?Visit Date: 03/17/2021 ?             ?Requested by: Keane Police, MD ?[address] ?[place] [address] ?PCP: Keane Police, MD ? ? ?Assessment & Plan: ?Visit Diagnoses: left knee pain ? ?Plan: Patient is a pleasant 61 year old woman with a history of right knee replacement and now continuing pain in her left knee.  She has tried conservative treatment.  She has tried cortisone injections which have helped minimally for short period of time and driven up her blood sugars.  She also has been on oral prednisone which again made her blood sugars significantly higher than her last A1c being 7.6.  Fasting sugar 247.  She does use a knee support on her left knee when she is on her leg for any period of time.  Most of her pain is medial.  She did try to get Visco supplementation from her rheumatologist but it was denied by insurance.  She does not feel she is at a point where she wants a knee replacement yet.  She is taking Tylenol and tramadol.  We will go forward with authorization for viscosupplementation. ?This patient is diagnosed with osteoarthritis of the knee(s).   ? ?Radiographs show evidence of joint space narrowing, osteophytes, subchondral sclerosis and/or subchondral cysts.  This patient has knee pain which interferes with functional and activities of daily living.   ? ?This patient has experienced inadequate response, adverse effects and/or intolerance with conservative treatments such as acetaminophen, NSAIDS, topical creams, physical therapy or regular exercise, knee bracing and/or weight loss.  ? ?This patient has experienced inadequate response or has a contraindication to intra articular steroid injections for at least 3 months.  ? ?This patient is not scheduled to have a total knee replacement within 6 months of starting treatment with viscosupplementation.  ? ?Follow-Up  Instructions: No follow-ups on file.  ? ?Orders:  ?No orders of the defined types were placed in this encounter. ? ?No orders of the defined types were placed in this encounter. ? ? ? ? Procedures: ?No procedures performed ? ? ?Clinical Data: ?No additional findings. ? ? ?Subjective: ?Chief Complaint  ?Patient presents with  ? Left Knee - Pain  ?Patient presents today for left knee pain. She said that it has bothered her for years, but worsening in the last 5 months. She said that her pain is located medially. She takes tramadol twice daily, along with over the counter pain medicine. She has seen Dr.Deveswhar in the past and had x-rays. She said that she use to receive cortisone injections but does not want those anymore because it raises her a1c since she is diabetic. She states that Dr.Deveswhar tried to get approval for gel injections but they were denied through her insurance.  ? ? ? ?Review of Systems  ?All other systems reviewed and are negative. ? ? ?Objective: ?Vital Signs: There were no vitals taken for this visit. ? ?Physical Exam ?Constitutional:   ?   Appearance: Normal appearance.  ?Pulmonary:  ?   Effort: Pulmonary effort is normal.  ?Skin: ?   General: Skin is warm and dry.  ?Neurological:  ?   General: No focal deficit present.  ?   Mental Status: She is alert.  ? ? ?Ortho Exam ?[employer]  of her left knee she has no effusion no redness no ascending cellulitis.  Compartments are soft.  She does have some slight varus alignment.  She has tenderness over the medial posterior medial joint line.  Minimal tenderness laterally she has significant grinding with repetitive range of motion of her leg she has good strength with extension.  Good varus valgus stability ?Specialty Comments:  ?No specialty comments available. ? ?Imaging: ?No results found. ? ? ?PMFS History: ?Patient Active Problem List  ? Diagnosis Date Noted  ? Primary osteoarthritis of left knee 05/30/2016  ? S/P total knee replacement using  cement, right 04/12/2016  ? Fibromyalgia 01/22/2016  ? Primary osteoarthritis of both hands 01/22/2016  ? Essential hypertension 01/22/2016  ? History of coronary artery disease 01/22/2016  ? History of Graves' disease 01/22/2016  ? Dyslipidemia 01/22/2016  ? History of thalassemia minor 01/22/2016  ? ?Past Medical History:  ?Diagnosis Date  ? Abnormally small mouth   ? Complication of anesthesia   ? Coronary artery disease   ? Degenerative joint disease of hand 12/2012  ? right and left small fingers  ? Difficult intubation 07/2012  ? Fibromyalgia   ? Headache   ? migraines  ? High cholesterol   ? History of Graves' disease   ? History of kidney stones   ? History of migraine   ? Hypertension   ? under control with med., has been on med. x 4 yr.  ? Hyperthyroidism   ? Hypothyroidism   ? Kidney stones   ? Kidney stones   ? per patient   ? Osteoarthritis   ? S/P coronary artery stent placement 04/2011  ? states right coronary artery  ? Thalassemia minor   ?  ?Family History  ?Problem Relation Age of Onset  ? Heart disease Mother   ? Arthritis Mother   ? Cancer Sister   ? Seizures Sister   ? Arthritis Brother   ? Diabetes Brother   ?  ?Past Surgical History:  ?Procedure Laterality Date  ? ABDOMINAL HERNIA REPAIR  04/2012  ? ABDOMINAL SURGERY  01/2011  ? for intestinal blockage  ? APPENDECTOMY  1984  ? CARDIAC CATHETERIZATION    ? CATARACT EXTRACTION W/ INTRAOCULAR LENS IMPLANT Left 05/2020  ? CHOLECYSTECTOMY  07/2012  ? COLON SURGERY    ? COLONOSCOPY    ? CORONARY ANGIOPLASTY  04/2011  ? stent x 1  ? DILATION AND CURETTAGE OF UTERUS    ? 1989  ? DISTAL INTERPHALANGEAL JOINT FUSION Bilateral 12/25/2012  ? Procedure: DISTAL INTERPHALANGEAL JOINT FUSION RIGHT AND LEFT SMALL FINGER;  Surgeon: Cammie Sickle., MD;  Location: [place];  Service: Orthopedics;  Laterality: Bilateral;  left and right small finger  ? ESOPHAGOGASTRODUODENOSCOPY    ? EXCISION MASS LOWER EXTREMETIES Left 11/15/2018  ? Procedure:  LEFT FOOT DEEP MASS EXCISION;  Surgeon: Erle Crocker, MD;  Location: [place];  Service: Orthopedics;  Laterality: Left;  SURGERY REQUEST TIME 1.5 HOURS  ? FINGER SURGERY    ? HAND SURGERY  2014  ? Bilateral pinky finger distal joint fusion  ? HAND SURGERY  2016  ? Right Thumb reconstruction and left middle finger distal joint fusion  ? HAND SURGERY    ? Scar tissue removal right palm  ? HERNIA REPAIR    ? JOINT REPLACEMENT    ? right kne total knee replacement  ? KNEE ARTHROPLASTY    ? OVARIAN CYST REMOVAL    ? TOTAL  KNEE ARTHROPLASTY Right 03/29/2016  ? TOTAL KNEE ARTHROPLASTY Right 03/29/2016  ? Procedure: TOTAL KNEE ARTHROPLASTY;  Surgeon: Garald Balding, MD;  Location: [place];  Service: Orthopedics;  Laterality: Right;  ? TUBAL LIGATION Bilateral 1993  ? WISDOM TOOTH EXTRACTION    ? ?Social History  ? ?Occupational History  ? Not on file  ?Tobacco Use  ? Smoking status: Never  ? Smokeless tobacco: Never  ?Vaping Use  ? Vaping Use: Never used  ?Substance and Sexual Activity  ? Alcohol use: No  ? Drug use: No  ? Sexual activity: Not on file  ? ? ? ? ? ? ?

## 2021-05-24 ENCOUNTER — Telehealth: Payer: Self-pay | Admitting: Orthopaedic Surgery

## 2021-05-24 NOTE — Telephone Encounter (Signed)
Patient called. She would like to know the status of her gel injection. Her call back number is [phone]

## 2021-05-24 NOTE — Telephone Encounter (Signed)
Talked with patient and advised her that I am currently awaiting approval from insurance.  Patient voiced that she understands.  Appointments have been scheduled.

## 2021-05-28 ENCOUNTER — Telehealth: Payer: Self-pay

## 2021-05-28 ENCOUNTER — Other Ambulatory Visit: Payer: Self-pay

## 2021-05-28 DIAGNOSIS — M1712 Unilateral primary osteoarthritis, left knee: Secondary | ICD-10-CM

## 2021-05-28 NOTE — Telephone Encounter (Signed)
Talked with Anderson Malta at [facility] and initiated PA for Orthovisc, left knee. Faxed clinical note to [phone], Attn: Anderson Malta

## 2021-05-28 NOTE — Telephone Encounter (Signed)
Received call from [place] with [employer] and was advised that patient has been approved for Orthovisc, left knee.  Check referrals tab

## 2021-06-01 ENCOUNTER — Ambulatory Visit (INDEPENDENT_AMBULATORY_CARE_PROVIDER_SITE_OTHER): Payer: PRIVATE HEALTH INSURANCE | Admitting: Orthopaedic Surgery

## 2021-06-01 ENCOUNTER — Encounter: Payer: Self-pay | Admitting: Orthopaedic Surgery

## 2021-06-01 DIAGNOSIS — M1712 Unilateral primary osteoarthritis, left knee: Secondary | ICD-10-CM | POA: Diagnosis not present

## 2021-06-01 MED ORDER — HYALURONAN 30 MG/2ML IX SOSY
30.0000 mg | PREFILLED_SYRINGE | INTRA_ARTICULAR | Status: AC | PRN
  Administered 2021-06-01: 30 mg via INTRA_ARTICULAR

## 2021-06-01 NOTE — Progress Notes (Signed)
Office Visit Note   Patient: Shannon Jennings           Date of Birth: 1960/05/29           MRN: [id number] Visit Date: 06/01/2021              Requested by: Keane Police, MD [address], [place] [address] PCP: Keane Police, MD   Assessment & Plan: Visit Diagnoses: Left knee pain  Plan: Patient is here for her first Orthovisc injection into her left knee.  She is status post right knee replacement and prior to that had viscosupplementation in that knee and seem to help.  She has had cortisone injections in the left knee which were short-lived but no gel injections  Follow-Up Instructions: No follow-ups on file.   Orders:  No orders of the defined types were placed in this encounter.  No orders of the defined types were placed in this encounter.     Procedures: Large Joint Inj on 06/01/2021 3:21 PM Indications: pain and diagnostic evaluation Details: 25 G 1.5 in needle, anteromedial approach  Arthrogram: No  Medications: 30 mg Hyaluronan 30 MG/2ML Outcome: tolerated well, no immediate complications Procedure, treatment alternatives, risks and benefits explained, specific risks discussed. Consent was given by the patient.     Clinical Data: No additional findings.   Subjective: No chief complaint on file. Patient presents today for Orthovisc of left knee. This is the first orthovisc injection into her left knee.   HPI  Review of Systems  All other systems reviewed and are negative.   Objective: Vital Signs: There were no vitals taken for this visit.  Physical Exam Patient is alert and awake comfortable sitting in a chair Ortho Exam Left knee no effusion no redness no cellulitis. Specialty Comments:  No specialty comments available.  Imaging: No results found.   PMFS History: Patient Active Problem List   Diagnosis Date Noted   Primary osteoarthritis of left knee 05/30/2016   S/P total knee replacement using cement, right  04/12/2016   Fibromyalgia 01/22/2016   Primary osteoarthritis of both hands 01/22/2016   Essential hypertension 01/22/2016   History of coronary artery disease 01/22/2016   History of Graves' disease 01/22/2016   Dyslipidemia 01/22/2016   History of thalassemia minor 01/22/2016   Past Medical History:  Diagnosis Date   Abnormally small mouth    Complication of anesthesia    Coronary artery disease    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 x 4 yr.   Hyperthyroidism    Hypothyroidism    Kidney stones    Kidney stones    per patient    Osteoarthritis    S/P coronary artery stent placement 04/2011   states right coronary artery   Thalassemia minor     Family History  Problem Relation Age of Onset   Heart disease Mother    Arthritis Mother    Cancer Sister    Seizures Sister    Arthritis Brother    Diabetes Brother     Past Surgical History:  Procedure Laterality Date   ABDOMINAL HERNIA REPAIR  04/2012  ABDOMINAL SURGERY  01/2011   for intestinal blockage   APPENDECTOMY  1984   CARDIAC CATHETERIZATION     CATARACT EXTRACTION W/ INTRAOCULAR LENS IMPLANT Left 05/2020   CHOLECYSTECTOMY  07/2012   COLON SURGERY     COLONOSCOPY     CORONARY ANGIOPLASTY  04/2011   stent x 1   DILATION AND CURETTAGE OF UTERUS     1989   DISTAL INTERPHALANGEAL JOINT FUSION Bilateral 12/25/2012   Procedure: DISTAL INTERPHALANGEAL JOINT FUSION RIGHT AND LEFT SMALL FINGER;  Surgeon: Cammie Sickle., MD;  [facility];  Service: Orthopedics;  Laterality: Bilateral;  left and right small finger   ESOPHAGOGASTRODUODENOSCOPY     EXCISION MASS LOWER EXTREMETIES Left 11/15/2018   Procedure: LEFT FOOT  DEEP MASS EXCISION;  Surgeon: Erle Crocker, MD;  Location: [facility];  Service: Orthopedics;  Laterality: Left;  SURGERY REQUEST TIME 1.5 HOURS   FINGER SURGERY     HAND SURGERY  2014   Bilateral pinky finger distal joint fusion   HAND SURGERY  2016   Right Thumb reconstruction and left middle finger distal joint fusion   HAND SURGERY     Scar tissue removal right palm   HERNIA REPAIR     JOINT REPLACEMENT     right kne total knee replacement   KNEE ARTHROPLASTY     OVARIAN CYST REMOVAL     TOTAL KNEE ARTHROPLASTY Right 03/29/2016   TOTAL KNEE ARTHROPLASTY Right 03/29/2016   Procedure: TOTAL KNEE ARTHROPLASTY;  Surgeon: Garald Balding, MD;  Location: [facility];  Service: Orthopedics;  Laterality: Right;   TUBAL LIGATION Bilateral 1993   WISDOM TOOTH EXTRACTION     Social History   Occupational History   Not on file  Tobacco Use   Smoking status: Never   Smokeless tobacco: Never  Vaping Use   Vaping Use: Never used  Substance and Sexual Activity   Alcohol use: No   Drug use: No   Sexual activity: Not on file

## 2021-06-10 ENCOUNTER — Ambulatory Visit: Payer: PRIVATE HEALTH INSURANCE | Admitting: Physician Assistant

## 2021-06-24 ENCOUNTER — Ambulatory Visit (INDEPENDENT_AMBULATORY_CARE_PROVIDER_SITE_OTHER): Payer: PRIVATE HEALTH INSURANCE | Admitting: Orthopaedic Surgery

## 2021-06-24 ENCOUNTER — Encounter: Payer: Self-pay | Admitting: Orthopaedic Surgery

## 2021-06-24 DIAGNOSIS — M1712 Unilateral primary osteoarthritis, left knee: Secondary | ICD-10-CM | POA: Diagnosis not present

## 2021-06-24 MED ORDER — HYALURONAN 30 MG/2ML IX SOSY
30.0000 mg | PREFILLED_SYRINGE | INTRA_ARTICULAR | Status: AC | PRN
  Administered 2021-06-24: 30 mg via INTRA_ARTICULAR

## 2021-06-24 NOTE — Progress Notes (Signed)
Office Visit Note   Patient: Shannon Jennings           Date of Birth: September 19, 1960           MRN: [id number] Visit Date: 06/24/2021              Requested by: Keane Police, MD [address] [place] [address] PCP: Keane Police, MD   Assessment & Plan: Visit Diagnoses:  1. Arthritis of left knee     Plan patient is here for her second Orthovisc injection into her left knee.  She tolerated the first 1 quite well.  She is having a lot of trouble with general pain secondary to arthritis and fibromyalgia.  She is asking if something can be called in for her till she sees her primary care doctor. Discussed Celebrex but she has a sulfa allergy  We will follow-up in 1 week for her final injection into her left knee  Follow-Up Instructions: Return in about 1 week (around 07/01/2021).   Orders:  No orders of the defined types were placed in this encounter.  No orders of the defined types were placed in this encounter.     Procedures: Large Joint Inj: L knee on 06/24/2021 1:35 PM Indications: pain and diagnostic evaluation Details: 25 G 1.5 in needle, anteromedial approach  Arthrogram: No  Medications: 30 mg Hyaluronan 30 MG/2ML Outcome: tolerated well, no immediate complications Procedure, treatment alternatives, risks and benefits explained, specific risks discussed. Consent was given by the patient.     Clinical Data: No additional findings.   Subjective: Chief Complaint  Patient presents with   Left Knee - Follow-up   Patient presents today for Orthovisc #2 in her left knee. She states that she has been having aching pains however feels like this is due to the rainy weather. Patient is currently taking tramadol and OTC tylenol for pain relief which she states is slightly helping. At this time she is currently not wearing any knee brace.   Review of Systems   Objective: Vital Signs: There were no vitals taken for this visit.  Physical  Exam Constitutional:      Appearance: Normal appearance.  Pulmonary:     Effort: Pulmonary effort is normal.  Neurological:     Mental Status: She is alert.     Ortho Exam Examination of her left knee she has no redness cellulitis or effusion. Specialty Comments:  No specialty comments available.  Imaging: No results found.   PMFS History: Patient Active Problem List   Diagnosis Date Noted   Arthritis of left knee 05/30/2016   S/P total knee replacement using cement, right 04/12/2016   Fibromyalgia 01/22/2016   Primary osteoarthritis of both hands 01/22/2016   Essential hypertension 01/22/2016   History of coronary artery disease 01/22/2016   History of Graves' disease 01/22/2016   Dyslipidemia 01/22/2016   History of thalassemia minor 01/22/2016   Past Medical History:  Diagnosis Date   Abnormally small mouth    Complication of anesthesia    Coronary artery disease    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 x 4 yr.  Hyperthyroidism    Hypothyroidism    Kidney stones    Kidney stones    per patient    Osteoarthritis    S/P coronary artery stent placement 04/2011   states right coronary artery   Thalassemia minor     Family History  Problem Relation Age of Onset   Heart disease Mother    Arthritis Mother    Cancer Sister    Seizures Sister    Arthritis Brother    Diabetes Brother     Past Surgical History:  Procedure Laterality Date   ABDOMINAL HERNIA REPAIR  04/2012   ABDOMINAL SURGERY  01/2011   for intestinal blockage   APPENDECTOMY  1984   CARDIAC CATHETERIZATION     CATARACT EXTRACTION W/ INTRAOCULAR LENS IMPLANT Left 05/2020   CHOLECYSTECTOMY  07/2012   COLON SURGERY      COLONOSCOPY     CORONARY ANGIOPLASTY  04/2011   stent x 1   DILATION AND CURETTAGE OF UTERUS     1989   DISTAL INTERPHALANGEAL JOINT FUSION Bilateral 12/25/2012   Procedure: DISTAL INTERPHALANGEAL JOINT FUSION RIGHT AND LEFT SMALL FINGER;  Surgeon: Cammie Sickle., MD;  Location: [facility];  Service: Orthopedics;  Laterality: Bilateral;  left and right small finger   ESOPHAGOGASTRODUODENOSCOPY     EXCISION MASS LOWER EXTREMETIES Left 11/15/2018   Procedure: LEFT FOOT DEEP MASS EXCISION;  Surgeon: Erle Crocker, MD;  Location: [facility];  Service: Orthopedics;  Laterality: Left;  SURGERY REQUEST TIME 1.5 HOURS   FINGER SURGERY     HAND SURGERY  2014   Bilateral pinky finger distal joint fusion   HAND SURGERY  2016   Right Thumb reconstruction and left middle finger distal joint fusion   HAND SURGERY     Scar tissue removal right palm   HERNIA REPAIR     JOINT REPLACEMENT     right kne total knee replacement   KNEE ARTHROPLASTY     OVARIAN CYST REMOVAL     TOTAL KNEE ARTHROPLASTY Right 03/29/2016   TOTAL KNEE ARTHROPLASTY Right 03/29/2016   Procedure: TOTAL KNEE ARTHROPLASTY;  Surgeon: Garald Balding, MD;  Location: [facility];  Service: Orthopedics;  Laterality: Right;   TUBAL LIGATION Bilateral 1993   WISDOM TOOTH EXTRACTION     Social History   Occupational History   Not on file  Tobacco Use   Smoking status: Never   Smokeless tobacco: Never  Vaping Use   Vaping Use: Never used  Substance and Sexual Activity   Alcohol use: No   Drug use: No   Sexual activity: Not on file

## 2021-06-25 ENCOUNTER — Telehealth: Payer: Self-pay | Admitting: Physician Assistant

## 2021-06-25 ENCOUNTER — Other Ambulatory Visit: Payer: Self-pay | Admitting: Physician Assistant

## 2021-06-25 MED ORDER — CELECOXIB 200 MG PO CAPS: 200.0000 mg | ORAL_CAPSULE | Freq: Every day | ORAL | 1 refills | Status: DC

## 2021-06-29 ENCOUNTER — Encounter: Payer: Self-pay | Admitting: Orthopaedic Surgery

## 2021-06-29 ENCOUNTER — Ambulatory Visit (INDEPENDENT_AMBULATORY_CARE_PROVIDER_SITE_OTHER): Payer: PRIVATE HEALTH INSURANCE | Admitting: Orthopaedic Surgery

## 2021-06-29 DIAGNOSIS — M1712 Unilateral primary osteoarthritis, left knee: Secondary | ICD-10-CM

## 2021-06-29 MED ORDER — HYALURONAN 30 MG/2ML IX SOSY
30.0000 mg | PREFILLED_SYRINGE | INTRA_ARTICULAR | Status: AC | PRN
  Administered 2021-06-29: 30 mg via INTRA_ARTICULAR

## 2021-06-29 NOTE — Progress Notes (Signed)
Office Visit Note   Patient: LEIGH-ANN SKENDER           Date of Birth: 1960/11/05           MRN: [id number] Visit Date: 06/29/2021              Requested by: Glori Bickers, MD [address], [address] [place],  [place] [address] PCP: Glori Bickers, MD   Assessment & Plan: Visit Diagnoses:  1. Arthritis of left knee     Plan: Third and final Orthovisc injection left knee.  Mrs. Cederquist is that it has made a difference.  Follow-Up Instructions: Return if symptoms worsen or fail to improve.   Orders:  No orders of the defined types were placed in this encounter.  No orders of the defined types were placed in this encounter.     Procedures: Large Joint Inj: L knee on 06/29/2021 2:04 PM Indications: pain and joint swelling Details: 25 G 1.5 in needle, anteromedial approach  Arthrogram: No  Medications: 30 mg Hyaluronan 30 MG/2ML Outcome: tolerated well, no immediate complications Procedure, treatment alternatives, risks and benefits explained, specific risks discussed. Consent was given by the patient. Immediately prior to procedure a time out was called to verify the correct patient, procedure, equipment, support staff and site/side marked as required. Patient was prepped and draped in the usual sterile fashion.       Clinical Data: No additional findings.   Subjective: Chief Complaint  Patient presents with   Left Knee - Follow-up   Patient presents today for Orthovisc #3 in her left knee. Patient states that pain has gradually getting better since she has started celebrex medication.   Review of Systems   Objective: Vital Signs: There were no vitals taken for this visit.  Physical Exam  Ortho Exam left knee was not hot red warm or swollen.  No effusion.  Specialty Comments:  No specialty comments available.  Imaging: No results found.   PMFS History: Patient Active Problem List   Diagnosis Date Noted   Arthritis of left knee 05/30/2016   S/P total  knee replacement using cement, right 04/12/2016   Fibromyalgia 01/22/2016   Primary osteoarthritis of both hands 01/22/2016   Essential hypertension 01/22/2016   History of coronary artery disease 01/22/2016   History of Graves' disease 01/22/2016   Dyslipidemia 01/22/2016   History of thalassemia minor 01/22/2016   Past Medical History:  Diagnosis Date   Abnormally small mouth    Complication of anesthesia    Coronary artery disease    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 x 4 yr.   Hyperthyroidism    Hypothyroidism    Kidney stones    Kidney stones    per patient    Osteoarthritis    S/P coronary artery stent placement 04/2011   states right coronary artery   Thalassemia minor     Family History  Problem Relation Age of Onset   Heart disease Mother    Arthritis Mother    Cancer Sister    Seizures Sister    Arthritis Brother    Diabetes Brother  Past Surgical History:  Procedure Laterality Date   ABDOMINAL HERNIA REPAIR  04/2012   ABDOMINAL SURGERY  01/2011   for intestinal blockage   APPENDECTOMY  1984   CARDIAC CATHETERIZATION     CATARACT EXTRACTION W/ INTRAOCULAR LENS IMPLANT Left 05/2020   CHOLECYSTECTOMY  07/2012   COLON SURGERY     COLONOSCOPY     CORONARY ANGIOPLASTY  04/2011   stent x 1   DILATION AND CURETTAGE OF UTERUS     1989   DISTAL INTERPHALANGEAL JOINT FUSION Bilateral 12/25/2012   Procedure: DISTAL INTERPHALANGEAL JOINT FUSION RIGHT AND LEFT SMALL FINGER;  Surgeon: Wyn Forster., MD;  Location: [facility];  Service: Orthopedics;  Laterality: Bilateral;  left and right small finger   ESOPHAGOGASTRODUODENOSCOPY     EXCISION MASS LOWER EXTREMETIES Left  11/15/2018   Procedure: LEFT FOOT DEEP MASS EXCISION;  Surgeon: Terance Hart, MD;  Location: [facility];  Service: Orthopedics;  Laterality: Left;  SURGERY REQUEST TIME 1.5 HOURS   FINGER SURGERY     HAND SURGERY  2014   Bilateral pinky finger distal joint fusion   HAND SURGERY  2016   Right Thumb reconstruction and left middle finger distal joint fusion   HAND SURGERY     Scar tissue removal right palm   HERNIA REPAIR     JOINT REPLACEMENT     right kne total knee replacement   KNEE ARTHROPLASTY     OVARIAN CYST REMOVAL     TOTAL KNEE ARTHROPLASTY Right 03/29/2016   TOTAL KNEE ARTHROPLASTY Right 03/29/2016   Procedure: TOTAL KNEE ARTHROPLASTY;  Surgeon: Valeria Batman, MD;  Location[facility];  Service: Orthopedics;  Laterality: Right;   TUBAL LIGATION Bilateral 1993   WISDOM TOOTH EXTRACTION     Social History   Occupational History   Not on file  Tobacco Use   Smoking status: Never   Smokeless tobacco: Never  Vaping Use   Vaping Use: Never used  Substance and Sexual Activity   Alcohol use: No   Drug use: No   Sexual activity: Not on file

## 2021-07-01 ENCOUNTER — Ambulatory Visit: Payer: PRIVATE HEALTH INSURANCE | Admitting: Orthopaedic Surgery

## 2021-10-27 ENCOUNTER — Ambulatory Visit: Payer: PRIVATE HEALTH INSURANCE | Admitting: Orthopaedic Surgery

## 2021-11-02 ENCOUNTER — Encounter: Payer: Self-pay | Admitting: Orthopaedic Surgery

## 2021-11-02 ENCOUNTER — Ambulatory Visit: Payer: PRIVATE HEALTH INSURANCE | Admitting: Orthopaedic Surgery

## 2021-11-02 DIAGNOSIS — M1712 Unilateral primary osteoarthritis, left knee: Secondary | ICD-10-CM | POA: Diagnosis not present

## 2021-11-02 NOTE — Progress Notes (Signed)
Office Visit Note   Patient: Shannon Jennings           Date of Birth: 22-Dec-1960           MRN: [id number] Visit Date: 11/02/2021              Requested by: Keane Police, MD [address] [place] [address] PCP: Keane Police, MD   Assessment & Plan: Visit Diagnoses:  1. Arthritis of left knee     Plan: Shannon Jennings has end-stage osteoarthritis in her left knee.  Over the course of several years she has tried cortisone which elevates her blood sugar, and viscosupplementation.  She is tried a number of over-the-counter medicines.  She wears a support on her knee and still finds it difficult to perform a lot of her activities of daily living.  She would like to pursue a total knee replacement.  She had a successful right total knee replacement about 4 to 5 years ago.  Films of the left knee in the standing projection earlier in the year demonstrated bone-on-bone in the medial compartment with degenerative changes in the lateral compartment and patellofemoral joint as well.  The alignment was neutral. Long discussion today regarding the above.  I will refer her to Dr. Ninfa Linden  Follow-Up Instructions: Return Refer to Dr. Ninfa Linden for left total knee replacement.   Orders:  No orders of the defined types were placed in this encounter.  No orders of the defined types were placed in this encounter.     Procedures: No procedures performed   Clinical Data: No additional findings.   Subjective: Chief Complaint  Patient presents with   Left Knee - Follow-up  Shannon Jennings has end-stage osteoarthritis of her left knee.  I have been following her for a number of years.  She has had a successful right total knee replacement and is tried cortisone which elevates her blood sugar and even viscosupplementation in the left knee.  She continues to have problem to the point of compromise.  She wants to discuss total knee replacement.  Her blood sugars have been under control with  her last hemoglobin A1c in the mid fives.  HPI  Review of Systems   Objective: Vital Signs: There were no vitals taken for this visit.  Physical Exam Constitutional:      Appearance: She is well-developed.  Eyes:     Pupils: Pupils are equal, round, and reactive to light.  Pulmonary:     Effort: Pulmonary effort is normal.  Skin:    General: Skin is warm and dry.  Neurological:     Mental Status: She is alert and oriented to person, place, and time.  Psychiatric:        Behavior: Behavior normal.     Ortho Exam awake alert and oriented x3.  Comfortable sitting.  Left knee was not hot red warm or swollen.  No effusion.  Predominately medial joint pain.  Alignment is neutral.  No opening with varus valgus stress.  Full extension flex to 120 degrees.  No popliteal pain or mass.  No calf pain.  Feet were warm.  +1 pulses.  Straight leg raise negative  Specialty Comments:  No specialty comments available.  Imaging: No results found.   PMFS History: Patient Active Problem List   Diagnosis Date Noted   Arthritis of left knee 05/30/2016   S/P total knee replacement using cement, right 04/12/2016   Fibromyalgia 01/22/2016   Primary osteoarthritis of  both hands 01/22/2016   Essential hypertension 01/22/2016   History of coronary artery disease 01/22/2016   History of Graves' disease 01/22/2016   Dyslipidemia 01/22/2016   History of thalassemia minor 01/22/2016   Past Medical History:  Diagnosis Date   Abnormally small mouth    Complication of anesthesia    Coronary artery disease    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 x 4 yr.   Hyperthyroidism    Hypothyroidism    Kidney stones    Kidney stones     per patient    Osteoarthritis    S/P coronary artery stent placement 04/2011   states right coronary artery   Thalassemia minor     Family History  Problem Relation Age of Onset   Heart disease Mother    Arthritis Mother    Cancer Sister    Seizures Sister    Arthritis Brother    Diabetes Brother     Past Surgical History:  Procedure Laterality Date   ABDOMINAL HERNIA REPAIR  04/2012   ABDOMINAL SURGERY  01/2011   for intestinal blockage   APPENDECTOMY  1984   CARDIAC CATHETERIZATION     CATARACT EXTRACTION W/ INTRAOCULAR LENS IMPLANT Left 05/2020   CHOLECYSTECTOMY  07/2012   COLON SURGERY     COLONOSCOPY     CORONARY ANGIOPLASTY  04/2011   stent x 1   DILATION AND CURETTAGE OF UTERUS     1989   DISTAL INTERPHALANGEAL JOINT FUSION Bilateral 12/25/2012   Procedure: DISTAL INTERPHALANGEAL JOINT FUSION RIGHT AND LEFT SMALL FINGER;  Surgeon: Cammie Sickle., MD;  Location: [facility];  Service: Orthopedics;  Laterality: Bilateral;  left and right small finger   ESOPHAGOGASTRODUODENOSCOPY     EXCISION MASS LOWER EXTREMETIES Left 11/15/2018   Procedure: LEFT FOOT DEEP MASS EXCISION;  Surgeon: Erle Crocker, MD;  Location: [facility];  Service: Orthopedics;  Laterality: Left;  SURGERY REQUEST TIME 1.5 HOURS   FINGER SURGERY     HAND SURGERY  2014   Bilateral pinky finger distal joint fusion   HAND SURGERY  2016   Right Thumb reconstruction and left middle finger distal joint fusion   HAND SURGERY     Scar tissue removal right palm   HERNIA REPAIR     JOINT REPLACEMENT     right kne total knee replacement   KNEE ARTHROPLASTY     OVARIAN CYST REMOVAL     TOTAL KNEE ARTHROPLASTY Right 03/29/2016   TOTAL KNEE ARTHROPLASTY Right 03/29/2016   Procedure: TOTAL KNEE ARTHROPLASTY;  Surgeon: Garald Balding, MD;  Location: [facility];  Service: Orthopedics;  Laterality: Right;   TUBAL LIGATION Bilateral 1993   WISDOM TOOTH EXTRACTION     Social History    Occupational History   Not on file  Tobacco Use   Smoking status: Never   Smokeless tobacco: Never  Vaping Use   Vaping Use: Never used  Substance and Sexual Activity   Alcohol use: No   Drug use: No   Sexual activity: Not on file     Garald Balding, MD   Note -  This record has been created using [employer].  Chart creation errors have been sought, but may not always  have been located. Such creation errors do not reflect on  the standard of medical care.

## 2021-11-10 ENCOUNTER — Ambulatory Visit (INDEPENDENT_AMBULATORY_CARE_PROVIDER_SITE_OTHER): Payer: PRIVATE HEALTH INSURANCE | Admitting: Orthopaedic Surgery

## 2021-11-10 ENCOUNTER — Encounter: Payer: Self-pay | Admitting: Orthopaedic Surgery

## 2021-11-10 DIAGNOSIS — G8929 Other chronic pain: Secondary | ICD-10-CM | POA: Diagnosis not present

## 2021-11-10 DIAGNOSIS — M1712 Unilateral primary osteoarthritis, left knee: Secondary | ICD-10-CM | POA: Diagnosis not present

## 2021-11-10 DIAGNOSIS — M25562 Pain in left knee: Secondary | ICD-10-CM

## 2021-11-10 NOTE — Progress Notes (Signed)
The patient is a long-term patient of Dr. Durward Fortes who has well-documented severe end-stage arthritis of her left knee.  He actually replaced her right knee successfully in 2018.  That knee is done incredibly well for her.  She is an active 61 year old female.  She works as a Nurse, adult.  At this point her left knee pain is daily and it is detrimentally affecting her mobility, her quality of life and actives daily living.  She has tried and failed all forms conservative treatment for over a year.  She says her original knee issues came from an injury after a mechanical fall years ago.  She is very active and thin individual.  She says the pain in her knee does wake her up at night where she has to get in a recliner.  She is on rheumatoid medications as well as Celebrex.  I was able to review all of her records within epic.  There is no listed fever, chills, nausea, vomiting.  When she does get up from a sitting position she has significant symptoms and pain in that left knee.  X-rays of the left knee reviewed with her show complete loss of medial joint space and patellofemoral joint.  There is slight varus malalignment and bone-on-bone wear in the knee.  There are osteophytes in all 3 compartments.  Examination of her left knee shows significant medial joint line tenderness of the left knee with patellofemoral crepitation.  There is pain throughout the arc of motion of her left knee.  It is ligamentously stable.  The varus malalignment is correctable.  We talked in length in detail about knee replacement surgery.  Having had this done before she is fully aware of the risk and benefits of the surgery and what to expect from an intraoperative and postoperative standpoint.  All question concerns were answered and addressed.  We will work on getting this scheduled hopefully by the end of the year if possible.

## 2021-12-08 ENCOUNTER — Other Ambulatory Visit: Payer: Self-pay

## 2021-12-21 NOTE — Progress Notes (Signed)
Pt. Needs orders for upcoming surgery. ?

## 2021-12-21 NOTE — Patient Instructions (Signed)
DUE TO COVID-19 ONLY TWO VISITORS  (aged 62 and older)  ARE ALLOWED TO COME WITH YOU AND STAY IN THE WAITING ROOM ONLY DURING PRE OP AND PROCEDURE.   **NO VISITORS ARE ALLOWED IN THE SHORT STAY AREA OR RECOVERY ROOM!!**  IF YOU WILL BE ADMITTED INTO THE HOSPITAL YOU ARE ALLOWED ONLY FOUR SUPPORT PEOPLE DURING VISITATION HOURS ONLY (7 AM -8PM)   The support person(s) must pass our screening, gel in and out, and wear a mask at all times, including in the patient's room. Patients must also wear a mask when staff or their support person are in the room. Visitors GUEST BADGE MUST BE WORN VISIBLY  One adult visitor may remain with you overnight and MUST be in the room by 8 P.M.     Your procedure is scheduled on: 12/31/21   Report to [facility] Main Entrance    Report to admitting at : 7:30 AM   Call this number if you have problems the morning of surgery [phone]   Do not eat food :After Midnight.   After Midnight you may have the following liquids until : 7:00 AM DAY OF SURGERY  Water Black Coffee (sugar ok, NO MILK/CREAM OR CREAMERS)  Tea (sugar ok, NO MILK/CREAM OR CREAMERS) regular and decaf                             Plain Jell-O (NO RED)                                           Fruit ices (not with fruit pulp, NO RED)                                     Popsicles (NO RED)                                                                  Juice: apple, WHITE grape, WHITE cranberry Sports drinks like Gatorade (NO RED)   The day of surgery:  Drink ONE (1) Pre-Surgery Clear Ensure or G2 at: 7:00 AM the morning of surgery. Drink in one sitting. Do not sip.  This drink was given to you during your hospital  pre-op appointment visit. Nothing else to drink after completing the  Pre-Surgery Clear Ensure or G2.          If you have questions, please contact your surgeon's office   Oral Hygiene is also important to reduce your risk of infection.                                     Remember - BRUSH YOUR TEETH THE MORNING OF SURGERY WITH YOUR REGULAR TOOTHPASTE  DENTURES WILL BE REMOVED PRIOR TO SURGERY PLEASE DO NOT APPLY "Poly grip" OR ADHESIVES!!!   Do NOT smoke after Midnight   Take these medicines the morning of surgery with A SIP OF WATER: carvedilol,duloxetine,levothyroxine.Imitrex as needed.  You may not have any metal on your body including hair pins, jewelry, and body piercing             Do not wear make-up, lotions, powders, perfumes/cologne, or deodorant  Do not wear nail polish including gel and S&S, artificial/acrylic nails, or any other type of covering on natural nails including finger and toenails. If you have artificial nails, gel coating, etc. that needs to be removed by a nail salon please have this removed prior to surgery or surgery may need to be canceled/ delayed if the surgeon/ anesthesia feels like they are unable to be safely monitored.   Do not shave  48 hours prior to surgery. .   Do not bring valuables to the hospital. [place].   Contacts, glasses, or bridgework may not be worn into surgery.   Bring small overnight bag day of surgery.   DO NOT [place]. PHARMACY WILL DISPENSE MEDICATIONS LISTED ON YOUR MEDICATION LIST TO YOU DURING YOUR ADMISSION [place]!    Patients discharged on the day of surgery will not be allowed to drive home.  Someone NEEDS to stay with you for the first 24 hours after anesthesia.   Special Instructions: Bring a copy of your healthcare power of attorney and living will documents         the day of surgery if you haven't scanned them before.              Please read over the following fact sheets you were given: IF YOU HAVE QUESTIONS ABOUT YOUR PRE-OP INSTRUCTIONS PLEASE CALL [phone]    [facility] - Preparing for Surgery Before surgery, you can play an important role.   Because skin is not sterile, your skin needs to be as free of germs as possible.  You can reduce the number of germs on your skin by washing with CHG (chlorahexidine gluconate) soap before surgery.  CHG is an antiseptic cleaner which kills germs and bonds with the skin to continue killing germs even after washing. Please DO NOT use if you have an allergy to CHG or antibacterial soaps.  If your skin becomes reddened/irritated stop using the CHG and inform your nurse when you arrive at Short Stay. Do not shave (including legs and underarms) for at least 48 hours prior to the first CHG shower.  You may shave your face/neck. Please follow these instructions carefully:  1.  Shower with CHG Soap the night before surgery and the  morning of Surgery.  2.  If you choose to wash your hair, wash your hair first as usual with your  normal  shampoo.  3.  After you shampoo, rinse your hair and body thoroughly to remove the  shampoo.                           4.  Use CHG as you would any other liquid soap.  You can apply chg directly  to the skin and wash                       Gently with a scrungie or clean washcloth.  5.  Apply the CHG Soap to your body ONLY FROM THE NECK DOWN.   Do not use on face/ open  Wound or open sores. Avoid contact with eyes, ears mouth and genitals (private parts).                       Wash face,  Genitals (private parts) with your normal soap.             6.  Wash thoroughly, paying special attention to the area where your surgery  will be performed.  7.  Thoroughly rinse your body with warm water from the neck down.  8.  DO NOT shower/wash with your normal soap after using and rinsing off  the CHG Soap.                9.  Pat yourself dry with a clean towel.            10.  Wear clean pajamas.            11.  Place clean sheets on your bed the night of your first shower and do not  sleep with pets. Day of Surgery : Do not apply any lotions/deodorants the  morning of surgery.  Please wear clean clothes to the hospital/surgery center.  FAILURE TO FOLLOW THESE INSTRUCTIONS MAY RESULT IN THE CANCELLATION OF YOUR SURGERY PATIENT SIGNATURE_________________________________  NURSE SIGNATURE__________________________________  ________________________________________________________________________   Shannon Jennings  An incentive spirometer is a tool that can help keep your lungs clear and active. This tool measures how well you are filling your lungs with each breath. Taking long deep breaths may help reverse or decrease the chance of developing breathing (pulmonary) problems (especially infection) following: A long period of time when you are unable to move or be active. BEFORE THE PROCEDURE  If the spirometer includes an indicator to show your best effort, your nurse or respiratory therapist will set it to a desired goal. If possible, sit up straight or lean slightly forward. Try not to slouch. Hold the incentive spirometer in an upright position. INSTRUCTIONS FOR USE  Sit on the edge of your bed if possible, or sit up as far as you can in bed or on a chair. Hold the incentive spirometer in an upright position. Breathe out normally. Place the mouthpiece in your mouth and seal your lips tightly around it. Breathe in slowly and as deeply as possible, raising the piston or the ball toward the top of the column. Hold your breath for 3-5 seconds or for as long as possible. Allow the piston or ball to fall to the bottom of the column. Remove the mouthpiece from your mouth and breathe out normally. Rest for a few seconds and repeat Steps 1 through 7 at least 10 times every 1-2 hours when you are awake. Take your time and take a few normal breaths between deep breaths. The spirometer may include an indicator to show your best effort. Use the indicator as a goal to work toward during each repetition. After each set of 10 deep breaths, practice  coughing to be sure your lungs are clear. If you have an incision (the cut made at the time of surgery), support your incision when coughing by placing a pillow or rolled up towels firmly against it. Once you are able to get out of bed, walk around indoors and cough well. You may stop using the incentive spirometer when instructed by your caregiver.  RISKS AND COMPLICATIONS Take your time so you do not get dizzy or light-headed. If you are in pain, you may need to take or  ask for pain medication before doing incentive spirometry. It is harder to take a deep breath if you are having pain. AFTER USE Rest and breathe slowly and easily. It can be helpful to keep track of a log of your progress. Your caregiver can provide you with a simple table to help with this. If you are using the spirometer at home, follow these instructions: [place] IF:  You are having difficultly using the spirometer. You have trouble using the spirometer as often as instructed. Your pain medication is not giving enough relief while using the spirometer. You develop fever of 100.5 F (38.1 C) or higher. SEEK IMMEDIATE MEDICAL CARE IF:  You cough up bloody sputum that had not been present before. You develop fever of 102 F (38.9 C) or greater. You develop worsening pain at or near the incision site. MAKE SURE YOU:  Understand these instructions. Will watch your condition. Will get help right away if you are not doing well or get worse. Document Released: 05/02/2006 Document Revised: 03/14/2011 Document Reviewed: 07/03/2006 [facility] Patient Information 2014 [place], [place].   ________________________________________________________________________

## 2021-12-22 ENCOUNTER — Other Ambulatory Visit: Payer: Self-pay

## 2021-12-22 ENCOUNTER — Encounter (HOSPITAL_COMMUNITY)
Admission: RE | Admit: 2021-12-22 | Discharge: 2021-12-22 | Disposition: A | Discharge status: Home / Self Care | Payer: PRIVATE HEALTH INSURANCE | Source: Ambulatory Visit | Attending: Orthopaedic Surgery | Admitting: Orthopaedic Surgery

## 2021-12-22 ENCOUNTER — Other Ambulatory Visit: Payer: Self-pay | Admitting: Physician Assistant

## 2021-12-22 ENCOUNTER — Encounter (HOSPITAL_COMMUNITY): Payer: Self-pay

## 2021-12-22 VITALS — BP 129/65 | HR 79 | Temp 98.1°F | Ht 63.0 in | Wt 156.0 lb

## 2021-12-22 DIAGNOSIS — E039 Hypothyroidism, unspecified: Secondary | ICD-10-CM | POA: Insufficient documentation

## 2021-12-22 DIAGNOSIS — Z01818 Encounter for other preprocedural examination: Secondary | ICD-10-CM

## 2021-12-22 DIAGNOSIS — I2510 Atherosclerotic heart disease of native coronary artery without angina pectoris: Secondary | ICD-10-CM | POA: Diagnosis not present

## 2021-12-22 DIAGNOSIS — M1712 Unilateral primary osteoarthritis, left knee: Secondary | ICD-10-CM | POA: Diagnosis not present

## 2021-12-22 DIAGNOSIS — I10 Essential (primary) hypertension: Secondary | ICD-10-CM | POA: Insufficient documentation

## 2021-12-22 LAB — COMPREHENSIVE METABOLIC PANEL
ALT: 22 U/L (ref 0–44)
AST: 19 U/L (ref 15–41)
Albumin: 4.4 g/dL (ref 3.5–5.0)
Alkaline Phosphatase: 66 U/L (ref 38–126)
Anion gap: 7 (ref 5–15)
BUN: 20 mg/dL (ref 8–23)
CO2: 24 mmol/L (ref 22–32)
Calcium: 9.7 mg/dL (ref 8.9–10.3)
Chloride: 110 mmol/L (ref 98–111)
Creatinine, Ser: 0.69 mg/dL (ref 0.44–1.00)
GFR, Estimated: >60 mL/min (ref >60)
Glucose, Bld: 125 mg/dL — ABNORMAL HIGH (ref 70–99)
Potassium: 3.9 mmol/L (ref 3.5–5.1)
Sodium: 141 mmol/L (ref 135–145)
Total Bilirubin: 0.8 mg/dL (ref 0.3–1.2)
Total Protein: 6.9 g/dL (ref 6.5–8.1)

## 2021-12-22 LAB — CBC
HCT: 36.1 % (ref 36.0–46.0)
Hemoglobin: 10.9 g/dL — ABNORMAL LOW (ref 12.0–15.0)
MCH: 20.5 pg — ABNORMAL LOW (ref 26.0–34.0)
MCHC: 30.2 g/dL (ref 30.0–36.0)
MCV: 67.7 fL — ABNORMAL LOW (ref 80.0–100.0)
Platelets: 195 10*3/uL (ref 150–400)
RBC: 5.33 MIL/uL — ABNORMAL HIGH (ref 3.87–5.11)
RDW: 14.8 % (ref 11.5–15.5)
WBC: 6 10*3/uL (ref 4.0–10.5)
nRBC: 0 % (ref 0.0–0.2)

## 2021-12-22 LAB — SURGICAL PCR SCREEN
MRSA, PCR: NEGATIVE
Staphylococcus aureus: NEGATIVE

## 2021-12-22 NOTE — Progress Notes (Signed)
For Short Stay: [place] appointment date:  Bowel Prep reminder:   For Anesthesia: PCP - Dr. Linus Salmons Cardiologist - Dr. Lona Kettle.: [place] [facility] vascular at [facility].LOV: 02/2021(as per pt.)  Chest x-ray -  EKG -  Stress Test -  ECHO -  Cardiac Cath - 2013/2017 Pacemaker/ICD device last checked: Pacemaker orders received: Device Rep notified:  Spinal Cord Stimulator:  Sleep Study -  CPAP -   Fasting Blood Sugar -  Checks Blood Sugar _____ times a day Date and result of last Hgb A1c-  Last dose of GLP1 agonist-  GLP1 instructions:   Last dose of SGLT-2 inhibitors-  SGLT-2 instructions:   Blood Thinner Instructions: Aspirin Instructions: Last Dose:  Activity level: Can go up a flight of stairs and activities of daily living without stopping and without chest pain and/or shortness of breath   Able to exercise without chest pain and/or shortness of breath   Unable to go up a flight of stairs without chest pain and/or shortness of breath     Anesthesia review: Hx: CAD (stent x 1 on right artery),HTN  Patient denies shortness of breath, fever, cough and chest pain at PAT appointment   Patient verbalized understanding of instructions that were given to them at the PAT appointment. Patient was also instructed that they will need to review over the PAT instructions again at home before surgery.

## 2021-12-23 NOTE — Progress Notes (Addendum)
Anesthesia Chart Review   Case: [id number] Date/Time: 12/31/21 1000   Procedure: LEFT TOTAL KNEE ARTHROPLASTY (Left: Knee)   Anesthesia type: Choice   Pre-op diagnosis: OSTEOARTHRITIS / DEGENRATIVE JOINT DISEASE LEFT KNEE   Location: [facility] ROOM 09 / [facility]   Surgeons: Mcarthur Rossetti, MD       DISCUSSION:61 y.o. never smoker with h/o HTN, CAD (RCA stent), hypothyroidism left knee OA scheduled for above procedure with Dr. Ninfa Linden.   Per previous anesthesia note, "In regards to DIFFICULT INTUBATION, there is a "Unrecognized Difficult Intubation" form dated 07/23/12 from [facility] scanned under the Media Tab, Correspondence, Encounter date 12/25/12. Reasons for difficulty included for cervical extension, short thyromental space, limited mouth opening, prominent teeth (overbite), anterior larynx. Ventilation was easy. Success was achieved with the patient unconscious/paralyzed using Glidescope. An alternative airway device after induction of anesthesia recommended for future anesthetics. 04/30/12 anesthesia record also obtained from [facility] (Media Tab, Correspondence, 03/29/16) that indicated on that day, patient had grade III view with MIL 2 blade, grade I view with AirTraq (guided video intubation). 7.0 ETT placed after three attempts. She had an LMA bilateral finger fusions 12/25/12 and a spinal for 03/29/16 right TKA done at [facility]. Previous note indicated that she reported upper central/lateral incisors with caps and right upper molar with a crown. "  H/o DES 2013, followed by [facility] Cardiology, Dr. Rosalita Chessman.  Clearance requested.   Addendum 12/30/2021:  Note received from cardiologist dated 12/29/2021 which states pt is cleared for upcoming surgery. Last seen by cardiology 02/08/2021.   Echo 05/30/2014 EF 60, mild TR.   Anticipate pt can proceed with planned procedure barring acute status change.   VS: BP 129/65   Pulse 79   Temp  36.7 C (Oral)   Ht '5\' 3"'$  (1.6 m)   Wt 70.8 kg   SpO2 99%   BMI 27.63 kg/m   PROVIDERS: Keane Police, MD is PCP   Cleora Fleet, MD is Cardiologist  LABS: Labs reviewed: Acceptable for surgery. (all labs ordered are listed, but only abnormal results are displayed)  Labs Reviewed  CBC - Abnormal; Notable for the following components:      Result Value   RBC 5.33 (*)    Hemoglobin 10.9 (*)    MCV 67.7 (*)    MCH 20.5 (*)    All other components within normal limits  COMPREHENSIVE METABOLIC PANEL - Abnormal; Notable for the following components:   Glucose, Bld 125 (*)    All other components within normal limits  SURGICAL PCR SCREEN  TYPE AND SCREEN     IMAGES:   EKG:   CV:  Past Medical History:  Diagnosis Date   Abnormally small mouth    Complication of anesthesia    Coronary artery disease    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 x 4 yr.   Hyperthyroidism    Hypothyroidism    Osteoarthritis    S/P coronary artery stent placement 04/2011   states right coronary artery   Thalassemia minor     Past Surgical History:  Procedure Laterality Date  ABDOMINAL HERNIA REPAIR  04/2012   ABDOMINAL SURGERY  01/2011   for intestinal blockage   APPENDECTOMY  1984   CARDIAC CATHETERIZATION     CATARACT EXTRACTION W/ INTRAOCULAR LENS IMPLANT Bilateral 05/2020   CHOLECYSTECTOMY  07/2012   COLON SURGERY     COLONOSCOPY     CORONARY ANGIOPLASTY  04/2011   stent x 1   DILATION AND CURETTAGE OF UTERUS     1989   DISTAL INTERPHALANGEAL JOINT FUSION Bilateral 12/25/2012   Procedure: DISTAL INTERPHALANGEAL JOINT FUSION RIGHT AND LEFT SMALL FINGER;  Surgeon: Cammie Sickle., MD;  Location:  [facility];  Service: Orthopedics;  Laterality: Bilateral;  left and right small finger   ESOPHAGOGASTRODUODENOSCOPY     EXCISION MASS LOWER EXTREMETIES Left 11/15/2018   Procedure: LEFT FOOT DEEP MASS EXCISION;  Surgeon: Erle Crocker, MD;  Location: [facility];  Service: Orthopedics;  Laterality: Left;  SURGERY REQUEST TIME 1.5 HOURS   FINGER SURGERY     HAND SURGERY  2014   Bilateral pinky finger distal joint fusion   HAND SURGERY  2016   Right Thumb reconstruction and left middle finger distal joint fusion   HAND SURGERY     Scar tissue removal right palm   HERNIA REPAIR     JOINT REPLACEMENT     right kne total knee replacement   KNEE ARTHROPLASTY     OVARIAN CYST REMOVAL     TOTAL KNEE ARTHROPLASTY Right 03/29/2016   TOTAL KNEE ARTHROPLASTY Right 03/29/2016   Procedure: TOTAL KNEE ARTHROPLASTY;  Surgeon: Garald Balding, MD;  Location: [facility];  Service: Orthopedics;  Laterality: Right;   TUBAL LIGATION Bilateral 1993   WISDOM TOOTH EXTRACTION      MEDICATIONS:  aspirin EC 81 MG tablet   carvedilol (COREG) 25 MG tablet   celecoxib (CELEBREX) 200 MG capsule   DULoxetine (CYMBALTA) 30 MG capsule   Fe Fum-FA-B Cmp-C-Zn-Mg-Mn-Cu (HEMOCYTE PLUS) 106-1 MG CAPS   levothyroxine (SYNTHROID) 100 MCG tablet   methocarbamol (ROBAXIN) 500 MG tablet   rosuvastatin (CRESTOR) 10 MG tablet   SUMAtriptan (IMITREX) 50 MG tablet   traMADol (ULTRAM) 50 MG tablet   No current facility-administered medications for this encounter.    Konrad Felix Ward, PA-C [facility] Pre-Surgical Testing [phone]

## 2021-12-31 ENCOUNTER — Other Ambulatory Visit: Payer: Self-pay

## 2021-12-31 ENCOUNTER — Encounter (HOSPITAL_COMMUNITY)
Admission: RE | Disposition: A | Discharge status: Home Health | Payer: Self-pay | Source: Ambulatory Visit | Attending: Orthopaedic Surgery

## 2021-12-31 ENCOUNTER — Observation Stay (HOSPITAL_COMMUNITY)
Admission: RE | Admit: 2021-12-31 | Discharge: 2022-01-01 | Disposition: A | Discharge status: Home Health | Payer: PRIVATE HEALTH INSURANCE | Source: Ambulatory Visit | Attending: Orthopaedic Surgery | Admitting: Orthopaedic Surgery

## 2021-12-31 ENCOUNTER — Ambulatory Visit (HOSPITAL_COMMUNITY): Payer: PRIVATE HEALTH INSURANCE | Admitting: Physician Assistant

## 2021-12-31 ENCOUNTER — Observation Stay (HOSPITAL_COMMUNITY): Payer: PRIVATE HEALTH INSURANCE

## 2021-12-31 ENCOUNTER — Ambulatory Visit (HOSPITAL_BASED_OUTPATIENT_CLINIC_OR_DEPARTMENT_OTHER): Payer: PRIVATE HEALTH INSURANCE | Admitting: Anesthesiology

## 2021-12-31 ENCOUNTER — Encounter (HOSPITAL_COMMUNITY): Payer: Self-pay | Admitting: Orthopaedic Surgery

## 2021-12-31 DIAGNOSIS — M1712 Unilateral primary osteoarthritis, left knee: Secondary | ICD-10-CM | POA: Diagnosis not present

## 2021-12-31 DIAGNOSIS — D638 Anemia in other chronic diseases classified elsewhere: Secondary | ICD-10-CM

## 2021-12-31 DIAGNOSIS — Z79899 Other long term (current) drug therapy: Secondary | ICD-10-CM | POA: Diagnosis not present

## 2021-12-31 DIAGNOSIS — E039 Hypothyroidism, unspecified: Secondary | ICD-10-CM

## 2021-12-31 DIAGNOSIS — I2510 Atherosclerotic heart disease of native coronary artery without angina pectoris: Secondary | ICD-10-CM | POA: Diagnosis not present

## 2021-12-31 DIAGNOSIS — Z955 Presence of coronary angioplasty implant and graft: Secondary | ICD-10-CM | POA: Diagnosis not present

## 2021-12-31 DIAGNOSIS — Z96651 Presence of right artificial knee joint: Secondary | ICD-10-CM | POA: Diagnosis not present

## 2021-12-31 DIAGNOSIS — I10 Essential (primary) hypertension: Secondary | ICD-10-CM | POA: Diagnosis not present

## 2021-12-31 DIAGNOSIS — Z96652 Presence of left artificial knee joint: Secondary | ICD-10-CM

## 2021-12-31 HISTORY — PX: TOTAL KNEE ARTHROPLASTY: SHX125

## 2021-12-31 LAB — TYPE AND SCREEN
ABO/RH(D): O POS
Antibody Screen: NEGATIVE

## 2021-12-31 SURGERY — ARTHROPLASTY, KNEE, TOTAL
Anesthesia: Spinal | Site: Knee | Laterality: Left

## 2021-12-31 MED ORDER — 0.9 % SODIUM CHLORIDE (POUR BTL) OPTIME
TOPICAL | Status: DC | PRN
  Administered 2021-12-31: 1000 mL

## 2021-12-31 MED ORDER — PANTOPRAZOLE SODIUM 40 MG PO TBEC
40.0000 mg | DELAYED_RELEASE_TABLET | Freq: Every day | ORAL | Status: DC
  Filled 2021-12-31: qty 1

## 2021-12-31 MED ORDER — ONDANSETRON HCL 4 MG PO TABS: 4.0000 mg | ORAL_TABLET | Freq: Four times a day (QID) | ORAL | Status: DC | PRN

## 2021-12-31 MED ORDER — ACETAMINOPHEN 500 MG PO TABS
1000.0000 mg | ORAL_TABLET | Freq: Once | ORAL | Status: AC
  Administered 2021-12-31: 1000 mg via ORAL
  Filled 2021-12-31: qty 2

## 2021-12-31 MED ORDER — AMISULPRIDE (ANTIEMETIC) 5 MG/2ML IV SOLN: 10.0000 mg | Freq: Once | INTRAVENOUS | Status: DC | PRN

## 2021-12-31 MED ORDER — FENTANYL CITRATE PF 50 MCG/ML IJ SOSY
12.5000 ug | PREFILLED_SYRINGE | INTRAMUSCULAR | Status: DC | PRN
  Administered 2021-12-31 – 2022-01-01 (×3): 12.5 ug via INTRAVENOUS
  Filled 2021-12-31 (×3): qty 1

## 2021-12-31 MED ORDER — METOCLOPRAMIDE HCL 5 MG PO TABS: 5.0000 mg | ORAL_TABLET | Freq: Three times a day (TID) | ORAL | Status: DC | PRN

## 2021-12-31 MED ORDER — CEFAZOLIN SODIUM-DEXTROSE 1-4 GM/50ML-% IV SOLN
1.0000 g | Freq: Four times a day (QID) | INTRAVENOUS | Status: AC
  Administered 2021-12-31 (×2): 1 g via INTRAVENOUS
  Filled 2021-12-31 (×2): qty 50

## 2021-12-31 MED ORDER — ALUM & MAG HYDROXIDE-SIMETH 200-200-20 MG/5ML PO SUSP: 30.0000 mL | ORAL | Status: DC | PRN

## 2021-12-31 MED ORDER — FENTANYL CITRATE PF 50 MCG/ML IJ SOSY
25.0000 ug | PREFILLED_SYRINGE | INTRAMUSCULAR | Status: DC | PRN
  Administered 2021-12-31: 50 ug via INTRAVENOUS

## 2021-12-31 MED ORDER — MEPERIDINE HCL 25 MG/ML IJ SOLN
25.0000 mg | INTRAMUSCULAR | Status: DC | PRN
  Administered 2021-12-31 – 2022-01-01 (×4): 25 mg via INTRAVENOUS
  Filled 2021-12-31 (×4): qty 1

## 2021-12-31 MED ORDER — CEFAZOLIN SODIUM-DEXTROSE 2-4 GM/100ML-% IV SOLN
2.0000 g | INTRAVENOUS | Status: AC
  Administered 2021-12-31: 2 g via INTRAVENOUS
  Filled 2021-12-31: qty 100

## 2021-12-31 MED ORDER — DEXAMETHASONE SODIUM PHOSPHATE 10 MG/ML IJ SOLN
INTRAMUSCULAR | Status: AC
  Filled 2021-12-31: qty 1

## 2021-12-31 MED ORDER — POLYETHYLENE GLYCOL 3350 17 G PO PACK: 17.0000 g | PACK | Freq: Every day | ORAL | Status: DC | PRN

## 2021-12-31 MED ORDER — POVIDONE-IODINE 10 % EX SWAB
2.0000 | Freq: Once | CUTANEOUS | Status: AC
  Administered 2021-12-31: 2 via TOPICAL

## 2021-12-31 MED ORDER — FENTANYL CITRATE PF 50 MCG/ML IJ SOSY
50.0000 ug | PREFILLED_SYRINGE | INTRAMUSCULAR | Status: DC
  Administered 2021-12-31: 50 ug via INTRAVENOUS
  Filled 2021-12-31: qty 2

## 2021-12-31 MED ORDER — DEXAMETHASONE SODIUM PHOSPHATE 10 MG/ML IJ SOLN
INTRAMUSCULAR | Status: DC | PRN
  Administered 2021-12-31: 10 mg via INTRAVENOUS

## 2021-12-31 MED ORDER — LEVOTHYROXINE SODIUM 100 MCG PO TABS
100.0000 ug | ORAL_TABLET | Freq: Every morning | ORAL | Status: DC
  Administered 2022-01-01: 100 ug via ORAL
  Filled 2021-12-31: qty 1

## 2021-12-31 MED ORDER — TRANEXAMIC ACID-NACL 1000-0.7 MG/100ML-% IV SOLN
1000.0000 mg | INTRAVENOUS | Status: AC
  Administered 2021-12-31: 1000 mg via INTRAVENOUS
  Filled 2021-12-31: qty 100

## 2021-12-31 MED ORDER — OXYCODONE HCL 5 MG PO TABS: 5.0000 mg | ORAL_TABLET | Freq: Once | ORAL | Status: DC | PRN

## 2021-12-31 MED ORDER — CARVEDILOL 25 MG PO TABS
25.0000 mg | ORAL_TABLET | Freq: Two times a day (BID) | ORAL | Status: DC
  Administered 2021-12-31 – 2022-01-01 (×2): 25 mg via ORAL
  Filled 2021-12-31 (×2): qty 1

## 2021-12-31 MED ORDER — FENTANYL CITRATE PF 50 MCG/ML IJ SOSY
PREFILLED_SYRINGE | INTRAMUSCULAR | Status: AC
  Filled 2021-12-31: qty 1

## 2021-12-31 MED ORDER — ONDANSETRON HCL 4 MG/2ML IJ SOLN: 4.0000 mg | Freq: Once | INTRAMUSCULAR | Status: DC | PRN

## 2021-12-31 MED ORDER — LACTATED RINGERS IV SOLN: INTRAVENOUS | Status: DC

## 2021-12-31 MED ORDER — MENTHOL 3 MG MT LOZG: 1.0000 | LOZENGE | OROMUCOSAL | Status: DC | PRN

## 2021-12-31 MED ORDER — ASPIRIN 81 MG PO CHEW
81.0000 mg | CHEWABLE_TABLET | Freq: Two times a day (BID) | ORAL | Status: DC
  Administered 2021-12-31 – 2022-01-01 (×2): 81 mg via ORAL
  Filled 2021-12-31 (×2): qty 1

## 2021-12-31 MED ORDER — METHOCARBAMOL 500 MG PO TABS
500.0000 mg | ORAL_TABLET | Freq: Four times a day (QID) | ORAL | Status: DC | PRN
  Administered 2021-12-31 – 2022-01-01 (×4): 500 mg via ORAL
  Filled 2021-12-31 (×5): qty 1

## 2021-12-31 MED ORDER — OXYCODONE HCL 5 MG PO TABS
10.0000 mg | ORAL_TABLET | ORAL | Status: DC | PRN
  Administered 2021-12-31 – 2022-01-01 (×6): 10 mg via ORAL
  Filled 2021-12-31 (×7): qty 2

## 2021-12-31 MED ORDER — PROPOFOL 500 MG/50ML IV EMUL
INTRAVENOUS | Status: DC | PRN
  Administered 2021-12-31: 100 ug/kg/min via INTRAVENOUS

## 2021-12-31 MED ORDER — SODIUM CHLORIDE 0.9 % IV SOLN: INTRAVENOUS | Status: DC

## 2021-12-31 MED ORDER — PROPOFOL 10 MG/ML IV BOLUS
INTRAVENOUS | Status: DC | PRN
  Administered 2021-12-31: 20 mg via INTRAVENOUS

## 2021-12-31 MED ORDER — ONDANSETRON HCL 4 MG/2ML IJ SOLN: 4.0000 mg | Freq: Four times a day (QID) | INTRAMUSCULAR | Status: DC | PRN

## 2021-12-31 MED ORDER — METHOCARBAMOL 500 MG IVPB - SIMPLE MED: 500.0000 mg | Freq: Four times a day (QID) | INTRAVENOUS | Status: DC | PRN

## 2021-12-31 MED ORDER — DIPHENHYDRAMINE HCL 12.5 MG/5ML PO ELIX: 12.5000 mg | ORAL_SOLUTION | ORAL | Status: DC | PRN

## 2021-12-31 MED ORDER — METOCLOPRAMIDE HCL 5 MG/ML IJ SOLN: 5.0000 mg | Freq: Three times a day (TID) | INTRAMUSCULAR | Status: DC | PRN

## 2021-12-31 MED ORDER — DULOXETINE HCL 30 MG PO CPEP
30.0000 mg | ORAL_CAPSULE | Freq: Every day | ORAL | Status: DC
  Administered 2021-12-31 – 2022-01-01 (×2): 30 mg via ORAL
  Filled 2021-12-31 (×2): qty 1

## 2021-12-31 MED ORDER — ONDANSETRON HCL 4 MG/2ML IJ SOLN
INTRAMUSCULAR | Status: DC | PRN
  Administered 2021-12-31: 4 mg via INTRAVENOUS

## 2021-12-31 MED ORDER — ONDANSETRON HCL 4 MG/2ML IJ SOLN
INTRAMUSCULAR | Status: AC
  Filled 2021-12-31: qty 2

## 2021-12-31 MED ORDER — CELECOXIB 200 MG PO CAPS
200.0000 mg | ORAL_CAPSULE | Freq: Every day | ORAL | Status: DC
  Administered 2021-12-31 – 2022-01-01 (×2): 200 mg via ORAL
  Filled 2021-12-31 (×2): qty 1

## 2021-12-31 MED ORDER — PHENOL 1.4 % MT LIQD: 1.0000 | OROMUCOSAL | Status: DC | PRN

## 2021-12-31 MED ORDER — SODIUM CHLORIDE 0.9 % IR SOLN
Status: DC | PRN
  Administered 2021-12-31: 1000 mL

## 2021-12-31 MED ORDER — ROPIVACAINE HCL 5 MG/ML IJ SOLN
INTRAMUSCULAR | Status: DC | PRN
  Administered 2021-12-31: 30 mL via PERINEURAL

## 2021-12-31 MED ORDER — PROPOFOL 1000 MG/100ML IV EMUL
INTRAVENOUS | Status: AC
  Filled 2021-12-31: qty 100

## 2021-12-31 MED ORDER — PHENYLEPHRINE 80 MCG/ML (10ML) SYRINGE FOR IV PUSH (FOR BLOOD PRESSURE SUPPORT)
PREFILLED_SYRINGE | INTRAVENOUS | Status: DC | PRN
  Administered 2021-12-31 (×4): 160 ug via INTRAVENOUS

## 2021-12-31 MED ORDER — DOCUSATE SODIUM 100 MG PO CAPS
100.0000 mg | ORAL_CAPSULE | Freq: Two times a day (BID) | ORAL | Status: DC
  Administered 2021-12-31 – 2022-01-01 (×2): 100 mg via ORAL
  Filled 2021-12-31 (×2): qty 1

## 2021-12-31 MED ORDER — CHLORHEXIDINE GLUCONATE 0.12 % MT SOLN
15.0000 mL | Freq: Once | OROMUCOSAL | Status: AC
  Administered 2021-12-31: 15 mL via OROMUCOSAL

## 2021-12-31 MED ORDER — STERILE WATER FOR IRRIGATION IR SOLN
Status: DC | PRN
  Administered 2021-12-31 (×2): 1000 mL

## 2021-12-31 MED ORDER — ORAL CARE MOUTH RINSE: 15.0000 mL | Freq: Once | OROMUCOSAL | Status: AC

## 2021-12-31 MED ORDER — ROSUVASTATIN CALCIUM 10 MG PO TABS
10.0000 mg | ORAL_TABLET | Freq: Every day | ORAL | Status: DC
  Administered 2022-01-01: 10 mg via ORAL
  Filled 2021-12-31: qty 1

## 2021-12-31 MED ORDER — MIDAZOLAM HCL 2 MG/2ML IJ SOLN
1.0000 mg | INTRAMUSCULAR | Status: DC
  Administered 2021-12-31: 2 mg via INTRAVENOUS
  Filled 2021-12-31: qty 2

## 2021-12-31 MED ORDER — OXYCODONE HCL 5 MG/5ML PO SOLN: 5.0000 mg | Freq: Once | ORAL | Status: DC | PRN

## 2021-12-31 MED ORDER — BUPIVACAINE IN DEXTROSE 0.75-8.25 % IT SOLN
INTRATHECAL | Status: DC | PRN
  Administered 2021-12-31: 1.6 mL via INTRATHECAL

## 2021-12-31 SURGICAL SUPPLY — 64 items
APL SKNCLS STERI-STRIP NONHPOA (GAUZE/BANDAGES/DRESSINGS)
BAG COUNTER SPONGE SURGICOUNT (BAG) IMPLANT
BAG SPEC THK2 15X12 ZIP CLS (MISCELLANEOUS) ×1
BAG SPNG CNTER NS LX DISP (BAG)
BAG ZIPLOCK 12X15 (MISCELLANEOUS) ×1 IMPLANT
BENZOIN TINCTURE PRP APPL 2/3 (GAUZE/BANDAGES/DRESSINGS) IMPLANT
BLADE SAG 18X100X1.27 (BLADE) ×1 IMPLANT
BLADE SURG SZ10 CARB STEEL (BLADE) ×2 IMPLANT
BNDG ELASTIC 6X5.8 VLCR STR LF (GAUZE/BANDAGES/DRESSINGS) ×2 IMPLANT
BOWL SMART MIX CTS (DISPOSABLE) IMPLANT
BSPLAT TIB 5D D CMNT STM LT (Knees) ×1 IMPLANT
CEMENT BONE R 1X40 (Cement) IMPLANT
CEMENT BONE SIMPLEX SPEEDSET (Cement) IMPLANT
COMP FEM CEMT PERS SZ 7 LT (Joint) ×1 IMPLANT
COMPONENT FEM CMT PERS SZ7 LT (Joint) IMPLANT
COOLER ICEMAN CLASSIC (MISCELLANEOUS) ×1 IMPLANT
COVER SURGICAL LIGHT HANDLE (MISCELLANEOUS) ×1 IMPLANT
CUFF TOURN SGL QUICK 34 (TOURNIQUET CUFF) ×1
CUFF TRNQT CYL 34X4.125X (TOURNIQUET CUFF) ×1 IMPLANT
DRAPE INCISE IOBAN 66X45 STRL (DRAPES) ×1 IMPLANT
DRAPE U-SHAPE 47X51 STRL (DRAPES) ×1 IMPLANT
DURAPREP 26ML APPLICATOR (WOUND CARE) ×1 IMPLANT
ELECT BLADE TIP CTD 4 INCH (ELECTRODE) ×1 IMPLANT
ELECT REM PT RETURN 15FT ADLT (MISCELLANEOUS) ×1 IMPLANT
GAUZE PAD ABD 8X10 STRL (GAUZE/BANDAGES/DRESSINGS) ×2 IMPLANT
GAUZE SPONGE 4X4 12PLY STRL (GAUZE/BANDAGES/DRESSINGS) ×1 IMPLANT
GAUZE XEROFORM 1X8 LF (GAUZE/BANDAGES/DRESSINGS) IMPLANT
GLOVE BIO SURGEON STRL SZ7.5 (GLOVE) ×1 IMPLANT
GLOVE BIOGEL PI IND STRL 8 (GLOVE) ×2 IMPLANT
GLOVE ECLIPSE 8.0 STRL XLNG CF (GLOVE) ×1 IMPLANT
GOWN STRL REUS W/ TWL XL LVL3 (GOWN DISPOSABLE) ×2 IMPLANT
GOWN STRL REUS W/TWL XL LVL3 (GOWN DISPOSABLE) ×2
GUIDE TASP BOTTOM PS CD LT +0 (INSTRUMENTS) IMPLANT
HANDPIECE INTERPULSE COAX TIP (DISPOSABLE) ×1
HDLS TROCR DRIL PIN KNEE 75 (PIN) ×1
HOLDER FOLEY CATH W/STRAP (MISCELLANEOUS) IMPLANT
IMMOBILIZER KNEE 20 (SOFTGOODS) ×1
IMMOBILIZER KNEE 20 THIGH 36 (SOFTGOODS) ×1 IMPLANT
INSERT TIB ASF 14 6-7/CD LT (Insert) IMPLANT
KIT TURNOVER KIT A (KITS) IMPLANT
NS IRRIG 1000ML POUR BTL (IV SOLUTION) ×1 IMPLANT
PACK TOTAL KNEE CUSTOM (KITS) ×1 IMPLANT
PAD COLD SHLDR WRAP-ON (PAD) ×1 IMPLANT
PADDING CAST COTTON 6X4 STRL (CAST SUPPLIES) ×2 IMPLANT
PIN DRILL HDLS TROCAR 75 4PK (PIN) IMPLANT
PROTECTOR NERVE ULNAR (MISCELLANEOUS) ×1 IMPLANT
SCREW FEMALE HEX FIX 25X2.5 (ORTHOPEDIC DISPOSABLE SUPPLIES) IMPLANT
SCREW HEADED 33MM KNEE (MISCELLANEOUS) IMPLANT
SET HNDPC FAN SPRY TIP SCT (DISPOSABLE) ×1 IMPLANT
SET PAD KNEE POSITIONER (MISCELLANEOUS) ×1 IMPLANT
SPIKE FLUID TRANSFER (MISCELLANEOUS) IMPLANT
STAPLER VISISTAT 35W (STAPLE) IMPLANT
STEM POLY PAT PLY 29M KNEE (Knees) IMPLANT
STEM TIBIA 5 DEG SZ D L KNEE (Knees) IMPLANT
STRIP CLOSURE SKIN 1/2X4 (GAUZE/BANDAGES/DRESSINGS) IMPLANT
SUT MNCRL AB 4-0 PS2 18 (SUTURE) IMPLANT
SUT VIC AB 0 CT1 27 (SUTURE) ×1
SUT VIC AB 0 CT1 27XBRD ANTBC (SUTURE) ×1 IMPLANT
SUT VIC AB 1 CT1 36 (SUTURE) ×2 IMPLANT
SUT VIC AB 2-0 CT1 27 (SUTURE) ×2
SUT VIC AB 2-0 CT1 TAPERPNT 27 (SUTURE) ×2 IMPLANT
TIBIA STEM 5 DEG SZ D L KNEE (Knees) ×1 IMPLANT
TRAY FOLEY MTR SLVR 16FR STAT (SET/KITS/TRAYS/PACK) IMPLANT
WATER STERILE IRR 1000ML POUR (IV SOLUTION) ×2 IMPLANT

## 2021-12-31 NOTE — Op Note (Signed)
Operative Note  Date of operation: 12/31/2021 Preoperative diagnosis: Left knee primary osteoarthritis Postoperative diagnosis: Same  Procedure: Left cemented total knee arthroplasty  Implants: Biomet/Zimmer persona cemented knee system with size 7 left narrow CR femur, size D left tibial tray, 14 mm thickness left medial congruent fixed-bearing polythene insert, 29 mm patella button  Surgeon: Lind Guest. Ninfa Linden, MD Assistant: Benita Stabile, PA-C  Anesthesia: #1 left lower extremity adductor canal block, #2 spinal Tourniquet time: Under 1 hour Antibiotics: 2 g IV Ancef EBL: Less than 784 cc Complications: None  Indications: The patient is a 61 year old female with debilitating arthritis involving her left knee.  She actually underwent a successful right total knee arthroplasty in 2018.  Her x-ray showed normal alignment except she has bone-on-bone wear of the patellofemoral joint and the medial compartment the knee.  She has tried and failed all forms of conservative treatment at this point her left knee pain is daily and it is detrimentally affecting her mobility, her quality of life and actives daily living to the point she wishes to proceed with a knee replacement on the left side.  Having had this before on the right side she is fully aware the risk of acute blood loss anemia, nerve or vessel injury, fracture, infection, DVT, implant failure and wound healing issues.  She understands her goals are hopefully decrease pain, improve mobility, and improve quality of life.  Procedure description: After informed consent was obtained and the appropriate left knee was marked, anesthesia obtained a left lower extremity adductor canal block in the holding room and the patient was brought to the operating room and set up on the operating table where spinal anesthesia was obtained.  She was then laid in supine position on the operating table and a Foley catheter was placed.  A nonsterile tourniquet  is placed around her upper left thigh and her left thigh, knee, leg, ankle and foot were prepped and draped with DuraPrep and sterile drapes.  A timeout was called and she identified as correct patient the correct left knee.  An Esmarch was then used to wrap out the leg and the tourniquet was inflated to 300 mm of pressure.  A direct midline incision was carried over the patella and taken proximally distally with the knee extended.  Dissection was carried down to the knee joint and a medial parapatellar arthrotomy was carried out.  A moderate joint effusion and synovitis was found in the knee.  With the knee in a flexed position there was areas of full-thickness cartilage loss throughout the medial compartment the knee and the patellofemoral joint.  Osteophytes removed from all 3 compartments including remnants of the ACL, medial and lateral meniscus.  We then used the extramedullary cutting guide for making her proximal tibia cut correction varus and valgus and a 3 degree slope with taking 2 mm off the low side.  We made this cut without difficulty.  We then went to the femur for an intramedullary cutting guide for a left knee at 5 degrees externally rotated for 10 mm distal femoral cut.  We made this cut without difficulty and brought the knee back down full extension and achieve full extension.  We then went back to the femur and put a femoral sizing guide based off the epicondylar axis.  Based off of this we chose a size 7 femur.  We put a 4-in-1 cutting block for size 7 femur and made her anterior posterior cuts followed by the chamfer cuts.  We then  went back to the tibia and chose a size D left tibial tray for coverage over the tibial plateau setting the rotation of the tibial tubercle on the femur.  We did our keel punch and drill hole off of this.  We then trialed our size the left tibia combined with our size 7 right CR standard femur.  We placed a 10 mm medial congruent pale polythene insert and went all  the way up to the 14 mm insert and I was pleased with the stability with a 14 mm insert.  We then made a patella cut and drilled 3 holes for a size 29 patella button.  With all trials rotation of the knee I put her through several cycles of motion we are pleased with range of motion and stability.  Of note we decided we did need a narrow femur due to her small stature.  All trial components removed and then we irrigated the knee with normal saline solution.  The knee was then dried and in a flexed position we mixed our cement and cemented our Biomet Zimmer persona tibial tray for a left knee size D followed by our size 7 left CR narrow femur.  We placed our 14 mm medial congruent fixed-bearing polythene insert for her left knee and cemented our size 29 patella button.  The knee was then held fully extended and compressed while the cement hardened.  Once it hardened the tourniquet was let down and hemostasis was obtained with electrocautery.  The arthrotomy was then closed with interrupted #1 Vicryl suture followed by 0 Vicryl close the deep tissue and 2-0 Vicryl close subcutaneous tissue.  The skin was closed with staples.  Applied a sterile dressing was applied the patient was taken recovery in stable addition.  Benita Stabile, PA-C did assisted in the entire case and his assistance was crucial and medically necessary for soft tissue retraction and management as well as helping guide implant placement and a layered closure of the wound.

## 2021-12-31 NOTE — H&P (Signed)
TOTAL KNEE ADMISSION H&P  Patient is being admitted for left total knee arthroplasty.  Subjective:  Chief Complaint:left knee pain.  HPI: Shannon Jennings, 61 y.o. female, has a history of pain and functional disability in the left knee due to arthritis and has failed non-surgical conservative treatments for greater than 12 weeks to includeNSAID's and/or analgesics, corticosteriod injections, viscosupplementation injections, flexibility and strengthening excercises, and activity modification.  Onset of symptoms was gradual, starting 2 years ago with gradually worsening course since that time. The patient noted no past surgery on the left knee(s).  Patient currently rates pain in the left knee(s) at 10 out of 10 with activity. Patient has night pain, worsening of pain with activity and weight bearing, pain that interferes with activities of daily living, pain with passive range of motion, crepitus, and joint swelling.  Patient has evidence of subchondral sclerosis, periarticular osteophytes, and joint space narrowing by imaging studies. There is no active infection.  Patient Active Problem List   Diagnosis Date Noted   Unilateral primary osteoarthritis, left knee 05/30/2016   S/P total knee replacement using cement, right 04/12/2016   Fibromyalgia 01/22/2016   Primary osteoarthritis of both hands 01/22/2016   Essential hypertension 01/22/2016   History of coronary artery disease 01/22/2016   History of Graves' disease 01/22/2016   Dyslipidemia 01/22/2016   History of thalassemia minor 01/22/2016   Past Medical History:  Diagnosis Date   Abnormally small mouth    Complication of anesthesia    Coronary artery disease    Degenerative joint disease of hand 12/2012   right and left small fingers   Difficult intubation 07/2012   Fibromyalgia    Headache    migraines   High cholesterol    History of Graves' disease    History of kidney stones    History of migraine    Hypertension    under  control with med., has been on med. x 4 yr.   Hyperthyroidism    Hypothyroidism    Osteoarthritis    S/P coronary artery stent placement 04/2011   states right coronary artery   Thalassemia minor     Past Surgical History:  Procedure Laterality Date   ABDOMINAL HERNIA REPAIR  04/2012   ABDOMINAL SURGERY  01/2011   for intestinal blockage   APPENDECTOMY  1984   CARDIAC CATHETERIZATION     CATARACT EXTRACTION W/ INTRAOCULAR LENS IMPLANT Bilateral 05/2020   CHOLECYSTECTOMY  07/2012   COLON SURGERY     COLONOSCOPY     CORONARY ANGIOPLASTY  04/2011   stent x 1   DILATION AND CURETTAGE OF UTERUS     1989   DISTAL INTERPHALANGEAL JOINT FUSION Bilateral 12/25/2012   Procedure: DISTAL INTERPHALANGEAL JOINT FUSION RIGHT AND LEFT SMALL FINGER;  Surgeon: Cammie Sickle., MD;  Location: [place];  Service: Orthopedics;  Laterality: Bilateral;  left and right small finger   ESOPHAGOGASTRODUODENOSCOPY     EXCISION MASS LOWER EXTREMETIES Left 11/15/2018   Procedure: LEFT FOOT DEEP MASS EXCISION;  Surgeon: Erle Crocker, MD;  Location: [place];  Service: Orthopedics;  Laterality: Left;  SURGERY REQUEST TIME 1.5 HOURS   FINGER SURGERY     HAND SURGERY  2014   Bilateral pinky finger distal joint fusion   HAND SURGERY  2016   Right Thumb reconstruction and left middle finger distal joint fusion   HAND SURGERY     Scar tissue removal right palm   HERNIA REPAIR  JOINT REPLACEMENT     right kne total knee replacement   KNEE ARTHROPLASTY     OVARIAN CYST REMOVAL     TOTAL KNEE ARTHROPLASTY Right 03/29/2016   TOTAL KNEE ARTHROPLASTY Right 03/29/2016   Procedure: TOTAL KNEE ARTHROPLASTY;  Surgeon: Garald Balding, MD;  Location: [place];  Service: Orthopedics;  Laterality: Right;   TUBAL LIGATION Bilateral 1993   WISDOM TOOTH EXTRACTION      Current Facility-Administered Medications  Medication Dose Route Frequency Provider Last Rate Last Admin   ceFAZolin (ANCEF)  IVPB 2g/100 mL premix  2 g Intravenous On Call to OR Pete Pelt, PA-C       fentaNYL (SUBLIMAZE) injection 50-100 mcg  50-100 mcg Intravenous UD Christella Hartigan, [name], MD       lactated ringers infusion   Intravenous Continuous Duane Boston, MD       midazolam (VERSED) injection 1-2 mg  1-2 mg Intravenous UD Lidia Collum, MD       tranexamic acid (CYKLOKAPRON) IVPB 1,000 mg  1,000 mg Intravenous To OR Pete Pelt, PA-C       Allergies  Allergen Reactions   Tizanidine     Chest pain    Toradol [Ketorolac Tromethamine]     Chest pain    Trokendi Xr [Topiramate Er]     Double vision    Dilaudid [Hydromorphone Hcl] Other (See Comments)    manic   Penicillins Rash    Has patient had a PCN reaction causing immediate rash, facial/tongue/throat swelling, SOB or lightheadedness with hypotension: No Has patient had a PCN reaction causing severe rash involving mucus membranes or skin necrosis: unknown Has patient had a PCN reaction that required hospitalization No Has patient had a PCN reaction occurring within the last 10 years: No If all of the above answers are "NO", then may proceed with Cephalosporin use.    Sulfa Antibiotics Rash    Social History   Tobacco Use   Smoking status: Never   Smokeless tobacco: Never  Substance Use Topics   Alcohol use: No    Family History  Problem Relation Age of Onset   Heart disease Mother    Arthritis Mother    Cancer Sister    Seizures Sister    Arthritis Brother    Diabetes Brother      Review of Systems  Musculoskeletal:  Positive for gait problem and joint swelling.  All other systems reviewed and are negative.   Objective:  Physical Exam Vitals reviewed.  Constitutional:      Appearance: Normal appearance. She is normal weight.  HENT:     Head: Normocephalic and atraumatic.  Eyes:     Extraocular Movements: Extraocular movements intact.     Pupils: Pupils are equal, round, and reactive to light.  Cardiovascular:      Rate and Rhythm: Normal rate and regular rhythm.     Pulses: Normal pulses.  Pulmonary:     Effort: Pulmonary effort is normal.     Breath sounds: Normal breath sounds.  Abdominal:     Palpations: Abdomen is soft.  Musculoskeletal:     Cervical back: Normal range of motion and neck supple.     Left knee: Effusion, bony tenderness and crepitus present. Decreased range of motion. Tenderness present over the medial joint line, lateral joint line and patellar tendon. Abnormal meniscus.  Neurological:     Mental Status: She is alert and oriented to person, place, and time.  Psychiatric:  Behavior: Behavior normal.     Vital signs in last 24 hours: Temp:  [98.1 F (36.7 C)] 98.1 F (36.7 C) (12/29 0804) Pulse Rate:  [72] 72 (12/29 0804) Resp:  [16] 16 (12/29 0804) BP: (131)/(81) 131/81 (12/29 0804) SpO2:  [97 %] 97 % (12/29 0804) Weight:  [70.8 kg] 70.8 kg (12/29 0802)  Labs:   Estimated body mass index is 27.63 kg/m as calculated from the following:   Height as of this encounter: '5\' 3"'$  (1.6 m).   Weight as of this encounter: 70.8 kg.   Imaging Review Plain radiographs demonstrate severe degenerative joint disease of the left knee(s). The overall alignment isneutral. The bone quality appears to be excellent for age and reported activity level.      Assessment/Plan:  End stage arthritis, left knee   The patient history, physical examination, clinical judgment of the provider and imaging studies are consistent with end stage degenerative joint disease of the left knee(s) and total knee arthroplasty is deemed medically necessary. The treatment options including medical management, injection therapy arthroscopy and arthroplasty were discussed at length. The risks and benefits of total knee arthroplasty were presented and reviewed. The risks due to aseptic loosening, infection, stiffness, patella tracking problems, thromboembolic complications and other imponderables  were discussed. The patient acknowledged the explanation, agreed to proceed with the plan and consent was signed. Patient is being admitted for inpatient treatment for surgery, pain control, PT, OT, prophylactic antibiotics, VTE prophylaxis, progressive ambulation and ADL's and discharge planning. The patient is planning to be discharged home with home health services

## 2021-12-31 NOTE — Anesthesia Postprocedure Evaluation (Signed)
Anesthesia Post Note  Patient: Shannon Jennings  Procedure(s) Performed: LEFT TOTAL KNEE ARTHROPLASTY (Left: Knee)     Patient location during evaluation: PACU Anesthesia Type: Spinal Level of consciousness: awake and alert Pain management: pain level controlled Vital Signs Assessment: post-procedure vital signs reviewed and stable Respiratory status: spontaneous breathing, nonlabored ventilation and respiratory function stable Cardiovascular status: blood pressure returned to baseline and stable Postop Assessment: no apparent nausea or vomiting, spinal receding, no headache and no backache Anesthetic complications: no   No notable events documented.  Last Vitals:  Vitals:   12/31/21 1306 12/31/21 1508  BP: (!) 142/80 131/75  Pulse: 76 77  Resp: 16 18  Temp: 36.7 C 36.7 C  SpO2: 97% 94%    Last Pain:  Vitals:   12/31/21 1748  TempSrc:   PainSc: 7                  Lidia Collum

## 2021-12-31 NOTE — Anesthesia Procedure Notes (Signed)
Spinal  Patient location during procedure: OR End time: 12/31/2021 10:08 AM Reason for block: surgical anesthesia Staffing Performed: resident/CRNA  Resident/CRNA: Noralyn Pick D, CRNA Performed by: Eulogia Dismore, Clinical cytogeneticist D, CRNA Authorized by: Lidia Collum, MD   Preanesthetic Checklist Completed: patient identified, IV checked, site marked, risks and benefits discussed, surgical consent, monitors and equipment checked, pre-op evaluation and timeout performed Spinal Block Patient position: sitting Prep: DuraPrep Patient monitoring: heart rate, continuous pulse ox and blood pressure Approach: midline Location: L3-4 Injection technique: single-shot Needle Needle type: Pencan  Needle gauge: 24 G Needle length: 9 cm Assessment Sensory level: T6 Events: CSF return

## 2021-12-31 NOTE — Evaluation (Signed)
Physical Therapy Evaluation Patient Details Name: Shannon Jennings MRN: [id number] DOB: May 18, 1960 Today's Date: 12/31/2021  History of Present Illness  Pt is a 61yo female presenting s/p L-TKA on 12/31/21. PMH: R-TKA 2018, fibromylagia, HTN, Graves' disease  Clinical Impression  Shannon Jennings is a 61 y.o. female POD 0 s/p L-TKA. Patient reports independence with mobility at baseline. Patient is now limited by functional impairments (see PT problem list below) and requires min assist for bed mobility and min guard for transfers. Patient was able to ambulate 30 feet with RW and min guard level of assist. Patient instructed in exercise to facilitate ROM and circulation to manage edema. Provided incentive spirometer and with Vcs pt able to achieve 1780m. Patient will benefit from continued skilled PT interventions to address impairments and progress towards PLOF. Acute PT will follow to progress mobility and HEP in preparation for safe discharge home.       Recommendations for follow up therapy are one component of a multi-disciplinary discharge planning process, led by the attending physician.  Recommendations may be updated based on patient status, additional functional criteria and insurance authorization.  Follow Up Recommendations Follow physician's recommendations for discharge plan and follow up therapies      Assistance Recommended at Discharge Frequent or constant Supervision/Assistance  Patient can return home with the following  A little help with walking and/or transfers;A little help with bathing/dressing/bathroom;Assistance with cooking/housework;Assist for transportation;Help with stairs or ramp for entrance    Equipment Recommendations None recommended by PT  Recommendations for Other Services       Functional Status Assessment Patient has had a recent decline in their functional status and demonstrates the ability to make significant improvements in function in a reasonable and  predictable amount of time.     Precautions / Restrictions Precautions Precautions: Knee;Fall Precaution Booklet Issued: No Precaution Comments: no pillow under the knee Required Braces or Orthoses: Knee Immobilizer - Left Knee Immobilizer - Left: On when out of bed or walking;Discontinue once straight leg raise with < 10 degree lag Restrictions Weight Bearing Restrictions: No Other Position/Activity Restrictions: wbat      Mobility  Bed Mobility Overal bed mobility: Needs Assistance Bed Mobility: Supine to Sit     Supine to sit: Min assist, HOB elevated     General bed mobility comments: Min assist to bring LLE off bed, otherwise min guard    Transfers Overall transfer level: Needs assistance Equipment used: Rolling walker (2 wheels) Transfers: Sit to/from Stand Sit to Stand: Min guard           General transfer comment: For safety only, VCs for hand placement and powering up off bed    Ambulation/Gait Ambulation/Gait assistance: Min guard Gait Distance (Feet): 30 Feet Assistive device: Rolling walker (2 wheels) Gait Pattern/deviations: Step-to pattern, Decreased stance time - left, Decreased step length - left Gait velocity: decreased     General Gait Details: Pt ambulated with RW and min guard, no physical assist required or overt LOB noted.  Stairs            Wheelchair Mobility    Modified Rankin (Stroke Patients Only)       Balance Overall balance assessment: Needs assistance Sitting-balance support: Feet supported, No upper extremity supported Sitting balance-Leahy Scale: Good     Standing balance support: Reliant on assistive device for balance, During functional activity, Bilateral upper extremity supported Standing balance-Leahy Scale: Poor  Pertinent Vitals/Pain Pain Assessment Pain Assessment: 0-10 Pain Score: 6  Pain Location: left knee Pain Descriptors / Indicators: Operative site  guarding Pain Intervention(s): Limited activity within patient's tolerance, Monitored during session, Repositioned, Ice applied    Home Living Family/patient expects to be discharged to:: Private residence Living Arrangements: Spouse/significant other Available Help at Discharge: Family;Available 24 hours/day Type of Home: House Home Access: Level entry       Home Layout: Two level;Able to live on main level with bedroom/bathroom Home Equipment: Rolling Walker (2 wheels)      Prior Function Prior Level of Function : Independent/Modified Independent;Driving;Working/employed [employer])             Mobility Comments: ind ADLs Comments: ind     Hand Dominance   Dominant Hand: Right    Extremity/Trunk Assessment   Upper Extremity Assessment Upper Extremity Assessment: Overall WFL for tasks assessed    Lower Extremity Assessment Lower Extremity Assessment: RLE deficits/detail;LLE deficits/detail RLE Deficits / Details: MMT ank DF/PF 4/5 RLE Sensation: WNL LLE Deficits / Details: MMT ank DF/PF 3+/5, Pt able to perform SLR with mild extensor lag noted and very short height ~1" off bed. LLE Sensation: WNL    Cervical / Trunk Assessment Cervical / Trunk Assessment: Kyphotic  Communication   Communication: No difficulties  Cognition Arousal/Alertness: Awake/alert Behavior During Therapy: WFL for tasks assessed/performed Overall Cognitive Status: Within Functional Limits for tasks assessed                                          General Comments      Exercises Total Joint Exercises Ankle Circles/Pumps: AROM, Both, 20 reps   Assessment/Plan    PT Assessment Patient needs continued PT services  PT Problem List Decreased strength;Decreased range of motion;Decreased activity tolerance;Decreased balance;Decreased mobility;Decreased coordination;Pain       PT Treatment Interventions DME instruction;Gait training;Stair training;Functional  mobility training;Therapeutic activities;Therapeutic exercise;Balance training;Neuromuscular re-education;Patient/family education    PT Goals (Current goals can be found in the Care Plan section)  Acute Rehab PT Goals Patient Stated Goal: to walk without pain PT Goal Formulation: With patient Time For Goal Achievement: 01/07/22 Potential to Achieve Goals: Good    Frequency 7X/week     Co-evaluation               AM-PAC PT "6 Clicks" Mobility  Outcome Measure Help needed turning from your back to your side while in a flat bed without using bedrails?: None Help needed moving from lying on your back to sitting on the side of a flat bed without using bedrails?: A Little Help needed moving to and from a bed to a chair (including a wheelchair)?: A Little Help needed standing up from a chair using your arms (e.g., wheelchair or bedside chair)?: A Little Help needed to walk in hospital room?: A Little Help needed climbing 3-5 steps with a railing? : A Little 6 Click Score: 19    End of Session Equipment Utilized During Treatment: Gait belt;Left knee immobilizer Activity Tolerance: Patient tolerated treatment well Patient left: in chair;with call bell/phone within reach;with chair alarm set;with SCD's reapplied Nurse Communication: Mobility status PT Visit Diagnosis: Pain;Difficulty in walking, not elsewhere classified (R26.2) Pain - Right/Left: Left Pain - part of body: Knee    Time: 2952-8413 PT Time Calculation (min) (ACUTE ONLY): 24 min   Charges:   PT Evaluation $PT Eval  Low Complexity: 1 Low PT Treatments $Gait Training: 8-22 mins        Coolidge Breeze, PT, DPT [facility] Department Office: [phone] Weekend pager: [phone]  Coolidge Breeze 12/31/2021, 4:49 PM

## 2021-12-31 NOTE — Anesthesia Procedure Notes (Signed)
Anesthesia Regional Block: Adductor canal block   Pre-Anesthetic Checklist: , timeout performed,  Correct Patient, Correct Site, Correct Laterality,  Correct Procedure, Correct Position, site marked,  Risks and benefits discussed,  Surgical consent,  Pre-op evaluation,  At surgeon's request and post-op pain management  Laterality: Left  Prep: chloraprep       Needles:  Injection technique: Single-shot  Needle Type: Echogenic Stimulator Needle     Needle Length: 10cm  Needle Gauge: 20     Additional Needles:   Procedures:,,,, ultrasound used (permanent image in chart),,    Narrative:  Start time: 12/31/2021 9:30 AM End time: 12/31/2021 9:34 AM Injection made incrementally with aspirations every 5 mL.  Performed by: Personally  Anesthesiologist: Lidia Collum, MD  Additional Notes: Standard monitors applied. Skin prepped. Good needle visualization with ultrasound. Injection made in 5cc increments with no resistance to injection. Patient tolerated the procedure well.

## 2021-12-31 NOTE — Anesthesia Preprocedure Evaluation (Addendum)
Anesthesia Evaluation  Patient identified by MRN, date of birth, ID band Patient awake    Reviewed: Allergy & Precautions, NPO status , Patient's Chart, lab work & pertinent test results, reviewed documented beta blocker date and time   History of Anesthesia Complications (+) DIFFICULT AIRWAY and history of anesthetic complications (previous difficult intubation at [facility] in 2014; grade III view with Sabra Heck 2, but had successful [place]q intubation; noted to be easy MV)  Airway Mallampati: IV  TM Distance: >3 FB Neck ROM: Full    Dental  (+) Teeth Intact, Caps,    Pulmonary neg pulmonary ROS   Pulmonary exam normal        Cardiovascular hypertension, Pt. on medications and Pt. on home beta blockers + CAD and + Cardiac Stents (2013)  Normal cardiovascular exam     Neuro/Psych  Headaches    GI/Hepatic negative GI ROS, Neg liver ROS,,,  Endo/Other  Hypothyroidism (h/o Graves disease, now on synthroid)    Renal/GU negative Renal ROS  negative genitourinary   Musculoskeletal  (+) Arthritis , Osteoarthritis,  Fibromyalgia -  Abdominal   Peds  Hematology  (+) Blood dyscrasia, anemia   Anesthesia Other Findings   Reproductive/Obstetrics                             Anesthesia Physical Anesthesia Plan  ASA: 3  Anesthesia Plan: Spinal   Post-op Pain Management: Regional block* and Tylenol PO (pre-op)*   Induction:   PONV Risk Score and Plan: 2 and Propofol infusion, Treatment may vary due to age or medical condition, Ondansetron, TIVA and Midazolam  Airway Management Planned: Nasal Cannula and Simple Face Mask  Additional Equipment: None  Intra-op Plan:   Post-operative Plan:   Informed Consent: I have reviewed the patients History and Physical, chart, labs and discussed the procedure including the risks, benefits and alternatives for the proposed anesthesia with  the patient or authorized representative who has indicated his/her understanding and acceptance.       Plan Discussed with:   Anesthesia Plan Comments:         Anesthesia Quick Evaluation

## 2021-12-31 NOTE — Transfer of Care (Signed)
Immediate Anesthesia Transfer of Care Note  Patient: Shannon Jennings  Procedure(s) Performed: LEFT TOTAL KNEE ARTHROPLASTY (Left: Knee)  Patient Location: PACU  Anesthesia Type:Regional and Spinal  Level of Consciousness: awake, alert , and oriented  Airway & Oxygen Therapy: Patient Spontanous Breathing and Patient connected to face mask oxygen  Post-op Assessment: Report given to RN and Post -op Vital signs reviewed and stable  Post vital signs: Reviewed and stable  Last Vitals:  Vitals Value Taken Time  BP 113/69 12/31/21 1145  Temp    Pulse 86 12/31/21 1146  Resp 16 12/31/21 1146  SpO2 95 % 12/31/21 1146  Vitals shown include unvalidated device data.  Last Pain:  Vitals:   12/31/21 0945  TempSrc:   PainSc: 0-No pain         Complications: No notable events documented.

## 2022-01-01 DIAGNOSIS — M1712 Unilateral primary osteoarthritis, left knee: Secondary | ICD-10-CM | POA: Diagnosis not present

## 2022-01-01 LAB — CBC
HCT: 28.8 % — ABNORMAL LOW (ref 36.0–46.0)
Hemoglobin: 8.8 g/dL — ABNORMAL LOW (ref 12.0–15.0)
MCH: 20.9 pg — ABNORMAL LOW (ref 26.0–34.0)
MCHC: 30.6 g/dL (ref 30.0–36.0)
MCV: 68.4 fL — ABNORMAL LOW (ref 80.0–100.0)
Platelets: 163 10*3/uL (ref 150–400)
RBC: 4.21 MIL/uL (ref 3.87–5.11)
RDW: 15.3 % (ref 11.5–15.5)
WBC: 12.4 10*3/uL — ABNORMAL HIGH (ref 4.0–10.5)
nRBC: 0 % (ref 0.0–0.2)

## 2022-01-01 LAB — BASIC METABOLIC PANEL
Anion gap: 6 (ref 5–15)
BUN: 14 mg/dL (ref 8–23)
CO2: 23 mmol/L (ref 22–32)
Calcium: 8.9 mg/dL (ref 8.9–10.3)
Chloride: 108 mmol/L (ref 98–111)
Creatinine, Ser: 0.6 mg/dL (ref 0.44–1.00)
GFR, Estimated: >60 mL/min (ref >60)
Glucose, Bld: 254 mg/dL — ABNORMAL HIGH (ref 70–99)
Potassium: 4.1 mmol/L (ref 3.5–5.1)
Sodium: 137 mmol/L (ref 135–145)

## 2022-01-01 MED ORDER — ASPIRIN 81 MG PO CHEW: 81.0000 mg | CHEWABLE_TABLET | Freq: Two times a day (BID) | ORAL | 0 refills | Status: AC

## 2022-01-01 MED ORDER — CELECOXIB 200 MG PO CAPS: 200.0000 mg | ORAL_CAPSULE | Freq: Two times a day (BID) | ORAL | 1 refills | Status: DC | PRN

## 2022-01-01 MED ORDER — OXYCODONE HCL 10 MG PO TABS: 5.0000 mg | ORAL_TABLET | Freq: Four times a day (QID) | ORAL | 0 refills | Status: DC | PRN

## 2022-01-01 MED ORDER — METHOCARBAMOL 500 MG PO TABS: 500.0000 mg | ORAL_TABLET | Freq: Four times a day (QID) | ORAL | 1 refills | Status: DC | PRN

## 2022-01-01 NOTE — TOC Transition Note (Signed)
Transition of Care [facility]) - CM/SW Discharge Note  Patient Details  Name: Shannon Jennings MRN: [id number] Date of Birth: [birth year]  [facility] CM/SW Contact:  Sherie Don, LCSW Phone Number: 01/01/2022, 12:33 PM  Clinical Narrative: Patient will discharge home after working with PT. CSW spoke with patient regarding discharge plan and needs. Patient reported she wants to be set up with [facility] and OPPT through [facility] in [place], but the office is closed for the weekend. CSW updated orthopedist and the patient will need to call Dr. Trevor Mace office on January 04, 2022 to have orders sent to [facility]. Patient updated regarding PT referral. Patient reported she has a rolling walker at home and asked about a 3N1. Patient prefers to buy a toilet riser herself rather than private paying for a 3N1. TOC signing off.    Final next level of care: Home w Home Health Services Barriers to Discharge: Other (must enter comment) (Patient needs HH and OPPT set up through [facility] in [place] [employer], but the office is closed over the weekend.)  Patient Goals and CMS Choice Choice offered to / list presented to : NA  Discharge Plan and Services Additional resources added to the After Visit Summary for         DME Arranged: N/A DME Agency: NA  Social Determinants of Health ([place]) Interventions [place]: No Food Insecurity (12/31/2021)  Housing: Low Risk  (12/31/2021)  Transportation Needs: No Transportation Needs (12/31/2021)  Utilities: Not At Risk (12/31/2021)  Tobacco Use: Low Risk  (12/31/2021)   Readmission Risk Interventions     No data to display

## 2022-01-01 NOTE — Discharge Summary (Signed)
Patient ID: Shannon Jennings MRN: [id number] DOB/AGE: 1960/07/07 61 y.o.  Admit date: 12/31/2021 Discharge date: 01/01/2022  Admission Diagnoses:  Principal Problem:   Unilateral primary osteoarthritis, left knee Active Problems:   Status post total left knee replacement   Discharge Diagnoses:  Same  Past Medical History:  Diagnosis Date   Abnormally small mouth    Complication of anesthesia    Coronary artery disease    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 x 4 yr.   Hyperthyroidism    Hypothyroidism    Osteoarthritis    S/P coronary artery stent placement 04/2011   states right coronary artery   Thalassemia minor     Surgeries: Procedure(s): LEFT TOTAL KNEE ARTHROPLASTY on 12/31/2021   Consultants:   Discharged Condition: Improved  Hospital Course: Shannon Jennings is an 61 y.o. female who was admitted 12/31/2021 for operative treatment ofUnilateral primary osteoarthritis, left knee. Patient has severe unremitting pain that affects sleep, daily activities, and work/hobbies. After pre-op clearance the patient was taken to the operating room on 12/31/2021 and underwent  Procedure(s): LEFT TOTAL KNEE ARTHROPLASTY.    Patient was given perioperative antibiotics:  Anti-infectives (From admission, onward)    Start     Dose/Rate Route Frequency Ordered Stop   12/31/21 1600  ceFAZolin (ANCEF) IVPB 1 g/50 mL premix        1 g 100 mL/hr over 30 Minutes Intravenous Every 6 hours 12/31/21 1258 12/31/21 2047   12/31/21 0800  ceFAZolin (ANCEF) IVPB 2g/100 mL premix        2 g 200 mL/hr over 30 Minutes Intravenous On call to O.R. 12/31/21 0748 12/31/21 1009        Patient was given sequential compression  devices, early ambulation, and chemoprophylaxis to prevent DVT.  Patient benefited maximally from hospital stay and there were no complications.    Recent vital signs: Patient Vitals for the past 24 hrs:  BP Temp Temp src Pulse Resp SpO2  01/01/22 0844 133/65 98.4 F (36.9 C) Oral 94 16 96 %  01/01/22 0812 129/66 -- -- 88 -- --  01/01/22 0656 123/65 98 F (36.7 C) Oral 86 16 97 %  01/01/22 0138 (!) 114/59 97.6 F (36.4 C) Oral 81 16 93 %  12/31/21 2045 (!) 142/69 (!) 97.5 F (36.4 C) Oral 88 18 95 %  12/31/21 1751 122/70 97.6 F (36.4 C) -- 80 16 95 %  12/31/21 1508 131/75 98 F (36.7 C) Oral 77 18 94 %  12/31/21 1306 (!) 142/80 98 F (36.7 C) Oral 76 16 97 %  12/31/21 1245 133/72 (!) 97.3 F (36.3 C) -- 73 15 96 %  12/31/21 1230 (!) 141/76 -- -- 70 13 96 %  12/31/21 1215 136/76 -- -- 72 13 98 %  12/31/21 1200 130/71 -- -- 78 11 95 %  12/31/21 1145 113/69 (!) 97.4 F (36.3 C) -- 84 19 95 %  12/31/21 0945 119/80 -- -- 75 10 96 %  12/31/21 0940 127/68 -- -- 69 14 94 %  12/31/21 0935 123/67 -- -- 77 13 93 %  12/31/21 0930 (!) 155/77 -- -- 67  15 98 %     Recent laboratory studies:  Recent Labs    01/01/22 0354  WBC 12.4*  HGB 8.8*  HCT 28.8*  PLT 163  NA 137  K 4.1  CL 108  CO2 23  BUN 14  CREATININE 0.60  GLUCOSE 254*  CALCIUM 8.9     Discharge Medications:   Allergies as of 01/01/2022       Reactions   Tizanidine    Chest pain    Toradol [ketorolac Tromethamine]    Chest pain    Trokendi Xr [topiramate Er]    Double vision    Dilaudid [hydromorphone Hcl] Other (See Comments)   manic   Penicillins Rash   Has patient had a PCN reaction causing immediate rash, facial/tongue/throat swelling, SOB or lightheadedness with hypotension: No Has patient had a PCN reaction causing severe rash involving mucus membranes or skin necrosis: unknown Has patient had a PCN reaction that required hospitalization No Has patient had a PCN reaction occurring within the last  10 years: No If all of the above answers are "NO", then may proceed with Cephalosporin use.   Sulfa Antibiotics Rash        Medication List     STOP taking these medications    aspirin EC 81 MG tablet Replaced by: aspirin 81 MG chewable tablet   traMADol 50 MG tablet Commonly known as: ULTRAM       TAKE these medications    aspirin 81 MG chewable tablet Chew 1 tablet (81 mg total) by mouth 2 (two) times daily. Replaces: aspirin EC 81 MG tablet   carvedilol 25 MG tablet Commonly known as: COREG Take 25 mg by mouth 2 (two) times daily.   celecoxib 200 MG capsule Commonly known as: CeleBREX Take 1 capsule (200 mg total) by mouth 2 (two) times daily between meals as needed. What changed:  when to take this reasons to take this   DULoxetine 30 MG capsule Commonly known as: CYMBALTA TAKE 1 CAPSULE BY MOUTH EVERY DAY   Hemocyte Plus 106-1 MG Caps Take 1 capsule by mouth every evening.   levothyroxine 100 MCG tablet Commonly known as: SYNTHROID Take 100 mcg by mouth every morning.   methocarbamol 500 MG tablet Commonly known as: ROBAXIN Take 1 tablet (500 mg total) by mouth every 6 (six) hours as needed. What changed: when to take this   Oxycodone HCl 10 MG Tabs Take 0.5-1 tablets (5-10 mg total) by mouth every 6 (six) hours as needed for breakthrough pain. No more than 6 tablets per day.   rosuvastatin 10 MG tablet Commonly known as: CRESTOR Take 10 mg by mouth daily.   SUMAtriptan 50 MG tablet Commonly known as: IMITREX Take 25 mg by mouth every 2 (two) hours as needed for migraine.               Durable Medical Equipment  (From admission, onward)           Start     Ordered   12/31/21 1259  DME 3 n 1  Once        12/31/21 1258   12/31/21 1259  DME Walker rolling  Once       Question Answer Comment  Walker: With 5 Inch Wheels   Patient needs a walker to treat with the following condition Status post total left knee replacement       12/31/21 1258            Diagnostic Studies:  DG Knee Left Port  Result Date: 12/31/2021 CLINICAL DATA:  Status post total left knee arthroplasty. EXAM: PORTABLE LEFT KNEE - 1-2 VIEW COMPARISON:  Left knee radiographs 01/27/2021 FINDINGS: Interval total left knee arthroplasty. No perihardware lucency is seen to indicate hardware failure or loosening. Expected postoperative changes including intra-articular and subcutaneous air. Mild-to-moderate joint effusion. There are anterior surgical skin staples. No acute fracture or dislocation. IMPRESSION: Interval total left knee arthroplasty without evidence of hardware failure. Electronically Signed   By: Yvonne Kendall M.D.   On: 12/31/2021 13:44    Disposition: Discharge disposition: 01-Home or [place]     Mcarthur Rossetti, MD Follow up in 2 week(s).   Specialty: Orthopedic Surgery Contact information: [address] [place] [address] [phone]                  Signed: Mcarthur Rossetti 01/01/2022, 9:11 AM

## 2022-01-01 NOTE — Plan of Care (Signed)
  Problem: Coping: Goal: Level of anxiety will decrease 01/01/2022 0204 by Blase Mess, RN Outcome: Progressing 01/01/2022 0203 by Blase Mess, RN Outcome: Progressing   Problem: Pain Managment: Goal: General experience of comfort will improve 01/01/2022 0204 by Blase Mess, RN Outcome: Progressing 01/01/2022 0203 by Blase Mess, RN Outcome: Progressing

## 2022-01-01 NOTE — Progress Notes (Signed)
Patient sleeping soundly at this time, no acute distress noted, will continue to monitor.

## 2022-01-01 NOTE — Plan of Care (Signed)
  Problem: Pain Management: Goal: Pain level will decrease with appropriate interventions Outcome: Progressing   Problem: Coping: Goal: Level of anxiety will decrease Outcome: Progressing

## 2022-01-01 NOTE — Progress Notes (Signed)
Physical Therapy Treatment Patient Details Name: Shannon Jennings MRN: [id number] DOB: [birth year] Today's Date: 01/01/2022   History of Present Illness Pt is a 61yo female presenting s/p L-TKA on 12/31/21. PMH: R-TKA 2018, fibromylagia, HTN, Graves' disease    PT Comments    Pt seen POD1 received in recliner motivated to participate in therapy despite pain. Required min guard for transfers, and for ambulation in hallway with RW 126f. Provided HEP and pt completed exercises with safe form and minimal cuing, use of gait belt to self-assist with majority of exercises. All education completed and pt has no further questions. Pt has met mobility goals for safe discharge home, PT is signing off, should needs change please reconsult. Thank you for this referral.    Recommendations for follow up therapy are one component of a multi-disciplinary discharge planning process, led by the attending physician.  Recommendations may be updated based on patient status, additional functional criteria and insurance authorization.  Follow Up Recommendations  Follow physician's recommendations for discharge plan and follow up therapies     Assistance Recommended at Discharge Frequent or constant Supervision/Assistance  Patient can return home with the following A little help with walking and/or transfers;A little help with bathing/dressing/bathroom;Assistance with cooking/housework;Assist for transportation;Help with stairs or ramp for entrance   Equipment Recommendations  None recommended by PT    Recommendations for Other Services       Precautions / Restrictions Precautions Precautions: Knee;Fall Precaution Booklet Issued: No Precaution Comments: no pillow under the knee Restrictions Weight Bearing Restrictions: No Other Position/Activity Restrictions: wbat     Mobility  Bed Mobility               General bed mobility comments: Pt OOB in recliner at entry and exit    Transfers Overall  transfer level: Needs assistance Equipment used: Rolling walker (2 wheels) Transfers: Sit to/from Stand Sit to Stand: Min guard           General transfer comment: For safety only, VCs for hand placement and powering up. Tested pt's L quad control without L-KI and pt had good control with weight shift and single leg stance using BUE to provide additional support    Ambulation/Gait Ambulation/Gait assistance: Min guard Gait Distance (Feet): 120 Feet Assistive device: Rolling walker (2 wheels) Gait Pattern/deviations: Step-to pattern, Decreased stance time - left, Decreased step length - left Gait velocity: decreased     General Gait Details: Pt ambulated with RW and min guard, no physical assist required or overt LOB noted.   Stairs             Wheelchair Mobility    Modified Rankin (Stroke Patients Only)       Balance Overall balance assessment: Needs assistance Sitting-balance support: Feet supported, No upper extremity supported Sitting balance-Leahy Scale: Good     Standing balance support: Reliant on assistive device for balance, During functional activity, Bilateral upper extremity supported Standing balance-Leahy Scale: Poor                              Cognition Arousal/Alertness: Awake/alert Behavior During Therapy: WFL for tasks assessed/performed Overall Cognitive Status: Within Functional Limits for tasks assessed                                          Exercises Total Joint Exercises Ankle  Circles/Pumps: AROM, Both, 20 reps Quad Sets: AROM, Left, 10 reps Short Arc Quad: Left, 10 reps, AAROM Heel Slides: AAROM, Left, 10 reps Hip ABduction/ADduction: AAROM, Left, 10 reps Straight Leg Raises: AAROM, Left, 10 reps Goniometric ROM: -5-30deg by gross visual approximation    General Comments        Pertinent Vitals/Pain Pain Assessment Pain Assessment: 0-10 Pain Score: 7  Pain Location: left knee Pain  Descriptors / Indicators: Operative site guarding Pain Intervention(s): Monitored during session, Repositioned, Ice applied    Home Living                          Prior Function            PT Goals (current goals can now be found in the care plan section) Acute Rehab PT Goals Patient Stated Goal: to walk without pain PT Goal Formulation: With patient Time For Goal Achievement: 01/07/22 Potential to Achieve Goals: Good Progress towards PT goals: Progressing toward goals    Frequency    7X/week      PT Plan Current plan remains appropriate    Co-evaluation              AM-PAC PT "6 Clicks" Mobility   Outcome Measure  Help needed turning from your back to your side while in a flat bed without using bedrails?: None Help needed moving from lying on your back to sitting on the side of a flat bed without using bedrails?: None Help needed moving to and from a bed to a chair (including a wheelchair)?: A Little Help needed standing up from a chair using your arms (e.g., wheelchair or bedside chair)?: A Little Help needed to walk in hospital room?: A Little Help needed climbing 3-5 steps with a railing? : A Little 6 Click Score: 20    End of Session Equipment Utilized During Treatment: Gait belt Activity Tolerance: Patient limited by pain Patient left: in chair;with call bell/phone within reach;with chair alarm set Nurse Communication: Mobility status;Patient requests pain meds PT Visit Diagnosis: Pain;Difficulty in walking, not elsewhere classified (R26.2) Pain - Right/Left: Left Pain - part of body: Knee     Time: 6063-0160 PT Time Calculation (min) (ACUTE ONLY): 25 min  Charges:  $Gait Training: 8-22 mins $Therapeutic Exercise: 8-22 mins                     Coolidge Breeze, PT, DPT [facility] Department Office: [phone] Weekend pager: [phone]   Coolidge Breeze 01/01/2022, 9:59 AM

## 2022-01-01 NOTE — Progress Notes (Signed)
Subjective: 1 Day Post-Op Procedure(s) (LRB): LEFT TOTAL KNEE ARTHROPLASTY (Left) Patient reports pain as moderate.    Objective: Vital signs in last 24 hours: Temp:  [97.3 F (36.3 C)-98.4 F (36.9 C)] 98.4 F (36.9 C) (12/30 0844) Pulse Rate:  [67-94] 94 (12/30 0844) Resp:  [10-19] 16 (12/30 0844) BP: (113-155)/(59-80) 133/65 (12/30 0844) SpO2:  [93 %-98 %] 96 % (12/30 0844)  Intake/Output from previous day: 12/29 0701 - 12/30 0700 In: 4057.4 [P.O.:1440; I.V.:2567.4; IV Piggyback:50] Out: 6333 [Urine:4550; Blood:25] Intake/Output this shift: Total I/O In: 240 [P.O.:240] Out: 175 [Urine:175]  Recent Labs    01/01/22 0354  HGB 8.8*   Recent Labs    01/01/22 0354  WBC 12.4*  RBC 4.21  HCT 28.8*  PLT 163   Recent Labs    01/01/22 0354  NA 137  K 4.1  CL 108  CO2 23  BUN 14  CREATININE 0.60  GLUCOSE 254*  CALCIUM 8.9   No results for input(s): "LABPT", "INR" in the last 72 hours.  Sensation intact distally Intact pulses distally Dorsiflexion/Plantar flexion intact Incision: dressing C/D/I No cellulitis present Compartment soft   Assessment/Plan: 1 Day Post-Op Procedure(s) (LRB): LEFT TOTAL KNEE ARTHROPLASTY (Left) Up with therapy Discharge home with home health      Shannon Jennings 01/01/2022, 9:07 AM

## 2022-01-01 NOTE — Discharge Instructions (Signed)

## 2022-01-01 NOTE — Progress Notes (Signed)
The patient is alert and oriented and has been seen by her physician. The orders for discharge were written. IV has been removed. Went over discharge instructions with patient and family. She is being discharged via wheelchair with all of her belongings.  

## 2022-01-01 NOTE — Plan of Care (Signed)
  Problem: Education: Goal: Knowledge of General Education information will improve Description Including pain rating scale, medication(s)/side effects and non-pharmacologic comfort measures Outcome: Progressing   

## 2022-01-04 ENCOUNTER — Telehealth: Payer: Self-pay | Admitting: Orthopaedic Surgery

## 2022-01-04 ENCOUNTER — Other Ambulatory Visit: Payer: Self-pay

## 2022-01-04 ENCOUNTER — Encounter (HOSPITAL_COMMUNITY): Payer: Self-pay | Admitting: Orthopaedic Surgery

## 2022-01-04 DIAGNOSIS — Z96652 Presence of left artificial knee joint: Secondary | ICD-10-CM

## 2022-01-04 NOTE — Telephone Encounter (Signed)
PATIENT WANTED TO LET YOU KNOW THAT HER PT SHE NEEDS TO USE [facility] FOR BECAUSE THAT IS WHAT HER INSURANCE PAYS FOR

## 2022-01-04 NOTE — Telephone Encounter (Signed)
Order put in for [facility] in [place]

## 2022-01-06 ENCOUNTER — Telehealth: Payer: Self-pay | Admitting: Orthopaedic Surgery

## 2022-01-06 ENCOUNTER — Other Ambulatory Visit: Payer: Self-pay | Admitting: Orthopaedic Surgery

## 2022-01-06 MED ORDER — OXYCODONE HCL 10 MG PO TABS: 5.0000 mg | ORAL_TABLET | Freq: Four times a day (QID) | ORAL | 0 refills | Status: DC | PRN

## 2022-01-06 NOTE — Telephone Encounter (Signed)
Please call in rx to [facility] in [place] ([address]) tomorrow for pain meds..Oxycodone '10mg'$ 

## 2022-01-13 ENCOUNTER — Other Ambulatory Visit: Payer: Self-pay | Admitting: Orthopaedic Surgery

## 2022-01-13 ENCOUNTER — Encounter: Payer: Self-pay | Admitting: Orthopaedic Surgery

## 2022-01-13 ENCOUNTER — Telehealth: Payer: Self-pay | Admitting: Orthopaedic Surgery

## 2022-01-13 ENCOUNTER — Ambulatory Visit (INDEPENDENT_AMBULATORY_CARE_PROVIDER_SITE_OTHER): Payer: Self-pay | Admitting: Orthopaedic Surgery

## 2022-01-13 DIAGNOSIS — Z96652 Presence of left artificial knee joint: Secondary | ICD-10-CM

## 2022-01-13 MED ORDER — OXYCODONE HCL 10 MG PO TABS: 5.0000 mg | ORAL_TABLET | Freq: Four times a day (QID) | ORAL | 0 refills | Status: DC | PRN

## 2022-01-13 NOTE — Telephone Encounter (Signed)
Pt called requesting Dr Ninfa Linden [facility] [address]. Pt states her regular pharmacy is having a problem with their vault and unable to get. Pt phone number is [phone].

## 2022-01-13 NOTE — Progress Notes (Signed)
The patient is a 62 year old female well-known to [place].  She is here today for first postoperative visit status post a left total knee arthroplasty.  Somehow no therapy has been set up for her at all.  She has been able to push this on her own given the fact that she has had a knee replacement her right side.  However we both agree she would benefit from outpatient therapy.  On my exam she lacks full extension by 3 degrees and I can flex her to almost 90 degrees.  Her calf is soft.  She has good motion of her foot and ankle.  Her incision looks good so the staples are removed and Steri-Strips applied.  It is essential we get her in outpatient physical therapy so I gave her prescription for that for back at home in [place].  I did refill her oxycodone as well.  Will see her back in 4 weeks to see how she is doing overall from a mobility and range of motion standpoint but no x-rays are needed.

## 2022-01-19 ENCOUNTER — Telehealth: Payer: Self-pay | Admitting: Orthopaedic Surgery

## 2022-01-19 ENCOUNTER — Other Ambulatory Visit: Payer: Self-pay | Admitting: Orthopaedic Surgery

## 2022-01-19 MED ORDER — OXYCODONE HCL 10 MG PO TABS: 5.0000 mg | ORAL_TABLET | Freq: Four times a day (QID) | ORAL | 0 refills | Status: DC | PRN

## 2022-01-19 NOTE — Telephone Encounter (Signed)
Patient requesting her Oxycodone refill, states she has called 2 time yesterday with no response

## 2022-01-26 ENCOUNTER — Other Ambulatory Visit: Payer: Self-pay | Admitting: Orthopaedic Surgery

## 2022-01-28 ENCOUNTER — Other Ambulatory Visit: Payer: Self-pay | Admitting: Orthopaedic Surgery

## 2022-01-28 ENCOUNTER — Telehealth: Payer: Self-pay | Admitting: Orthopaedic Surgery

## 2022-01-28 MED ORDER — OXYCODONE HCL 10 MG PO TABS: 5.0000 mg | ORAL_TABLET | Freq: Four times a day (QID) | ORAL | 0 refills | Status: DC | PRN

## 2022-01-28 NOTE — Telephone Encounter (Signed)
Patient called needing Rx refilled Oxycodone. The number to contact patient is [phone]

## 2022-01-31 ENCOUNTER — Telehealth: Payer: Self-pay | Admitting: Orthopaedic Surgery

## 2022-01-31 ENCOUNTER — Other Ambulatory Visit: Payer: Self-pay | Admitting: Orthopaedic Surgery

## 2022-01-31 MED ORDER — OXYCODONE HCL 10 MG PO TABS: 5.0000 mg | ORAL_TABLET | Freq: Four times a day (QID) | ORAL | 0 refills | Status: DC | PRN

## 2022-01-31 NOTE — Telephone Encounter (Signed)
Patient called asked if the Rx for Oxycodone can be sent to the [facility] in [place]. The number to contact patient is [phone]  Please see previous message

## 2022-02-07 ENCOUNTER — Other Ambulatory Visit: Payer: Self-pay | Admitting: Orthopaedic Surgery

## 2022-02-07 ENCOUNTER — Telehealth: Payer: Self-pay | Admitting: Orthopaedic Surgery

## 2022-02-07 MED ORDER — OXYCODONE HCL 10 MG PO TABS: 5.0000 mg | ORAL_TABLET | Freq: Four times a day (QID) | ORAL | 0 refills | Status: DC | PRN

## 2022-02-07 MED ORDER — METHOCARBAMOL 500 MG PO TABS: 500.0000 mg | ORAL_TABLET | Freq: Four times a day (QID) | ORAL | 0 refills | Status: DC | PRN

## 2022-02-07 NOTE — Telephone Encounter (Signed)
Patient requesting oxycodone refill and Methocarbamol sent to [facility] in [place]

## 2022-02-14 ENCOUNTER — Ambulatory Visit (INDEPENDENT_AMBULATORY_CARE_PROVIDER_SITE_OTHER): Payer: PRIVATE HEALTH INSURANCE | Admitting: Orthopaedic Surgery

## 2022-02-14 ENCOUNTER — Encounter: Payer: Self-pay | Admitting: Orthopaedic Surgery

## 2022-02-14 DIAGNOSIS — Z96652 Presence of left artificial knee joint: Secondary | ICD-10-CM

## 2022-02-14 MED ORDER — HYDROCODONE-ACETAMINOPHEN 5-325 MG PO TABS: 1.0000 | ORAL_TABLET | Freq: Four times a day (QID) | ORAL | 0 refills | Status: DC | PRN

## 2022-02-14 MED ORDER — CYCLOBENZAPRINE HCL 10 MG PO TABS: 10.0000 mg | ORAL_TABLET | Freq: Three times a day (TID) | ORAL | 0 refills | Status: DC | PRN

## 2022-02-14 NOTE — Progress Notes (Signed)
The patient comes in today at 6 weeks status post a left total knee arthroplasty.  She just turned 62.  She is having hard time with the knee in terms of just pain overall but has been very active in physical therapy and pushing to get her knee bending and moving.  She is ambulating without an assistive device.  On exam she still has some moderate swelling to be expected but overall incision looks good.  Her extension is full with her left knee and her flexion is to past 90 degrees.  We talked about continuing therapy.  I need to wean her from oxycodone now to hydrocodone and will try Flexeril instead of Robaxin because her spasms were quite significant she states.  I would like to see her back in 4 weeks to see how she is doing overall.  I will like a standing AP and lateral of her left knee at that visit.

## 2022-02-18 ENCOUNTER — Telehealth: Payer: Self-pay | Admitting: Orthopaedic Surgery

## 2022-02-18 ENCOUNTER — Other Ambulatory Visit: Payer: Self-pay | Admitting: Orthopaedic Surgery

## 2022-02-18 MED ORDER — METHOCARBAMOL 500 MG PO TABS: 500.0000 mg | ORAL_TABLET | Freq: Four times a day (QID) | ORAL | 1 refills | Status: DC | PRN

## 2022-02-18 MED ORDER — HYDROCODONE-ACETAMINOPHEN 5-325 MG PO TABS: 1.0000 | ORAL_TABLET | Freq: Four times a day (QID) | ORAL | 0 refills | Status: DC | PRN

## 2022-02-18 NOTE — Telephone Encounter (Signed)
Patient called needing Rx refilled Oxycodone and Robaxin. Patient said she can not take Flexeril. Patient said Flexeril causes heart palpitations.The number to contact patient is [phone]

## 2022-02-24 ENCOUNTER — Other Ambulatory Visit: Payer: Self-pay | Admitting: Orthopaedic Surgery

## 2022-02-24 ENCOUNTER — Telehealth: Payer: Self-pay | Admitting: Orthopaedic Surgery

## 2022-02-24 MED ORDER — HYDROCODONE-ACETAMINOPHEN 5-325 MG PO TABS: 1.0000 | ORAL_TABLET | Freq: Four times a day (QID) | ORAL | 0 refills | Status: DC | PRN

## 2022-02-24 NOTE — Telephone Encounter (Signed)
Pt called requesting a refill of hydrocodone be sent to [facility]. Pt phone number is [phone].

## 2022-02-24 NOTE — Telephone Encounter (Signed)
Please advise S/p left tka 12/31/21

## 2022-02-25 ENCOUNTER — Other Ambulatory Visit: Payer: Self-pay | Admitting: Orthopaedic Surgery

## 2022-02-28 ENCOUNTER — Other Ambulatory Visit: Payer: Self-pay

## 2022-02-28 MED ORDER — CELECOXIB 200 MG PO CAPS: ORAL_CAPSULE | ORAL | 0 refills | Status: DC

## 2022-03-02 ENCOUNTER — Other Ambulatory Visit: Payer: Self-pay | Admitting: Orthopaedic Surgery

## 2022-03-02 ENCOUNTER — Telehealth: Payer: Self-pay | Admitting: Orthopaedic Surgery

## 2022-03-02 MED ORDER — METHOCARBAMOL 500 MG PO TABS: 500.0000 mg | ORAL_TABLET | Freq: Four times a day (QID) | ORAL | 1 refills | Status: DC | PRN

## 2022-03-02 MED ORDER — HYDROCODONE-ACETAMINOPHEN 5-325 MG PO TABS: 1.0000 | ORAL_TABLET | Freq: Four times a day (QID) | ORAL | 0 refills | Status: DC | PRN

## 2022-03-02 NOTE — Telephone Encounter (Signed)
Patient called needing Rx refilled Hydrocodone and Robaxin. The number to contact patient is [phone]

## 2022-03-09 ENCOUNTER — Other Ambulatory Visit: Payer: Self-pay | Admitting: Orthopaedic Surgery

## 2022-03-09 ENCOUNTER — Telehealth: Payer: Self-pay | Admitting: Orthopaedic Surgery

## 2022-03-09 MED ORDER — HYDROCODONE-ACETAMINOPHEN 5-325 MG PO TABS: 1.0000 | ORAL_TABLET | Freq: Four times a day (QID) | ORAL | 0 refills | Status: DC | PRN

## 2022-03-09 NOTE — Telephone Encounter (Signed)
S/p left tka on 12/31/21

## 2022-03-09 NOTE — Telephone Encounter (Signed)
Pt called requesting a refill of hydrocodone. Please send to pharmacy on file. Pt phone number is [phone].

## 2022-03-10 ENCOUNTER — Encounter: Payer: Self-pay | Admitting: Radiology

## 2022-03-14 ENCOUNTER — Telehealth: Payer: Self-pay | Admitting: Orthopaedic Surgery

## 2022-03-14 ENCOUNTER — Other Ambulatory Visit: Payer: Self-pay | Admitting: Physician Assistant

## 2022-03-14 MED ORDER — METHOCARBAMOL 500 MG PO TABS: 500.0000 mg | ORAL_TABLET | Freq: Two times a day (BID) | ORAL | 0 refills | Status: DC | PRN

## 2022-03-14 NOTE — Telephone Encounter (Signed)
Pt called requesting a refill of methocarbamol. Please send to pharmacy on file. Pt phone number is [phone].

## 2022-03-14 NOTE — Telephone Encounter (Signed)
Shannon Jennings patient but I went ahead and sent in

## 2022-03-15 ENCOUNTER — Other Ambulatory Visit: Payer: Self-pay | Admitting: Physician Assistant

## 2022-03-15 ENCOUNTER — Telehealth: Payer: Self-pay | Admitting: Orthopaedic Surgery

## 2022-03-15 MED ORDER — HYDROCODONE-ACETAMINOPHEN 5-325 MG PO TABS: 1.0000 | ORAL_TABLET | Freq: Four times a day (QID) | ORAL | 0 refills | Status: DC | PRN

## 2022-03-15 NOTE — Telephone Encounter (Signed)
Called and advised pt.

## 2022-03-15 NOTE — Telephone Encounter (Signed)
Patient requesting a refill on her hydrocodone '5mg'$ .Marland Kitchen

## 2022-03-21 ENCOUNTER — Ambulatory Visit: Payer: PRIVATE HEALTH INSURANCE | Admitting: Orthopaedic Surgery

## 2022-03-21 ENCOUNTER — Other Ambulatory Visit (INDEPENDENT_AMBULATORY_CARE_PROVIDER_SITE_OTHER): Payer: PRIVATE HEALTH INSURANCE

## 2022-03-21 ENCOUNTER — Encounter: Payer: Self-pay | Admitting: Orthopaedic Surgery

## 2022-03-21 DIAGNOSIS — Z96652 Presence of left artificial knee joint: Secondary | ICD-10-CM

## 2022-03-21 MED ORDER — DICLOFENAC SODIUM 75 MG PO TBEC: 75.0000 mg | DELAYED_RELEASE_TABLET | Freq: Two times a day (BID) | ORAL | 1 refills | Status: DC | PRN

## 2022-03-21 MED ORDER — TRAMADOL HCL ER 100 MG PO TB24: 100.0000 mg | ORAL_TABLET | Freq: Four times a day (QID) | ORAL | 0 refills | Status: DC | PRN

## 2022-03-21 NOTE — Progress Notes (Signed)
The patient is now right at 3 months status post a left knee replacement.  She has a history of a right knee replacement.  She has been making progress with therapy.  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  She needs a steroid be restricted for only 8-hour days.  She has been still going through extensive physical therapy for her left knee.  On exam her extension is almost full and her flexion is to just past 90 degrees.  Her right knee flexes almost fully.  She has still swelling to be expected but no evidence of infection or instability.  2 views of the left knee show well-seated total knee arthroplasty with no complicating features.  At this point follow-up will be in 6 weeks after continued physical therapy.  We will try extended release tramadol and diclofenac for her.  No x-rays are needed at the next visit.

## 2022-03-24 ENCOUNTER — Telehealth: Payer: Self-pay | Admitting: Orthopaedic Surgery

## 2022-03-24 ENCOUNTER — Other Ambulatory Visit: Payer: Self-pay

## 2022-03-24 MED ORDER — METHOCARBAMOL 500 MG PO TABS: 500.0000 mg | ORAL_TABLET | Freq: Two times a day (BID) | ORAL | 0 refills | Status: DC | PRN

## 2022-03-24 NOTE — Telephone Encounter (Signed)
refilled 

## 2022-03-24 NOTE — Telephone Encounter (Signed)
Requesting a refill on Methocarbamol, please advise

## 2022-04-06 ENCOUNTER — Telehealth: Payer: Self-pay | Admitting: Orthopaedic Surgery

## 2022-04-06 ENCOUNTER — Other Ambulatory Visit: Payer: Self-pay | Admitting: Orthopaedic Surgery

## 2022-04-06 MED ORDER — METHOCARBAMOL 500 MG PO TABS: 500.0000 mg | ORAL_TABLET | Freq: Two times a day (BID) | ORAL | 0 refills | Status: DC | PRN

## 2022-04-06 MED ORDER — TRAMADOL HCL ER 100 MG PO TB24: 100.0000 mg | ORAL_TABLET | Freq: Four times a day (QID) | ORAL | 0 refills | Status: DC | PRN

## 2022-04-06 NOTE — Telephone Encounter (Signed)
Patient called. She would like Tramadol called in for her and Robaxin called in for her. Her call back number is [phone]

## 2022-04-06 NOTE — Addendum Note (Signed)
Addended by: Robyne Peers on: 04/06/2022 01:13 PM   Modules accepted: Orders

## 2022-04-08 ENCOUNTER — Other Ambulatory Visit: Payer: Self-pay | Admitting: Orthopaedic Surgery

## 2022-04-08 ENCOUNTER — Telehealth: Payer: Self-pay | Admitting: Orthopaedic Surgery

## 2022-04-08 MED ORDER — TRAMADOL HCL ER 100 MG PO TB24: 100.0000 mg | ORAL_TABLET | Freq: Four times a day (QID) | ORAL | 0 refills | Status: DC | PRN

## 2022-04-08 NOTE — Telephone Encounter (Signed)
Patient called advised the pharmacy said they do not have Tramadol in stock   [facility] [address] [facility] [place] Patient said the [facility] would [facility] [address] [facility] [place]. Patient said the pharmacy will not transfer the Rx due to being a scheduled medication. The number to contact patient is [phone]

## 2022-04-20 ENCOUNTER — Telehealth: Payer: Self-pay | Admitting: Orthopaedic Surgery

## 2022-04-20 ENCOUNTER — Other Ambulatory Visit: Payer: Self-pay | Admitting: Orthopaedic Surgery

## 2022-04-20 MED ORDER — METHOCARBAMOL 500 MG PO TABS: 500.0000 mg | ORAL_TABLET | Freq: Two times a day (BID) | ORAL | 0 refills | Status: DC | PRN

## 2022-04-20 NOTE — Telephone Encounter (Signed)
Patient called. She would like a refill on Robaxin sent to [facility] in [place]. Her call back number is [phone]

## 2022-04-25 ENCOUNTER — Other Ambulatory Visit: Payer: Self-pay | Admitting: Orthopaedic Surgery

## 2022-04-25 ENCOUNTER — Telehealth: Payer: Self-pay | Admitting: Orthopaedic Surgery

## 2022-04-25 MED ORDER — TRAMADOL HCL 50 MG PO TABS: 100.0000 mg | ORAL_TABLET | Freq: Two times a day (BID) | ORAL | 0 refills | Status: DC | PRN

## 2022-04-25 NOTE — Telephone Encounter (Signed)
Patient called. Says she needs the regular Tramadol called in not the extended relief. The pharmacy does no have the extended. Her call back number is [phone]. Wants it sent to  [facility] [address] [place] [phone] Phone: [phone]  Fax: [phone]

## 2022-05-02 ENCOUNTER — Encounter: Payer: Self-pay | Admitting: Orthopaedic Surgery

## 2022-05-02 ENCOUNTER — Ambulatory Visit (INDEPENDENT_AMBULATORY_CARE_PROVIDER_SITE_OTHER): Payer: PRIVATE HEALTH INSURANCE | Admitting: Orthopaedic Surgery

## 2022-05-02 DIAGNOSIS — Z96652 Presence of left artificial knee joint: Secondary | ICD-10-CM | POA: Diagnosis not present

## 2022-05-02 DIAGNOSIS — M25462 Effusion, left knee: Secondary | ICD-10-CM

## 2022-05-02 DIAGNOSIS — M659 Synovitis and tenosynovitis, unspecified: Secondary | ICD-10-CM | POA: Diagnosis not present

## 2022-05-02 MED ORDER — HYDROCODONE-ACETAMINOPHEN 5-325 MG PO TABS: 1.0000 | ORAL_TABLET | Freq: Four times a day (QID) | ORAL | 0 refills | Status: DC | PRN

## 2022-05-02 MED ORDER — DOXYCYCLINE HYCLATE 100 MG PO CAPS: 100.0000 mg | ORAL_CAPSULE | Freq: Two times a day (BID) | ORAL | 0 refills | Status: DC

## 2022-05-02 NOTE — Progress Notes (Signed)
The patient is well-known to me.  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  We replaced her left knee and just December of last year so it has been 4 months.  She is someone with rheumatoid disease and does take Plaquenil but not regularly.  She has developed some redness around her left knee incision as well as swelling.  It has been hurting quite a bit over the last few days and certainly getting worse.  She reports that she feels like she has been febrile as well.  Examination of her left knee does show it is warm and swollen.  There is a red hue near the incision.  I did prepped the knee with Betadine and alcohol and was able to aspirate about 20 cc of bloody fluid from the knee.  This is concerning about the potential for infection.  Fortunately she has not been on any antibiotics at all.  We will send that fluid sample for cell count with Gram stain and culture.  Will also draw a CBC, sed rate and CRP today.  I will send in doxycycline 100 mg twice a day and some hydrocodone.  Will also need to keep her out of work.  As soon as under the results of her labs and the aspiration from her left knee I will contact her and let her know because the potential would be to proceed to surgery later this week for an open synovectomy with polyliner exchange in hopes of retaining the joint.  She understands we will be in touch as soon as we know something.  All questions and concerns were addressed and answered.

## 2022-05-04 ENCOUNTER — Telehealth: Payer: Self-pay | Admitting: Orthopaedic Surgery

## 2022-05-04 ENCOUNTER — Other Ambulatory Visit: Payer: Self-pay

## 2022-05-04 MED ORDER — METHOCARBAMOL 500 MG PO TABS: 500.0000 mg | ORAL_TABLET | Freq: Two times a day (BID) | ORAL | 0 refills | Status: DC | PRN

## 2022-05-04 NOTE — Telephone Encounter (Signed)
Sent to pharmacy 

## 2022-05-04 NOTE — Telephone Encounter (Signed)
Pt called requesting refill of methocarbamol. Please send to [facility] on [address]. Pt phone number is [phone].

## 2022-05-04 NOTE — Patient Instructions (Signed)
DUE TO COVID-19 ONLY TWO VISITORS  (aged 63 and older)  ARE ALLOWED TO COME WITH YOU AND STAY IN THE WAITING ROOM ONLY DURING PRE OP AND PROCEDURE.   **NO VISITORS ARE ALLOWED IN THE SHORT STAY AREA OR RECOVERY ROOM!!**  IF YOU WILL BE ADMITTED INTO THE HOSPITAL YOU ARE ALLOWED ONLY FOUR SUPPORT PEOPLE DURING VISITATION HOURS ONLY (7 AM -8PM)   The support person(s) must pass our screening, gel in and out, and wear a mask at all times, including in the patient's room. Patients must also wear a mask when staff or their support person are in the room. Visitors GUEST BADGE MUST BE WORN VISIBLY  One adult visitor may remain with you overnight and MUST be in the room by 8 P.M.     Your procedure is scheduled on: 05/06/22   Report to [facility] Main Entrance    Report to admitting at : 12:15 PM   Call this number if you have problems the morning of surgery [phone]   Do not eat food :After Midnight.   After Midnight you may have the following liquids until : 11:00 AM DAY OF SURGERY  Water Black Coffee (sugar ok, NO MILK/CREAM OR CREAMERS)  Tea (sugar ok, NO MILK/CREAM OR CREAMERS) regular and decaf                             Plain Jell-O (NO RED)                                           Fruit ices (not with fruit pulp, NO RED)                                     Popsicles (NO RED)                                                                  Juice: apple, WHITE grape, WHITE cranberry Sports drinks like Gatorade (NO RED)   The day of surgery:  Drink ONE (1) Pre-Surgery Clear Ensure at: 11:00 AM the morning of surgery. Drink in one sitting. Do not sip.  This drink was given to you during your hospital  pre-op appointment visit. Nothing else to drink after completing the  Pre-Surgery Clear Ensure or G2.          If you have questions, please contact your surgeon's office.    Oral Hygiene is also important to reduce your risk of infection.                                     Remember - BRUSH YOUR TEETH THE MORNING OF SURGERY WITH YOUR REGULAR TOOTHPASTE  DENTURES WILL BE REMOVED PRIOR TO SURGERY PLEASE DO NOT APPLY "Poly grip" OR ADHESIVES!!!   Do NOT smoke after Midnight   Take these medicines the morning of surgery with A SIP OF WATER: duloxetine,carvedilol,doxycycline,levothyroxine.Ultram,sumatriptan as needed.  Bring CPAP mask and  tubing day of surgery.                              You may not have any metal on your body including hair pins, jewelry, and body piercing             Do not wear make-up, lotions, powders, perfumes/cologne, or deodorant  Do not wear nail polish including gel and S&S, artificial/acrylic nails, or any other type of covering on natural nails including finger and toenails. If you have artificial nails, gel coating, etc. that needs to be removed by a nail salon please have this removed prior to surgery or surgery may need to be canceled/ delayed if the surgeon/ anesthesia feels like they are unable to be safely monitored.   Do not shave  48 hours prior to surgery.    Do not bring valuables to the hospital. [facility] IS NOT             RESPONSIBLE   FOR VALUABLES.   Contacts, glasses, or bridgework may not be worn into surgery.   Bring small overnight bag day of surgery.   DO NOT BRING YOUR HOME MEDICATIONS TO THE HOSPITAL. PHARMACY WILL DISPENSE MEDICATIONS LISTED ON YOUR MEDICATION LIST TO YOU DURING YOUR ADMISSION IN THE HOSPITAL!    Patients discharged on the day of surgery will not be allowed to drive home.  Someone NEEDS to stay with you for the first 24 hours after anesthesia.   Special Instructions: Bring a copy of your healthcare power of attorney and living will documents         the day of surgery if you haven't scanned them before.              Please read over the following fact sheets you were given: IF YOU HAVE QUESTIONS ABOUT YOUR PRE-OP INSTRUCTIONS PLEASE CALL [phone]    [facility] - Preparing  for Surgery Before surgery, you can play an important role.  Because skin is not sterile, your skin needs to be as free of germs as possible.  You can reduce the number of germs on your skin by washing with CHG (chlorahexidine gluconate) soap before surgery.  CHG is an antiseptic cleaner which kills germs and bonds with the skin to continue killing germs even after washing. Please DO NOT use if you have an allergy to CHG or antibacterial soaps.  If your skin becomes reddened/irritated stop using the CHG and inform your nurse when you arrive at Short Stay. Do not shave (including legs and underarms) for at least 48 hours prior to the first CHG shower.  You may shave your face/neck. Please follow these instructions carefully:  1.  Shower with CHG Soap the night before surgery and the  morning of Surgery.  2.  If you choose to wash your hair, wash your hair first as usual with your  normal  shampoo.  3.  After you shampoo, rinse your hair and body thoroughly to remove the  shampoo.                           4.  Use CHG as you would any other liquid soap.  You can apply chg directly  to the skin and wash                       Gently with a  scrungie or clean washcloth.  5.  Apply the CHG Soap to your body ONLY FROM THE NECK DOWN.   Do not use on face/ open                           Wound or open sores. Avoid contact with eyes, ears mouth and genitals (private parts).                       Wash face,  Genitals (private parts) with your normal soap.             6.  Wash thoroughly, paying special attention to the area where your surgery  will be performed.  7.  Thoroughly rinse your body with warm water from the neck down.  8.  DO NOT shower/wash with your normal soap after using and rinsing off  the CHG Soap.                9.  Pat yourself dry with a clean towel.            10.  Wear clean pajamas.            11.  Place clean sheets on your bed the night of your first shower and do not  sleep with  pets. Day of Surgery : Do not apply any lotions/deodorants the morning of surgery.  Please wear clean clothes to the hospital/surgery center.  FAILURE TO FOLLOW THESE INSTRUCTIONS MAY RESULT IN THE CANCELLATION OF YOUR SURGERY PATIENT SIGNATURE_________________________________  NURSE SIGNATURE__________________________________  ________________________________________________________________________For Short Stay: COVID SWAB appointment date:  Bowel Prep reminder:   For Anesthesia: PCP -  Cardiologist -   Chest x-ray -  EKG -  Stress Test -  ECHO -  Cardiac Cath -  Pacemaker/ICD device last checked: Pacemaker orders received: Device Rep notified:  Spinal Cord Stimulator:  Sleep Study -  CPAP -   Fasting Blood Sugar -  Checks Blood Sugar _____ times a day Date and result of last Hgb A1c-  Last dose of GLP1 agonist-  GLP1 instructions:   Last dose of SGLT-2 inhibitors-  SGLT-2 instructions:   Blood Thinner Instructions: Aspirin Instructions: Last Dose:  Activity level: Can go up a flight of stairs and activities of daily living without stopping and without chest pain and/or shortness of breath   Able to exercise without chest pain and/or shortness of breath   Unable to go up a flight of stairs without chest pain and/or shortness of breath     Anesthesia review:   Patient denies shortness of breath, fever, cough and chest pain at PAT appointment   Patient verbalized understanding of instructions that were given to them at the PAT appointment. Patient was also instructed that they will need to review over the PAT instructions again at home before surgery.

## 2022-05-05 ENCOUNTER — Encounter (HOSPITAL_COMMUNITY)
Admission: RE | Admit: 2022-05-05 | Discharge: 2022-05-05 | Disposition: A | Discharge status: Home / Self Care | Payer: PRIVATE HEALTH INSURANCE | Source: Ambulatory Visit | Attending: Orthopaedic Surgery | Admitting: Orthopaedic Surgery

## 2022-05-05 ENCOUNTER — Encounter (HOSPITAL_COMMUNITY): Payer: Self-pay

## 2022-05-05 ENCOUNTER — Other Ambulatory Visit: Payer: Self-pay

## 2022-05-05 VITALS — BP 150/81 | HR 84 | Temp 98.1°F | Ht 63.0 in | Wt 150.0 lb

## 2022-05-05 DIAGNOSIS — Z955 Presence of coronary angioplasty implant and graft: Secondary | ICD-10-CM | POA: Insufficient documentation

## 2022-05-05 DIAGNOSIS — M1712 Unilateral primary osteoarthritis, left knee: Secondary | ICD-10-CM | POA: Insufficient documentation

## 2022-05-05 DIAGNOSIS — I10 Essential (primary) hypertension: Secondary | ICD-10-CM | POA: Insufficient documentation

## 2022-05-05 DIAGNOSIS — I2510 Atherosclerotic heart disease of native coronary artery without angina pectoris: Secondary | ICD-10-CM | POA: Insufficient documentation

## 2022-05-05 DIAGNOSIS — M659 Synovitis and tenosynovitis, unspecified: Secondary | ICD-10-CM | POA: Insufficient documentation

## 2022-05-05 DIAGNOSIS — M65862 Other synovitis and tenosynovitis, left lower leg: Secondary | ICD-10-CM | POA: Insufficient documentation

## 2022-05-05 DIAGNOSIS — E039 Hypothyroidism, unspecified: Secondary | ICD-10-CM | POA: Insufficient documentation

## 2022-05-05 DIAGNOSIS — Z01812 Encounter for preprocedural laboratory examination: Secondary | ICD-10-CM | POA: Insufficient documentation

## 2022-05-05 LAB — CBC
HCT: 37.6 % (ref 36.0–46.0)
Hemoglobin: 11.2 g/dL — ABNORMAL LOW (ref 12.0–15.0)
MCH: 19.1 pg — ABNORMAL LOW (ref 26.0–34.0)
MCHC: 29.8 g/dL — ABNORMAL LOW (ref 30.0–36.0)
MCV: 64.1 fL — ABNORMAL LOW (ref 80.0–100.0)
Platelets: 353 10*3/uL (ref 150–400)
RBC: 5.87 MIL/uL — ABNORMAL HIGH (ref 3.87–5.11)
RDW: 16.5 % — ABNORMAL HIGH (ref 11.5–15.5)
WBC: 10.9 10*3/uL — ABNORMAL HIGH (ref 4.0–10.5)
nRBC: 0 % (ref 0.0–0.2)

## 2022-05-05 LAB — SYNOVIAL FLUID ANALYSIS, COMPLETE
Basophils, %: 0 %
Eosinophils-Synovial: 0 % (ref 0–2)
Lymphocytes-Synovial Fld: 10 % (ref 0–74)
Monocyte/Macrophage: 0 % (ref 0–69)
Neutrophil, Synovial: 90 % — ABNORMAL HIGH (ref 0–24)
Synoviocytes, %: 0 % (ref 0–15)
WBC, Synovial: 7204 cells/uL — ABNORMAL HIGH (ref <150)

## 2022-05-05 LAB — GRAM STAIN
MICRO NUMBER:: 14887717
SPECIMEN QUALITY:: ADEQUATE

## 2022-05-05 LAB — COMPREHENSIVE METABOLIC PANEL
ALT: 20 U/L (ref 0–44)
AST: 15 U/L (ref 15–41)
Albumin: 4.3 g/dL (ref 3.5–5.0)
Alkaline Phosphatase: 101 U/L (ref 38–126)
Anion gap: 9 (ref 5–15)
BUN: 16 mg/dL (ref 8–23)
CO2: 25 mmol/L (ref 22–32)
Calcium: 9.7 mg/dL (ref 8.9–10.3)
Chloride: 106 mmol/L (ref 98–111)
Creatinine, Ser: 0.62 mg/dL (ref 0.44–1.00)
GFR, Estimated: >60 mL/min (ref >60)
Glucose, Bld: 157 mg/dL — ABNORMAL HIGH (ref 70–99)
Potassium: 4 mmol/L (ref 3.5–5.1)
Sodium: 140 mmol/L (ref 135–145)
Total Bilirubin: 0.6 mg/dL (ref 0.3–1.2)
Total Protein: 7.7 g/dL (ref 6.5–8.1)

## 2022-05-05 LAB — CBC WITH DIFFERENTIAL/PLATELET
Absolute Monocytes: 835 cells/uL (ref 200–950)
Basophils Absolute: 54 cells/uL (ref 0–200)
Basophils Relative: 0.5 %
Eosinophils Absolute: 225 cells/uL (ref 15–500)
Eosinophils Relative: 2.1 %
HCT: 37.7 % (ref 35.0–45.0)
Hemoglobin: 11.5 g/dL — ABNORMAL LOW (ref 11.7–15.5)
Lymphs Abs: 2268 cells/uL (ref 850–3900)
MCH: 19 pg — ABNORMAL LOW (ref 27.0–33.0)
MCHC: 30.5 g/dL — ABNORMAL LOW (ref 32.0–36.0)
MCV: 62.2 fL — ABNORMAL LOW (ref 80.0–100.0)
MPV: 10 fL (ref 7.5–12.5)
Monocytes Relative: 7.8 %
Neutro Abs: 7319 cells/uL (ref 1500–7800)
Neutrophils Relative %: 68.4 %
Platelets: 356 10*3/uL (ref 140–400)
RBC: 6.06 10*6/uL — ABNORMAL HIGH (ref 3.80–5.10)
RDW: 16.4 % — ABNORMAL HIGH (ref 11.0–15.0)
Total Lymphocyte: 21.2 %
WBC: 10.7 10*3/uL (ref 3.8–10.8)

## 2022-05-05 LAB — C-REACTIVE PROTEIN: CRP: 18.5 mg/L — ABNORMAL HIGH (ref <8.0)

## 2022-05-05 LAB — SEDIMENTATION RATE: Sed Rate: 31 mm/h — ABNORMAL HIGH (ref 0–30)

## 2022-05-05 NOTE — Anesthesia Preprocedure Evaluation (Addendum)
Anesthesia Evaluation  Patient identified by MRN, date of birth, ID band Patient awake    Reviewed: Allergy & Precautions, H&P , NPO status , Patient's Chart, lab work & pertinent test results, reviewed documented beta blocker date and time   History of Anesthesia Complications (+) DIFFICULT AIRWAY and history of anesthetic complications  Airway Mallampati: III  TM Distance: >3 FB Neck ROM: Full    Dental no notable dental hx. (+) Teeth Intact, Dental Advisory Given   Pulmonary neg pulmonary ROS   Pulmonary exam normal breath sounds clear to auscultation       Cardiovascular hypertension, Pt. on medications and Pt. on home beta blockers + CAD   Rhythm:Regular Rate:Normal     Neuro/Psych  Headaches  negative psych ROS   GI/Hepatic negative GI ROS, Neg liver ROS,,,  Endo/Other  Hypothyroidism    Renal/GU negative Renal ROS  negative genitourinary   Musculoskeletal  (+) Arthritis , Osteoarthritis,  Fibromyalgia -  Abdominal   Peds  Hematology  (+) Blood dyscrasia, anemia   Anesthesia Other Findings   Reproductive/Obstetrics negative OB ROS                             Anesthesia Physical Anesthesia Plan  ASA: 3  Anesthesia Plan: Spinal   Post-op Pain Management: Regional block* and Tylenol PO (pre-op)*   Induction: Intravenous  PONV Risk Score and Plan: 3 and Ondansetron, Propofol infusion and Midazolam  Airway Management Planned: Natural Airway and Simple Face Mask  Additional Equipment:   Intra-op Plan:   Post-operative Plan: Extubation in OR  Informed Consent: I have reviewed the patients History and Physical, chart, labs and discussed the procedure including the risks, benefits and alternatives for the proposed anesthesia with the patient or authorized representative who has indicated his/her understanding and acceptance.     Dental advisory given  Plan Discussed with:  CRNA  Anesthesia Plan Comments: (See PAT note 05/05/22)       Anesthesia Quick Evaluation

## 2022-05-05 NOTE — Progress Notes (Signed)
For Short Stay: COVID SWAB appointment date:  Bowel Prep reminder: N/A   For Anesthesia: PCP - Dr. Glori Bickers Cardiologist - Dr. Enrigue Catena.  Chest x-ray -  EKG - 12/22/21 Stress Test -  ECHO -  Cardiac Cath - 2017 Pacemaker/ICD device last checked: Pacemaker orders received: Device Rep notified:  Spinal Cord Stimulator: N/A  Sleep Study - N/A CPAP -   Fasting Blood Sugar - N/A Checks Blood Sugar _____ times a day Date and result of last Hgb A1c-  Last dose of GLP1 agonist-  GLP1 instructions:   Last dose of SGLT-2 inhibitors-  SGLT-2 instructions:   Blood Thinner Instructions: Aspirin Instructions: Last Dose:  Activity level: Can go up a flight of stairs and activities of daily living without stopping and without chest pain and/or shortness of breath   Able to exercise without chest pain and/or shortness of breath  Anesthesia review: Hx: CAD,HTN,difficult intubation.  Patient denies shortness of breath, fever, cough and chest pain at PAT appointment   Patient verbalized understanding of instructions that were given to them at the PAT appointment. Patient was also instructed that they will need to review over the PAT instructions again at home before surgery.

## 2022-05-05 NOTE — Progress Notes (Signed)
Anesthesia chart review   Case: [id number] Date/Time: 05/06/22 1430   Procedure: OPEN ARTHROTOMY LEFT KNEE WITH SYNOVECTOMY AND POLY-LINER EXCHANGE (Left: Knee)   Anesthesia type: Choice   Pre-op diagnosis: synovitis left total knee, questionable infection   Location: [facility] ROOM 10 / [facility]   Surgeons: Kathryne Hitch, MD       DISCUSSION: 62 year old never smoker with h/o HTN, CAD (RCA stent), hypothyroidism left knee OA scheduled for above procedure with Dr. Magnus Ivan.   Per previous anesthesia note, "In regards to DIFFICULT INTUBATION, there is a "Unrecognized Difficult Intubation" form dated 07/23/12 from [facility]now [facility] scanned under the Media Tab, Correspondence, Encounter date 12/25/12. Reasons for difficulty included for cervical extension, short thyromental space, limited mouth opening, prominent teeth (overbite), anterior larynx. Ventilation was easy. Success was achieved with the patient unconscious/paralyzed using Glidescope. An alternative airway device after induction of anesthesia recommended for future anesthetics. 04/30/12 anesthesia record also obtained from [facility] (Media Tab, Correspondence, 03/29/16) that indicated on that day, patient had grade III view with MIL 2 blade, grade I view with AirTraq (guided video intubation). 7.0 ETT placed after three attempts. She had an LMA bilateral finger fusions 12/25/12 and a spinal for 03/29/16 right TKA done at [facility]. Previous note indicated that she reported upper central/lateral incisors with caps and right upper molar with a crown. "  Echo 05/30/2014 EF 60, mild TR.  08/14/2019 MPI, normal EF, normal perfusion except for small area in in for apical segment possibly attenuation could not rule out ischemia.   Patient last seen by cardiology 03/09/2022.  Per office visit note asymptomatic at this time.  Patient is status post left total knee 12/31/2021.  No anesthesia complications  noted. VS: BP (!) 150/81   Pulse 84   Temp 36.7 C (Oral)   Ht 5\' 3"  (1.6 m)   Wt 68 kg   SpO2 99%   BMI 26.57 kg/m   PROVIDERS: Glori Bickers, MD   LABS: Labs reviewed: Acceptable for surgery. (all labs ordered are listed, but only abnormal results are displayed)  Labs Reviewed  COMPREHENSIVE METABOLIC PANEL - Abnormal; Notable for the following components:      Result Value   Glucose, Bld 157 (*)    All other components within normal limits  CBC - Abnormal; Notable for the following components:   WBC 10.9 (*)    RBC 5.87 (*)    Hemoglobin 11.2 (*)    MCV 64.1 (*)    MCH 19.1 (*)    MCHC 29.8 (*)    RDW 16.5 (*)    All other components within normal limits     IMAGES:   EKG:   CV:  Past Medical History:  Diagnosis Date   Abnormally small mouth    Complication of anesthesia    Coronary artery disease    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 x 4 yr.   Hyperthyroidism    Hypothyroidism    Osteoarthritis    S/P coronary artery stent placement 04/2011   states right coronary artery   Thalassemia minor  Past Surgical History:  Procedure Laterality Date   ABDOMINAL HERNIA REPAIR  04/2012   ABDOMINAL SURGERY  01/2011   for intestinal blockage   APPENDECTOMY  1984   CARDIAC CATHETERIZATION     CATARACT EXTRACTION W/ INTRAOCULAR LENS IMPLANT Bilateral 05/2020   CHOLECYSTECTOMY  07/2012   COLON SURGERY     COLONOSCOPY     CORONARY ANGIOPLASTY  04/2011   stent x 1   DILATION AND CURETTAGE OF UTERUS     1989   DISTAL INTERPHALANGEAL JOINT FUSION Bilateral 12/25/2012   Procedure: DISTAL INTERPHALANGEAL JOINT FUSION RIGHT AND LEFT SMALL FINGER;  Surgeon: Wyn Forster., MD;   Location: [facility];  Service: Orthopedics;  Laterality: Bilateral;  left and right small finger   ESOPHAGOGASTRODUODENOSCOPY     EXCISION MASS LOWER EXTREMETIES Left 11/15/2018   Procedure: LEFT FOOT DEEP MASS EXCISION;  Surgeon: Terance Hart, MD;  Location: [facility] OR;  Service: Orthopedics;  Laterality: Left;  SURGERY REQUEST TIME 1.5 HOURS   FINGER SURGERY     HAND SURGERY  2014   Bilateral pinky finger distal joint fusion   HAND SURGERY  2016   Right Thumb reconstruction and left middle finger distal joint fusion   HAND SURGERY     Scar tissue removal right palm   HERNIA REPAIR     JOINT REPLACEMENT     right kne total knee replacement   KNEE ARTHROPLASTY     OVARIAN CYST REMOVAL     TOTAL KNEE ARTHROPLASTY Right 03/29/2016   TOTAL KNEE ARTHROPLASTY Right 03/29/2016   Procedure: TOTAL KNEE ARTHROPLASTY;  Surgeon: Valeria Batman, MD;  Location: [facility] OR;  Service: Orthopedics;  Laterality: Right;   TOTAL KNEE ARTHROPLASTY Left 12/31/2021   Procedure: LEFT TOTAL KNEE ARTHROPLASTY;  Surgeon: Kathryne Hitch, MD;  Location: [facility];  Service: Orthopedics;  Laterality: Left;   TUBAL LIGATION Bilateral 1993   WISDOM TOOTH EXTRACTION      MEDICATIONS:  traMADol (ULTRAM) 50 MG tablet   aspirin 81 MG chewable tablet   carvedilol (COREG) 25 MG tablet   celecoxib (CELEBREX) 200 MG capsule   diclofenac (VOLTAREN) 75 MG EC tablet   doxycycline (VIBRAMYCIN) 100 MG capsule   DULoxetine (CYMBALTA) 30 MG capsule   Fe Fum-FA-B Cmp-C-Zn-Mg-Mn-Cu (HEMOCYTE PLUS) 106-1 MG CAPS   HYDROcodone-acetaminophen (NORCO/VICODIN) 5-325 MG tablet   levothyroxine (SYNTHROID) 100 MCG tablet   methocarbamol (ROBAXIN) 500 MG tablet   rosuvastatin (CRESTOR) 10 MG tablet   SUMAtriptan (IMITREX) 50 MG tablet   No current facility-administered medications for this encounter.    Jodell Cipro Ward, PA-C [facility] [phone]

## 2022-05-05 NOTE — H&P (Signed)
Shannon Jennings is an 62 y.o. female.   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  It is now been 4 months since surgery and over the last week she developed worsening pain with swelling of that left knee.  I saw her in the office this week and there was a red hue around the skin of the knee.  There was definitely a moderate effusion.  She states that she did not feel well.  Her peripheral white blood cell count was normal but her CRP was 18 and her sed rate was 31.  I aspirated fluid from the knee and it shows no organisms and has not grown anything but it does show 7200 white blood cells.  This is certainly concerning for infection.  I spoke to her about my recommendation of an open arthrotomy with sending off tissue cultures and changing out the polyliner of the knee is the hopes of eradicating any type of infection if present and treating this accordingly.  She understands this fully and does agree with proceeding with surgery as well.  She does have rheumatoid disease but has not been on any type of rheumatologic medications recently.  Past Medical History:  Diagnosis Date   Abnormally small mouth    Complication of anesthesia    Coronary artery disease    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 x 4 yr.   Hyperthyroidism    Hypothyroidism    Osteoarthritis    S/P coronary artery stent placement 04/2011   states right coronary artery   Thalassemia minor     Past Surgical History:  Procedure Laterality Date   ABDOMINAL HERNIA REPAIR  04/2012   ABDOMINAL SURGERY  01/2011   for intestinal blockage   APPENDECTOMY   1984   CARDIAC CATHETERIZATION     CATARACT EXTRACTION W/ INTRAOCULAR LENS IMPLANT Bilateral 05/2020   CHOLECYSTECTOMY  07/2012   COLON SURGERY     COLONOSCOPY     CORONARY ANGIOPLASTY  04/2011   stent x 1   DILATION AND CURETTAGE OF UTERUS     1989   DISTAL INTERPHALANGEAL JOINT FUSION Bilateral 12/25/2012   Procedure: DISTAL INTERPHALANGEAL JOINT FUSION RIGHT AND LEFT SMALL FINGER;  Surgeon: Wyn Forster., MD;  Location:  SURGERY CENTER;  Service: Orthopedics;  Laterality: Bilateral;  left and right small finger   ESOPHAGOGASTRODUODENOSCOPY     EXCISION MASS LOWER EXTREMETIES Left 11/15/2018   Procedure: LEFT FOOT DEEP MASS EXCISION;  Surgeon: Terance Hart, MD;  Location: [facility] OR;  Service: Orthopedics;  Laterality: Left;  SURGERY REQUEST TIME 1.5 HOURS   FINGER SURGERY     HAND SURGERY  2014   Bilateral pinky finger distal joint fusion   HAND SURGERY  2016   Right Thumb reconstruction and left middle finger distal joint fusion   HAND SURGERY     Scar tissue removal right palm   HERNIA REPAIR     JOINT REPLACEMENT     right kne total knee replacement  KNEE ARTHROPLASTY     OVARIAN CYST REMOVAL     TOTAL KNEE ARTHROPLASTY Right 03/29/2016   TOTAL KNEE ARTHROPLASTY Right 03/29/2016   Procedure: TOTAL KNEE ARTHROPLASTY;  Surgeon: Valeria Batman, MD;  Location: [facility] OR;  Service: Orthopedics;  Laterality: Right;   TOTAL KNEE ARTHROPLASTY Left 12/31/2021   Procedure: LEFT TOTAL KNEE ARTHROPLASTY;  Surgeon: Kathryne Hitch, MD;  Location: [facility] ORS;  Service: Orthopedics;  Laterality: Left;   TUBAL LIGATION Bilateral 1993   WISDOM TOOTH EXTRACTION      Family History  Problem Relation Age of Onset   Heart disease Mother    Arthritis Mother    Cancer Sister    Seizures Sister    Arthritis Brother    Diabetes Brother    Social History:  reports that she has never smoked. She has never used smokeless tobacco. She reports that she does not drink  alcohol and does not use drugs.  Allergies:  Allergies  Allergen Reactions   Tizanidine     Chest pain    Toradol [Ketorolac Tromethamine]     Chest pain    Trokendi Xr [Topiramate Er]     Double vision    Dilaudid [Hydromorphone Hcl] Other (See Comments)    manic   Penicillins Rash    Has patient had a PCN reaction causing immediate rash, facial/tongue/throat swelling, SOB or lightheadedness with hypotension: No Has patient had a PCN reaction causing severe rash involving mucus membranes or skin necrosis: unknown Has patient had a PCN reaction that required hospitalization No Has patient had a PCN reaction occurring within the last 10 years: No If all of the above answers are "NO", then may proceed with Cephalosporin use.    Sulfa Antibiotics Rash    No medications prior to admission.    Results for orders placed or performed during the hospital encounter of 05/05/22 (from the past 48 hour(s))  Comprehensive metabolic panel per protocol     Status: Abnormal   Collection Time: 05/05/22  2:07 PM  Result Value Ref Range   Sodium 140 135 - 145 mmol/L   Potassium 4.0 3.5 - 5.1 mmol/L   Chloride 106 98 - 111 mmol/L   CO2 25 22 - 32 mmol/L   Glucose, Bld 157 (H) 70 - 99 mg/dL    Comment: Glucose reference range applies only to samples taken after fasting for at least 8 hours.   BUN 16 8 - 23 mg/dL   Creatinine, Ser 1.61 0.44 - 1.00 mg/dL   Calcium 9.7 8.9 - 09.6 mg/dL   Total Protein 7.7 6.5 - 8.1 g/dL   Albumin 4.3 3.5 - 5.0 g/dL   AST 15 15 - 41 U/L   ALT 20 0 - 44 U/L   Alkaline Phosphatase 101 38 - 126 U/L   Total Bilirubin 0.6 0.3 - 1.2 mg/dL   GFR, Estimated >04 >54 mL/min    Comment: (NOTE) Calculated using the CKD-EPI Creatinine Equation (2021)    Anion gap 9 5 - 15    Comment: Performed at [facility], [address] [place], [place] [address]  CBC per protocol     Status: Abnormal   Collection Time: 05/05/22  2:07 PM  Result Value Ref  Range   WBC 10.9 (H) 4.0 - 10.5 K/uL   RBC 5.87 (H) 3.87 - 5.11 MIL/uL   Hemoglobin 11.2 (L) 12.0 - 15.0 g/dL   HCT 91.4 78.2 - 95.6 %   MCV 64.1 (L) 80.0 -  100.0 fL   MCH 19.1 (L) 26.0 - 34.0 pg   MCHC 29.8 (L) 30.0 - 36.0 g/dL   RDW 56.4 (H) 33.2 - 95.1 %   Platelets 353 150 - 400 K/uL    Comment: SPECIMEN CHECKED FOR CLOTS REPEATED TO VERIFY    nRBC 0.0 0.0 - 0.2 %    Comment: Performed at [facility], [address] [place], [place] [address]   No results found.  Review of Systems  There were no vitals taken for this visit. Physical Exam Vitals reviewed.  Constitutional:      Appearance: Normal appearance. She is normal weight.  HENT:     Head: Normocephalic and atraumatic.  Eyes:     Extraocular Movements: Extraocular movements intact.     Pupils: Pupils are equal, round, and reactive to light.  Cardiovascular:     Rate and Rhythm: Normal rate and regular rhythm.  Pulmonary:     Effort: Pulmonary effort is normal.     Breath sounds: Normal breath sounds.  Abdominal:     Palpations: Abdomen is soft.  Musculoskeletal:     Cervical back: Normal range of motion and neck supple.     Left knee: Effusion and erythema present. Tenderness present.  Neurological:     Mental Status: She is alert and oriented to person, place, and time.  Psychiatric:        Behavior: Behavior normal.      Assessment/Plan Left total knee arthroplasty synovitis with questionable infection  Our plan is to proceed to surgery for a left knee open arthrotomy with synovectomy and polyliner exchange.  The hope is to obtain soft tissue samples from the knee for Gram stain and culture to determine whether or not an infection is present and then how best to treat this.  The risks and benefits of surgery been explained in detail and she does agree to proceed with the surgery given her current clinical findings and presentation.  We did discuss the risk of acute blood loss anemia,  finding deep infection and the potential need to eventually remove the implants depending on our intraoperative findings and treatment course.  Kathryne Hitch, MD 05/05/2022, 6:48 PM

## 2022-05-06 ENCOUNTER — Ambulatory Visit (HOSPITAL_COMMUNITY): Payer: PRIVATE HEALTH INSURANCE | Admitting: Anesthesiology

## 2022-05-06 ENCOUNTER — Inpatient Hospital Stay (HOSPITAL_COMMUNITY)
Admission: RE | Admit: 2022-05-06 | Discharge: 2022-05-09 | DRG: 489 | Disposition: A | Discharge status: Home / Self Care | Payer: PRIVATE HEALTH INSURANCE | Attending: Orthopaedic Surgery | Admitting: Orthopaedic Surgery

## 2022-05-06 ENCOUNTER — Encounter (HOSPITAL_COMMUNITY): Payer: Self-pay | Admitting: Orthopaedic Surgery

## 2022-05-06 ENCOUNTER — Other Ambulatory Visit: Payer: Self-pay

## 2022-05-06 ENCOUNTER — Encounter (HOSPITAL_COMMUNITY)
Admission: RE | Disposition: A | Discharge status: Home / Self Care | Payer: Self-pay | Source: Home / Self Care | Attending: Orthopaedic Surgery

## 2022-05-06 ENCOUNTER — Ambulatory Visit (HOSPITAL_COMMUNITY): Payer: PRIVATE HEALTH INSURANCE | Admitting: Physician Assistant

## 2022-05-06 DIAGNOSIS — M65162 Other infective (teno)synovitis, left knee: Secondary | ICD-10-CM | POA: Diagnosis present

## 2022-05-06 DIAGNOSIS — Z9049 Acquired absence of other specified parts of digestive tract: Secondary | ICD-10-CM

## 2022-05-06 DIAGNOSIS — E7800 Pure hypercholesterolemia, unspecified: Secondary | ICD-10-CM | POA: Diagnosis present

## 2022-05-06 DIAGNOSIS — M797 Fibromyalgia: Secondary | ICD-10-CM | POA: Diagnosis present

## 2022-05-06 DIAGNOSIS — Z96651 Presence of right artificial knee joint: Secondary | ICD-10-CM | POA: Diagnosis present

## 2022-05-06 DIAGNOSIS — I2510 Atherosclerotic heart disease of native coronary artery without angina pectoris: Secondary | ICD-10-CM | POA: Diagnosis present

## 2022-05-06 DIAGNOSIS — M069 Rheumatoid arthritis, unspecified: Secondary | ICD-10-CM | POA: Diagnosis present

## 2022-05-06 DIAGNOSIS — Z882 Allergy status to sulfonamides status: Secondary | ICD-10-CM

## 2022-05-06 DIAGNOSIS — Z87442 Personal history of urinary calculi: Secondary | ICD-10-CM | POA: Diagnosis not present

## 2022-05-06 DIAGNOSIS — E039 Hypothyroidism, unspecified: Secondary | ICD-10-CM | POA: Diagnosis present

## 2022-05-06 DIAGNOSIS — Z833 Family history of diabetes mellitus: Secondary | ICD-10-CM

## 2022-05-06 DIAGNOSIS — Z888 Allergy status to other drugs, medicaments and biological substances status: Secondary | ICD-10-CM | POA: Diagnosis not present

## 2022-05-06 DIAGNOSIS — Z8261 Family history of arthritis: Secondary | ICD-10-CM

## 2022-05-06 DIAGNOSIS — Z885 Allergy status to narcotic agent status: Secondary | ICD-10-CM

## 2022-05-06 DIAGNOSIS — D638 Anemia in other chronic diseases classified elsewhere: Secondary | ICD-10-CM

## 2022-05-06 DIAGNOSIS — T8454XA Infection and inflammatory reaction due to internal left knee prosthesis, initial encounter: Secondary | ICD-10-CM

## 2022-05-06 DIAGNOSIS — Z880 Allergy status to penicillin: Secondary | ICD-10-CM | POA: Diagnosis not present

## 2022-05-06 DIAGNOSIS — D563 Thalassemia minor: Secondary | ICD-10-CM | POA: Diagnosis present

## 2022-05-06 DIAGNOSIS — Z96652 Presence of left artificial knee joint: Secondary | ICD-10-CM

## 2022-05-06 DIAGNOSIS — Z8249 Family history of ischemic heart disease and other diseases of the circulatory system: Secondary | ICD-10-CM

## 2022-05-06 DIAGNOSIS — Z955 Presence of coronary angioplasty implant and graft: Secondary | ICD-10-CM | POA: Diagnosis not present

## 2022-05-06 DIAGNOSIS — T8489XA Other specified complication of internal orthopedic prosthetic devices, implants and grafts, initial encounter: Principal | ICD-10-CM | POA: Diagnosis present

## 2022-05-06 DIAGNOSIS — I10 Essential (primary) hypertension: Secondary | ICD-10-CM | POA: Diagnosis present

## 2022-05-06 DIAGNOSIS — M659 Synovitis and tenosynovitis, unspecified: Secondary | ICD-10-CM | POA: Insufficient documentation

## 2022-05-06 DIAGNOSIS — M25562 Pain in left knee: Secondary | ICD-10-CM | POA: Diagnosis present

## 2022-05-06 HISTORY — PX: SYNOVECTOMY WITH POLY EXCHANGE: SHX6746

## 2022-05-06 LAB — AEROBIC/ANAEROBIC CULTURE W GRAM STAIN (SURGICAL/DEEP WOUND)

## 2022-05-06 SURGERY — SYNOVECTOMY WITH POLY EXCHANGE
Anesthesia: Spinal | Site: Knee | Laterality: Left

## 2022-05-06 MED ORDER — SODIUM CHLORIDE 0.9 % IV SOLN: INTRAVENOUS | Status: DC

## 2022-05-06 MED ORDER — EPINEPHRINE PF 1 MG/ML IJ SOLN
INTRAMUSCULAR | Status: AC
  Filled 2022-05-06: qty 1

## 2022-05-06 MED ORDER — ACETAMINOPHEN 325 MG PO TABS
325.0000 mg | ORAL_TABLET | Freq: Four times a day (QID) | ORAL | Status: DC | PRN
  Administered 2022-05-07 – 2022-05-09 (×6): 650 mg via ORAL
  Filled 2022-05-06 (×6): qty 2

## 2022-05-06 MED ORDER — DOCUSATE SODIUM 100 MG PO CAPS
100.0000 mg | ORAL_CAPSULE | Freq: Two times a day (BID) | ORAL | Status: DC
  Administered 2022-05-06 – 2022-05-09 (×6): 100 mg via ORAL
  Filled 2022-05-06 (×6): qty 1

## 2022-05-06 MED ORDER — PHENYLEPHRINE HCL-NACL 20-0.9 MG/250ML-% IV SOLN
INTRAVENOUS | Status: DC | PRN
  Administered 2022-05-06: 15 ug/min via INTRAVENOUS

## 2022-05-06 MED ORDER — METOCLOPRAMIDE HCL 5 MG/ML IJ SOLN: 5.0000 mg | Freq: Three times a day (TID) | INTRAMUSCULAR | Status: DC | PRN

## 2022-05-06 MED ORDER — ONDANSETRON HCL 4 MG PO TABS: 4.0000 mg | ORAL_TABLET | Freq: Four times a day (QID) | ORAL | Status: DC | PRN

## 2022-05-06 MED ORDER — METHOCARBAMOL 500 MG PO TABS
500.0000 mg | ORAL_TABLET | Freq: Four times a day (QID) | ORAL | Status: DC | PRN
  Administered 2022-05-06 – 2022-05-07 (×3): 500 mg via ORAL
  Filled 2022-05-06 (×3): qty 1

## 2022-05-06 MED ORDER — PHENOL 1.4 % MT LIQD: 1.0000 | OROMUCOSAL | Status: DC | PRN

## 2022-05-06 MED ORDER — CHLORHEXIDINE GLUCONATE 0.12 % MT SOLN
15.0000 mL | Freq: Once | OROMUCOSAL | Status: AC
  Administered 2022-05-06: 15 mL via OROMUCOSAL

## 2022-05-06 MED ORDER — STERILE WATER FOR IRRIGATION IR SOLN
Status: DC | PRN
  Administered 2022-05-06: 2000 mL

## 2022-05-06 MED ORDER — CEFAZOLIN SODIUM-DEXTROSE 2-4 GM/100ML-% IV SOLN
2.0000 g | Freq: Three times a day (TID) | INTRAVENOUS | Status: DC
  Administered 2022-05-06 – 2022-05-09 (×8): 2 g via INTRAVENOUS
  Filled 2022-05-06 (×8): qty 100

## 2022-05-06 MED ORDER — CEFAZOLIN SODIUM-DEXTROSE 2-4 GM/100ML-% IV SOLN
2.0000 g | Freq: Once | INTRAVENOUS | Status: AC
  Administered 2022-05-06: 2 g via INTRAVENOUS

## 2022-05-06 MED ORDER — LACTATED RINGERS IV SOLN: INTRAVENOUS | Status: DC

## 2022-05-06 MED ORDER — ACETAMINOPHEN 500 MG PO TABS
1000.0000 mg | ORAL_TABLET | Freq: Once | ORAL | Status: AC
  Administered 2022-05-06: 1000 mg via ORAL
  Filled 2022-05-06: qty 2

## 2022-05-06 MED ORDER — ADULT MULTIVITAMIN W/MINERALS CH
1.0000 | ORAL_TABLET | Freq: Every evening | ORAL | Status: DC
  Administered 2022-05-06 – 2022-05-08 (×3): 1 via ORAL
  Filled 2022-05-06 (×3): qty 1

## 2022-05-06 MED ORDER — VANCOMYCIN HCL 1000 MG IV SOLR
INTRAVENOUS | Status: DC | PRN
  Administered 2022-05-06: 1000 mg via INTRAVENOUS

## 2022-05-06 MED ORDER — METHOCARBAMOL 500 MG IVPB - SIMPLE MED: 500.0000 mg | Freq: Four times a day (QID) | INTRAVENOUS | Status: DC | PRN

## 2022-05-06 MED ORDER — BUPIVACAINE-EPINEPHRINE (PF) 0.5% -1:200000 IJ SOLN
INTRAMUSCULAR | Status: DC | PRN
  Administered 2022-05-06: 20 mL via PERINEURAL

## 2022-05-06 MED ORDER — ONDANSETRON HCL 4 MG/2ML IJ SOLN
INTRAMUSCULAR | Status: DC | PRN
  Administered 2022-05-06: 4 mg via INTRAVENOUS

## 2022-05-06 MED ORDER — MENTHOL 3 MG MT LOZG: 1.0000 | LOZENGE | OROMUCOSAL | Status: DC | PRN

## 2022-05-06 MED ORDER — ONDANSETRON HCL 4 MG/2ML IJ SOLN: 4.0000 mg | Freq: Four times a day (QID) | INTRAMUSCULAR | Status: DC | PRN

## 2022-05-06 MED ORDER — DIPHENHYDRAMINE HCL 12.5 MG/5ML PO ELIX
12.5000 mg | ORAL_SOLUTION | ORAL | Status: DC | PRN
  Administered 2022-05-06 – 2022-05-07 (×2): 25 mg via ORAL
  Filled 2022-05-06 (×2): qty 10

## 2022-05-06 MED ORDER — VANCOMYCIN HCL IN DEXTROSE 1-5 GM/200ML-% IV SOLN
1000.0000 mg | Freq: Once | INTRAVENOUS | Status: DC
  Filled 2022-05-06: qty 200

## 2022-05-06 MED ORDER — PROPOFOL 500 MG/50ML IV EMUL
INTRAVENOUS | Status: DC | PRN
  Administered 2022-05-06: 75 ug/kg/min via INTRAVENOUS
  Administered 2022-05-06: 20 mg via INTRAVENOUS

## 2022-05-06 MED ORDER — ALUM & MAG HYDROXIDE-SIMETH 200-200-20 MG/5ML PO SUSP: 30.0000 mL | ORAL | Status: DC | PRN

## 2022-05-06 MED ORDER — SODIUM CHLORIDE 0.9 % IR SOLN
Status: DC | PRN
  Administered 2022-05-06: 3000 mL

## 2022-05-06 MED ORDER — 0.9 % SODIUM CHLORIDE (POUR BTL) OPTIME
TOPICAL | Status: DC | PRN
  Administered 2022-05-06: 1000 mL

## 2022-05-06 MED ORDER — TRANEXAMIC ACID-NACL 1000-0.7 MG/100ML-% IV SOLN
INTRAVENOUS | Status: DC | PRN
  Administered 2022-05-06: 1000 mg via INTRAVENOUS

## 2022-05-06 MED ORDER — TRANEXAMIC ACID-NACL 1000-0.7 MG/100ML-% IV SOLN
1000.0000 mg | INTRAVENOUS | Status: DC
  Filled 2022-05-06: qty 100

## 2022-05-06 MED ORDER — FENTANYL CITRATE PF 50 MCG/ML IJ SOSY: 25.0000 ug | PREFILLED_SYRINGE | INTRAMUSCULAR | Status: DC | PRN

## 2022-05-06 MED ORDER — OXYCODONE HCL 5 MG PO TABS
10.0000 mg | ORAL_TABLET | ORAL | Status: DC | PRN
  Administered 2022-05-06 – 2022-05-09 (×12): 10 mg via ORAL
  Filled 2022-05-06 (×12): qty 2

## 2022-05-06 MED ORDER — ONDANSETRON HCL 4 MG/2ML IJ SOLN
INTRAMUSCULAR | Status: AC
  Administered 2022-05-06: 4 mg via INTRAVENOUS
  Filled 2022-05-06: qty 2

## 2022-05-06 MED ORDER — VANCOMYCIN HCL 1000 MG IV SOLR
INTRAVENOUS | Status: DC | PRN
  Administered 2022-05-06: 1000 mg via TOPICAL

## 2022-05-06 MED ORDER — MIDAZOLAM HCL 2 MG/2ML IJ SOLN
1.0000 mg | INTRAMUSCULAR | Status: DC
  Administered 2022-05-06: 1 mg via INTRAVENOUS
  Filled 2022-05-06: qty 2

## 2022-05-06 MED ORDER — DULOXETINE HCL 30 MG PO CPEP
30.0000 mg | ORAL_CAPSULE | Freq: Every day | ORAL | Status: DC
  Administered 2022-05-06 – 2022-05-09 (×4): 30 mg via ORAL
  Filled 2022-05-06 (×4): qty 1

## 2022-05-06 MED ORDER — BUPIVACAINE-EPINEPHRINE 0.25% -1:200000 IJ SOLN
INTRAMUSCULAR | Status: DC | PRN
  Administered 2022-05-06: 30 mL

## 2022-05-06 MED ORDER — ROSUVASTATIN CALCIUM 10 MG PO TABS
10.0000 mg | ORAL_TABLET | Freq: Every day | ORAL | Status: DC
  Administered 2022-05-07 – 2022-05-09 (×3): 10 mg via ORAL
  Filled 2022-05-06 (×3): qty 1

## 2022-05-06 MED ORDER — FENTANYL CITRATE PF 50 MCG/ML IJ SOSY
50.0000 ug | PREFILLED_SYRINGE | INTRAMUSCULAR | Status: DC
  Administered 2022-05-06: 50 ug via INTRAVENOUS
  Filled 2022-05-06: qty 2

## 2022-05-06 MED ORDER — BUPIVACAINE HCL 0.25 % IJ SOLN
INTRAMUSCULAR | Status: AC
  Filled 2022-05-06: qty 1

## 2022-05-06 MED ORDER — LEVOTHYROXINE SODIUM 100 MCG PO TABS
100.0000 ug | ORAL_TABLET | Freq: Every day | ORAL | Status: DC
  Administered 2022-05-07 – 2022-05-09 (×3): 100 ug via ORAL
  Filled 2022-05-06 (×3): qty 1

## 2022-05-06 MED ORDER — METOCLOPRAMIDE HCL 5 MG PO TABS: 5.0000 mg | ORAL_TABLET | Freq: Three times a day (TID) | ORAL | Status: DC | PRN

## 2022-05-06 MED ORDER — VANCOMYCIN HCL 1000 MG IV SOLR
INTRAVENOUS | Status: AC
  Filled 2022-05-06: qty 20

## 2022-05-06 MED ORDER — HYDROCODONE-ACETAMINOPHEN 5-325 MG PO TABS: 1.0000 | ORAL_TABLET | ORAL | Status: DC | PRN

## 2022-05-06 MED ORDER — ORAL CARE MOUTH RINSE: 15.0000 mL | Freq: Once | OROMUCOSAL | Status: AC

## 2022-05-06 MED ORDER — ASPIRIN 81 MG PO CHEW
81.0000 mg | CHEWABLE_TABLET | Freq: Two times a day (BID) | ORAL | Status: DC
  Administered 2022-05-06 – 2022-05-09 (×6): 81 mg via ORAL
  Filled 2022-05-06 (×6): qty 1

## 2022-05-06 MED ORDER — PANTOPRAZOLE SODIUM 40 MG PO TBEC
40.0000 mg | DELAYED_RELEASE_TABLET | Freq: Every day | ORAL | Status: DC
  Filled 2022-05-06: qty 1

## 2022-05-06 MED ORDER — DEXAMETHASONE SODIUM PHOSPHATE 4 MG/ML IJ SOLN
INTRAMUSCULAR | Status: DC | PRN
  Administered 2022-05-06: 8 mg via INTRAVENOUS

## 2022-05-06 MED ORDER — MORPHINE SULFATE (PF) 2 MG/ML IV SOLN
2.0000 mg | INTRAVENOUS | Status: DC | PRN
  Administered 2022-05-06: 2 mg via INTRAVENOUS
  Administered 2022-05-06 – 2022-05-09 (×10): 4 mg via INTRAVENOUS
  Filled 2022-05-06: qty 2
  Filled 2022-05-06: qty 1
  Filled 2022-05-06 (×9): qty 2

## 2022-05-06 MED ORDER — CEFAZOLIN SODIUM 1 G IJ SOLR
INTRAMUSCULAR | Status: AC
  Filled 2022-05-06: qty 20

## 2022-05-06 MED ORDER — BUPIVACAINE IN DEXTROSE 0.75-8.25 % IT SOLN
INTRATHECAL | Status: DC | PRN
  Administered 2022-05-06: 1.6 mL via INTRATHECAL

## 2022-05-06 MED ORDER — LACTATED RINGERS IV SOLN: INTRAVENOUS | Status: DC | PRN

## 2022-05-06 MED ORDER — CARVEDILOL 25 MG PO TABS
25.0000 mg | ORAL_TABLET | Freq: Two times a day (BID) | ORAL | Status: DC
  Administered 2022-05-06 – 2022-05-09 (×6): 25 mg via ORAL
  Filled 2022-05-06 (×6): qty 1

## 2022-05-06 MED ORDER — HYDROCODONE-ACETAMINOPHEN 7.5-325 MG PO TABS
1.0000 | ORAL_TABLET | ORAL | Status: DC | PRN
  Administered 2022-05-06: 1 via ORAL
  Filled 2022-05-06: qty 1

## 2022-05-06 SURGICAL SUPPLY — 48 items
BAG COUNTER SPONGE SURGICOUNT (BAG) IMPLANT
BAG SPEC THK2 15X12 ZIP CLS (MISCELLANEOUS) ×1
BAG SPNG CNTER NS LX DISP (BAG) ×1
BAG ZIPLOCK 12X15 (MISCELLANEOUS) ×1 IMPLANT
BNDG CMPR 5X62 HK CLSR LF (GAUZE/BANDAGES/DRESSINGS) ×2
BNDG ELASTIC 6INX 5YD STR LF (GAUZE/BANDAGES/DRESSINGS) ×1 IMPLANT
COOLER ICEMAN CLASSIC (MISCELLANEOUS) ×1 IMPLANT
COVER SURGICAL LIGHT HANDLE (MISCELLANEOUS) ×1 IMPLANT
CUFF TOURN SGL QUICK 34 (TOURNIQUET CUFF) ×1
CUFF TRNQT CYL 34X4.125X (TOURNIQUET CUFF) ×2 IMPLANT
DRAPE INCISE IOBAN 66X45 STRL (DRAPES) ×2 IMPLANT
DRAPE U-SHAPE 47X51 STRL (DRAPES) ×1 IMPLANT
DRSG AQUACEL AG ADV 3.5X10 (GAUZE/BANDAGES/DRESSINGS) ×1 IMPLANT
DURAPREP 26ML APPLICATOR (WOUND CARE) ×1 IMPLANT
ELECT BLADE TIP CTD 4 INCH (ELECTRODE) IMPLANT
ELECT REM PT RETURN 15FT ADLT (MISCELLANEOUS) ×1 IMPLANT
GAUZE PAD ABD 8X10 STRL (GAUZE/BANDAGES/DRESSINGS) IMPLANT
GAUZE SPONGE 4X4 12PLY STRL (GAUZE/BANDAGES/DRESSINGS) IMPLANT
GAUZE XEROFORM 1X8 LF (GAUZE/BANDAGES/DRESSINGS) IMPLANT
GLOVE BIO SURGEON STRL SZ7.5 (GLOVE) ×2 IMPLANT
GLOVE BIOGEL PI IND STRL 8 (GLOVE) ×2 IMPLANT
GLOVE BIOGEL PI IND STRL 8.5 (GLOVE) ×2 IMPLANT
GLOVE SURG NEOPR MICRO LF SZ9 (GLOVE) ×1 IMPLANT
GLOVE SURG ORTHO 8.0 STRL STRW (GLOVE) ×3 IMPLANT
GOWN STRL REUS W/ TWL XL LVL3 (GOWN DISPOSABLE) ×2 IMPLANT
GOWN STRL REUS W/TWL XL LVL3 (GOWN DISPOSABLE) ×2
HANDPIECE INTERPULSE COAX TIP (DISPOSABLE) ×1
INSERT TIB ASF 14 6-7/CD LT (Insert) IMPLANT
KIT TURNOVER KIT A (KITS) IMPLANT
LINER TIB ASF PS CD/6-7 16 LT (Liner) IMPLANT
MANIFOLD NEPTUNE II (INSTRUMENTS) ×1 IMPLANT
NS IRRIG 1000ML POUR BTL (IV SOLUTION) ×1 IMPLANT
PAD COLD SHLDR WRAP-ON (PAD) ×1 IMPLANT
PADDING CAST COTTON 6X4 STRL (CAST SUPPLIES) IMPLANT
PROTECTOR NERVE ULNAR (MISCELLANEOUS) ×1 IMPLANT
SET HNDPC FAN SPRY TIP SCT (DISPOSABLE) ×1 IMPLANT
SPIKE FLUID TRANSFER (MISCELLANEOUS) ×2 IMPLANT
STRIP CLOSURE SKIN 1/2X4 (GAUZE/BANDAGES/DRESSINGS) ×2 IMPLANT
SUT MNCRL AB 4-0 PS2 18 (SUTURE) IMPLANT
SUT VIC AB 0 CT1 27 (SUTURE) ×2
SUT VIC AB 0 CT1 27XBRD ANBCTR (SUTURE) IMPLANT
SUT VIC AB 0 CT1 27XBRD ANTBC (SUTURE) ×1 IMPLANT
SUT VIC AB 1 CT1 36 (SUTURE) ×2 IMPLANT
SUT VIC AB 2-0 CT1 27 (SUTURE) ×2
SUT VIC AB 2-0 CT1 TAPERPNT 27 (SUTURE) ×2 IMPLANT
SWAB COLLECTION DEVICE MRSA (MISCELLANEOUS) IMPLANT
SWAB CULTURE ESWAB REG 1ML (MISCELLANEOUS) IMPLANT
TRAY FOLEY MTR SLVR 16FR STAT (SET/KITS/TRAYS/PACK) ×1 IMPLANT

## 2022-05-06 NOTE — Anesthesia Procedure Notes (Signed)
Anesthesia Regional Block: Adductor canal block   Pre-Anesthetic Checklist: , timeout performed,  Correct Patient, Correct Site, Correct Laterality,  Correct Procedure, Correct Position, site marked,  Risks and benefits discussed,  Pre-op evaluation,  At surgeon's request and post-op pain management  Laterality: Left  Prep: Maximum Sterile Barrier Precautions used, chloraprep       Needles:  Injection technique: Single-shot  Needle Type: Echogenic Stimulator Needle     Needle Length: 9cm  Needle Gauge: 21     Additional Needles:   Procedures:,,,, ultrasound used (permanent image in chart),,    Narrative:  Start time: 05/06/2022 1:19 PM End time: 05/06/2022 1:29 PM Injection made incrementally with aspirations every 5 mL.  Performed by: Personally  Anesthesiologist: Gaynelle Adu, MD

## 2022-05-06 NOTE — Anesthesia Postprocedure Evaluation (Signed)
Anesthesia Post Note  Patient: MADDELINE TAVERAS  Procedure(s) Performed: OPEN ARTHROTOMY LEFT KNEE WITH SYNOVECTOMY AND POLY-LINER EXCHANGE (Left: Knee)     Patient location during evaluation: PACU Anesthesia Type: Spinal and Regional Level of consciousness: oriented and awake and alert Pain management: pain level controlled Vital Signs Assessment: post-procedure vital signs reviewed and stable Respiratory status: spontaneous breathing, respiratory function stable and patient connected to nasal cannula oxygen Cardiovascular status: blood pressure returned to baseline and stable Postop Assessment: no headache, no backache, no apparent nausea or vomiting, spinal receding and patient able to bend at knees Anesthetic complications: no  No notable events documented.  Last Vitals:  Vitals:   05/06/22 1645 05/06/22 1700  BP: 129/68 120/68  Pulse: 69 71  Resp: 12 13  Temp:    SpO2: 100% 100%    Last Pain:  Vitals:   05/06/22 1645  TempSrc:   PainSc: 0-No pain                 Malosi Hemstreet,W. EDMOND

## 2022-05-06 NOTE — Interval H&P Note (Signed)
History and Physical Interval Note: The patient presents today for an open synovectomy and polyliner exchange of a previous left total knee arthroplasty that was done only 4 months ago.  There is concerned about the possibility of infection.  She understands fully why we are proceeding with the surgery and our recommendations about surgery today.  There is been no acute change in her medical status.  Informed consent has been obtained and the left operative knee has been marked.  05/06/2022 12:55 PM  Shannon Jennings  has presented today for surgery, with the diagnosis of synovitis left total knee, questionable infection.  The various methods of treatment have been discussed with the patient and family. After consideration of risks, benefits and other options for treatment, the patient has consented to  Procedure(s): OPEN ARTHROTOMY LEFT KNEE WITH SYNOVECTOMY AND POLY-LINER EXCHANGE (Left) as a surgical intervention.  The patient's history has been reviewed, patient examined, no change in status, stable for surgery.  I have reviewed the patient's chart and labs.  Questions were answered to the patient's satisfaction.     Kathryne Hitch

## 2022-05-06 NOTE — Anesthesia Procedure Notes (Signed)
Spinal  Patient location during procedure: OR Start time: 05/06/2022 2:14 PM End time: 05/06/2022 2:17 PM Reason for block: surgical anesthesia Staffing Performed: anesthesiologist  Anesthesiologist: Gaynelle Adu, MD Performed by: Gaynelle Adu, MD Authorized by: Gaynelle Adu, MD   Preanesthetic Checklist Completed: patient identified, IV checked, risks and benefits discussed, surgical consent, monitors and equipment checked, pre-op evaluation and timeout performed Spinal Block Patient position: sitting Prep: DuraPrep Patient monitoring: cardiac monitor, continuous pulse ox and blood pressure Approach: midline Location: L3-4 Injection technique: single-shot Needle Needle type: Pencan  Needle gauge: 24 G Needle length: 9 cm Assessment Sensory level: T8 Events: CSF return and second provider Additional Notes Functioning IV was confirmed and monitors were applied. Sterile prep and drape, including hand hygiene and sterile gloves were used. The patient was positioned and the spine was prepped. The skin was anesthetized with lidocaine.  Free flow of clear CSF was obtained prior to injecting local anesthetic into the CSF.  The spinal needle aspirated freely following injection.  The needle was carefully withdrawn.  The patient tolerated the procedure well.

## 2022-05-06 NOTE — Op Note (Signed)
Operative Note  Date of operation: 05/06/2022 Preoperative diagnosis: Left knee swelling with synovitis and questionable infection status post total knee arthroplasty Postoperative diagnosis: Same  Procedure: 1) Open arthrotomy of left knee with extensive synovectomy         2) polyliner exchange left total knee arthroplasty  Findings: Significant synovitis and serosanguineous effusion concerning for infection, Gram stain and cultures of fluid and soft tissue pending  Implants: Biomet/Zimmer persona medial congruent polyethylene liner 16 mm thickness for a size D left tibia  Surgeon: Vanita Panda. Magnus Ivan, MD Assistant: Rexene Edison, PA-C  Anesthesia: #1 left lower extremity block, #2 spinal, #3 local Tourniquet time: Less than 1 hour EBL: 100 cc Antibiotics: 2 g IV Ancef and 1 g IV vancomycin Complications none  Indications: The patient is a 62 year old female well-known to me.  She underwent a primary left total knee arthroplasty on 12/31/2021 that was performed without any complications.  She had done well postoperatively until recently.  She started developing swelling and some redness of that left knee in the last week or so.  She has a right total knee arthroplasty done remotely and that is always done well and still has no problems.  She is a rheumatoid patient but has been off her rheumatologic medications.  She works as a Engineer, civil (consulting).  She had done well postoperatively in terms of range of motion and stability but something certainly is changed with no known injury.  We did aspirate fluid from the knee in our office several days ago.  She had not been on antibiotics.  The cell count in the knee showed 7200 white blood cells.  The Gram stain showed no organisms.  Her CRP is 18 and her ESR is 31.  Her peripheral white blood cell count is 10.9.  At this point I recommended an open arthrotomy with synovectomy, polyliner exchange and a thorough washout of the knee in order to hopefully salvage  this total joint.  She fully understands and is aware of all recommendations for surgery and does wish to proceed.  Procedure description: After informed consent was obtained and the appropriate left knee was marked, a left lower extremity block was obtained in the holding room and the patient was brought to the operating room and set up on the operative table where spinal anesthesia was obtained.  She was then laid in supine position on the operating table and a Foley catheter was placed.  A nonsterile tourniquet is placed around her upper left thigh.  Her left thigh, knee, leg, and ankle were prepped and draped in DuraPrep and sterile drapes including a sterile stockinette.  A timeout was called and she identified as correct patient the correct left knee.  An Esmarch was then used to wrap out the leg and the tourniquet was plated to 300 mm of pressure.  With the knee extended we then made a direct midline incision over the patella through her previous incision and carried this proximally distally.  We dissected down the joint and carried out a medial parapatellar arthrotomy finding a moderate joint effusion.  The joint fluid was sent for Gram stain and culture.  We then opened the knee fully and performed a synovectomy of the medial and lateral gutters and the suprapatellar area.  We remove the previous polyliner and performed a synovectomy posteriorly.  We found suspicious tissue in terms of the potential for infection and this was also sent for tissue sample for Gram stain and culture.  We did spend some  time moving around the knee completely in terms of a full and thorough synovectomy.  We then irrigated 3 L normal saline solution through the knee.  This was done with pulsatile lavage.  The femoral and tibial components did not appear loose.  We then decided to place a new polyethylene liner with the same 14 mm thickness that she had before.  We placed this polyliner but I felt the knee had more play in it  likely from the synovectomy.  We ended up removing that 14 mm thickness ladder and went up to a 16 mm thickness polythene liner.  We then irrigated the knee with Prontosan antibiotic irrigation solution.  Once we cleaned the knee out and dried the knee from that we placed vancomycin powder within the arthrotomy and closed the arthrotomy with interrupted #1 Vicryl suture.  0 Vicryl was used to close deep tissue and 2-0 Vicryl was used to close the subcutaneous tissue.  The skin was closed with staples.  Well-padded sterile dressings applied.  The patient was taken recovery in stable addition with all final counts being correct and no complications noted.  [employer] PA-C did assist during the entire case and beginning to end and his assistance was crucial and medically necessary for soft tissue management and retraction, helping guide implant placement and a layered closure of the wound.

## 2022-05-06 NOTE — Transfer of Care (Signed)
Immediate Anesthesia Transfer of Care Note  Patient: Shannon Jennings  Procedure(s) Performed: OPEN ARTHROTOMY LEFT KNEE WITH SYNOVECTOMY AND POLY-LINER EXCHANGE (Left: Knee)  Patient Location: PACU  Anesthesia Type:Spinal and MAC combined with regional for post-op pain  Level of Consciousness: awake, alert , and patient cooperative  Airway & Oxygen Therapy: Patient Spontanous Breathing and Patient connected to face mask oxygen  Post-op Assessment: Report given to RN and Post -op Vital signs reviewed and stable  Post vital signs: Reviewed and stable  Last Vitals:  Vitals Value Taken Time  BP 131/70 05/06/22 1549  Temp    Pulse 82 05/06/22 1552  Resp 17 05/06/22 1552  SpO2 99 % 05/06/22 1552  Vitals shown include unvalidated device data.  Last Pain:  Vitals:   05/06/22 1350  TempSrc:   PainSc: 0-No pain         Complications: No notable events documented.

## 2022-05-07 LAB — BASIC METABOLIC PANEL
Anion gap: 9 (ref 5–15)
BUN: 14 mg/dL (ref 8–23)
CO2: 24 mmol/L (ref 22–32)
Calcium: 8.6 mg/dL — ABNORMAL LOW (ref 8.9–10.3)
Chloride: 100 mmol/L (ref 98–111)
Creatinine, Ser: 0.61 mg/dL (ref 0.44–1.00)
GFR, Estimated: >60 mL/min (ref >60)
Glucose, Bld: 161 mg/dL — ABNORMAL HIGH (ref 70–99)
Potassium: 4.1 mmol/L (ref 3.5–5.1)
Sodium: 133 mmol/L — ABNORMAL LOW (ref 135–145)

## 2022-05-07 LAB — C-REACTIVE PROTEIN: CRP: 2.4 mg/dL — ABNORMAL HIGH (ref <1.0)

## 2022-05-07 LAB — AEROBIC/ANAEROBIC CULTURE W GRAM STAIN (SURGICAL/DEEP WOUND): Culture: NO GROWTH

## 2022-05-07 LAB — CBC
HCT: 28.7 % — ABNORMAL LOW (ref 36.0–46.0)
Hemoglobin: 8.5 g/dL — ABNORMAL LOW (ref 12.0–15.0)
MCH: 19.2 pg — ABNORMAL LOW (ref 26.0–34.0)
MCHC: 29.6 g/dL — ABNORMAL LOW (ref 30.0–36.0)
MCV: 64.8 fL — ABNORMAL LOW (ref 80.0–100.0)
Platelets: 264 10*3/uL (ref 150–400)
RBC: 4.43 MIL/uL (ref 3.87–5.11)
RDW: 15.6 % — ABNORMAL HIGH (ref 11.5–15.5)
WBC: 8.3 10*3/uL (ref 4.0–10.5)
nRBC: 0 % (ref 0.0–0.2)

## 2022-05-07 MED ORDER — SUMATRIPTAN SUCCINATE 25 MG PO TABS
25.0000 mg | ORAL_TABLET | ORAL | Status: DC | PRN
  Administered 2022-05-07: 25 mg via ORAL
  Filled 2022-05-07 (×2): qty 1

## 2022-05-07 MED ORDER — CYCLOBENZAPRINE HCL 10 MG PO TABS
10.0000 mg | ORAL_TABLET | Freq: Three times a day (TID) | ORAL | Status: DC | PRN
  Administered 2022-05-07 – 2022-05-09 (×6): 10 mg via ORAL
  Filled 2022-05-07 (×6): qty 1

## 2022-05-07 NOTE — Evaluation (Signed)
Physical Therapy Evaluation Patient Details Name: Shannon Jennings MRN: [id number] DOB: 03/03/1960 Today's Date: 05/07/2022  History of Present Illness  Pt s/p L TKR open arthrotomy with liner exchange and wtih hx of CAD, Bil TKR, Graves Dz, CAD, and Fibromyalgia  Clinical Impression  Pt admitted as above and presenting with functional mobility limitations 2* decreased L LE strength/ROM and post op pain.  Pt should progress to dc home with family assist.     Recommendations for follow up therapy are one component of a multi-disciplinary discharge planning process, led by the attending physician.  Recommendations may be updated based on patient status, additional functional criteria and insurance authorization.  Follow Up Recommendations       Assistance Recommended at Discharge Intermittent Supervision/Assistance  Patient can return home with the following  A little help with walking and/or transfers;A little help with bathing/dressing/bathroom;Assistance with cooking/housework;Assist for transportation;Help with stairs or ramp for entrance    Equipment Recommendations None recommended by PT  Recommendations for Other Services       Functional Status Assessment Patient has had a recent decline in their functional status and demonstrates the ability to make significant improvements in function in a reasonable and predictable amount of time.     Precautions / Restrictions Precautions Precautions: Fall Restrictions Weight Bearing Restrictions: No LLE Weight Bearing: Weight bearing as tolerated      Mobility  Bed Mobility Overal bed mobility: Needs Assistance Bed Mobility: Supine to Sit     Supine to sit: Min assist     General bed mobility comments: INcreased time with assist to manage R LE    Transfers Overall transfer level: Needs assistance Equipment used: Rolling walker (2 wheels) Transfers: Sit to/from Stand Sit to Stand: Min assist           General transfer  comment: cues for LE management and use of UEs to self assist    Ambulation/Gait Ambulation/Gait assistance: Min assist, Min guard Gait Distance (Feet): 18 Feet Assistive device: Rolling walker (2 wheels) Gait Pattern/deviations: Step-to pattern, Decreased step length - right, Decreased step length - left, Shuffle, Trunk flexed Gait velocity: decr     General Gait Details: Increased time with cues for sequence, posture and position from RW; Noted difficulty fully extending knee and achieving heel contact with gait  Stairs            Wheelchair Mobility    Modified Rankin (Stroke Patients Only)       Balance Overall balance assessment: Needs assistance Sitting-balance support: No upper extremity supported, Feet supported Sitting balance-Leahy Scale: Good     Standing balance support: Bilateral upper extremity supported Standing balance-Leahy Scale: Poor                               Pertinent Vitals/Pain Pain Assessment Pain Assessment: 0-10 Pain Score: 7  Pain Location: L knee Pain Descriptors / Indicators: Aching, Grimacing, Guarding, Sore Pain Intervention(s): Limited activity within patient's tolerance, Monitored during session, Premedicated before session, Ice applied    Home Living Family/patient expects to be discharged to:: Private residence Living Arrangements: Spouse/significant other Available Help at Discharge: Family;Available 24 hours/day Type of Home: House Home Access: Level entry       Home Layout: Two level;Able to live on main level with bedroom/bathroom Home Equipment: Rolling Walker (2 wheels);Toilet riser      Prior Function Prior Level of Function : Independent/Modified Independent;Driving;Working/employed  Mobility Comments: ind ADLs Comments: ind     Hand Dominance   Dominant Hand: Right    Extremity/Trunk Assessment   Upper Extremity Assessment Upper Extremity Assessment: Overall WFL for tasks  assessed    Lower Extremity Assessment Lower Extremity Assessment: LLE deficits/detail       Communication   Communication: No difficulties  Cognition Arousal/Alertness: Awake/alert Behavior During Therapy: WFL for tasks assessed/performed Overall Cognitive Status: Within Functional Limits for tasks assessed                                          General Comments      Exercises General Exercises - Lower Extremity Ankle Circles/Pumps: AROM, Both, 15 reps, Supine   Assessment/Plan    PT Assessment Patient needs continued PT services  PT Problem List Decreased strength;Decreased range of motion;Decreased activity tolerance;Decreased balance;Decreased mobility;Decreased knowledge of use of DME;Pain       PT Treatment Interventions DME instruction;Gait training;Stair training;Functional mobility training;Therapeutic activities;Therapeutic exercise;Patient/family education    PT Goals (Current goals can be found in the Care Plan section)  Acute Rehab PT Goals Patient Stated Goal: LEss pain and regain IND PT Goal Formulation: With patient Time For Goal Achievement: 05/14/22 Potential to Achieve Goals: Good    Frequency 7X/week     Co-evaluation               AM-PAC PT "6 Clicks" Mobility  Outcome Measure Help needed turning from your back to your side while in a flat bed without using bedrails?: A Little Help needed moving from lying on your back to sitting on the side of a flat bed without using bedrails?: A Little Help needed moving to and from a bed to a chair (including a wheelchair)?: A Little Help needed standing up from a chair using your arms (e.g., wheelchair or bedside chair)?: A Little Help needed to walk in hospital room?: A Little Help needed climbing 3-5 steps with a railing? : A Lot 6 Click Score: 17    End of Session Equipment Utilized During Treatment: Gait belt Activity Tolerance: Patient limited by pain Patient left: in  chair;with call bell/phone within reach;with chair alarm set Nurse Communication: Mobility status PT Visit Diagnosis: Unsteadiness on feet (R26.81);Difficulty in walking, not elsewhere classified (R26.2);Pain Pain - Right/Left: Left Pain - part of body: Knee    Time: 0865-7846 PT Time Calculation (min) (ACUTE ONLY): 23 min   Charges:   PT Evaluation $PT Eval Low Complexity: 1 Low          Mauro Kaufmann PT Acute Rehabilitation Services Pager [phone] Office [phone]   Rael Yo 05/07/2022, 1:26 PM

## 2022-05-07 NOTE — Progress Notes (Signed)
Patient ID: Shannon Jennings, female   DOB: 1960-02-27, 62 y.o.   MRN: [id number] The patient reports that she did have considerable pain last night and was very uncomfortable.  The Gram stain from our office from the fluid we took off her knee remained negative.  The Gram stain and culture from both fluid and soft tissue that we took from her knee yesterday show no evidence of infection.  However I still suspect that that may be the case.  Her CRP has gone down to 2 from 18.  Her white blood cell count is also normal.  She understands that I want to have her here through the weekend on IV antibiotics so we can see what the cultures show.  I have encouraged her to get moving with physical therapy and try to get her knee bending and moving.  She understands this as well.

## 2022-05-07 NOTE — Progress Notes (Signed)
PHYSICAL THERAPY  4:55 pm  room dark and pt sleeping.  I did not disturb.  Pt will have PT in am.  Felecia Shelling  PTA Acute  Rehabilitation Services Office M-F          [phone]

## 2022-05-07 NOTE — Progress Notes (Signed)
PHYSICAL THERAPY  3:40 pm pt requesting to "rest".  Currently in bed.  Will attempt to see later today.  Felecia Shelling  PTA Acute  Rehabilitation Services Office M-F          [phone]

## 2022-05-08 LAB — CBC
HCT: 27.8 % — ABNORMAL LOW (ref 36.0–46.0)
Hemoglobin: 8.3 g/dL — ABNORMAL LOW (ref 12.0–15.0)
MCH: 19.3 pg — ABNORMAL LOW (ref 26.0–34.0)
MCHC: 29.9 g/dL — ABNORMAL LOW (ref 30.0–36.0)
MCV: 64.5 fL — ABNORMAL LOW (ref 80.0–100.0)
Platelets: 275 10*3/uL (ref 150–400)
RBC: 4.31 MIL/uL (ref 3.87–5.11)
RDW: 15.6 % — ABNORMAL HIGH (ref 11.5–15.5)
WBC: 9.8 10*3/uL (ref 4.0–10.5)
nRBC: 0 % (ref 0.0–0.2)

## 2022-05-08 LAB — AEROBIC/ANAEROBIC CULTURE W GRAM STAIN (SURGICAL/DEEP WOUND)

## 2022-05-08 NOTE — Progress Notes (Signed)
  Subjective: Shannon Jennings is a 62 y.o. female s/p left knee poly exchange.  They are POD 2.  Pt's pain is controlled.  Pt denies any complain of chest pain, shortness of breath, abdominal pain, calf pain.  Patient denies any fevers or chills.  She ambulated well with physical therapy today, walking about 100 feet.  Objective: Vital signs in last 24 hours: Temp:  [98.1 F (36.7 C)-98.6 F (37 C)] 98.6 F (37 C) (05/05 0505) Pulse Rate:  [85-100] 85 (05/05 0505) Resp:  [17-18] 18 (05/05 0505) BP: (122-158)/(71-73) 122/71 (05/05 0505) SpO2:  [95 %-97 %] 97 % (05/05 0505)  Intake/Output from previous day: 05/04 0701 - 05/05 0700 In: 1306.7 [P.O.:360; I.V.:646.7; IV Piggyback:300] Out: 1075 [Urine:1075] Intake/Output this shift: Total I/O In: 240 [P.O.:240] Out: -   Exam:  No gross blood or drainage overlying the dressing 2+ DP pulse Sensation intact distally in the operative foot Able to dorsiflex and plantarflex the operative foot No calf tenderness.  Negative Homans' sign. Able to perform straight leg raise   Labs: Recent Labs    05/07/22 0446 05/08/22 0319  HGB 8.5* 8.3*   Recent Labs    05/07/22 0446 05/08/22 0319  WBC 8.3 9.8  RBC 4.43 4.31  HCT 28.7* 27.8*  PLT 264 275   Recent Labs    05/07/22 0446  NA 133*  K 4.1  CL 100  CO2 24  BUN 14  CREATININE 0.61  GLUCOSE 161*  CALCIUM 8.6*   No results for input(s): "LABPT", "INR" in the last 72 hours.  Assessment/Plan: Pt is POD2 s/p TKA.    -Plan to discharge to home in coming days patient's pain and PT eval. Need another day for following cultures and likely discharge Monday or Tuesday after re-eval by Dr Shannon Jennings   Shannon Jennings with a walker  -Follow-up with Dr. Magnus Jennings in clinic 2 weeks postoperatively    [facility] 05/08/2022, 2:07 PM

## 2022-05-08 NOTE — Progress Notes (Signed)
Physical Therapy Treatment Patient Details Name: Shannon Jennings MRN: [id number] DOB: Dec 10, 1960 Today's Date: 05/08/2022   History of Present Illness Pt s/p L TKR open arthrotomy with liner exchange and wtih hx of CAD, Bil TKR, Graves Dz, CAD, and Fibromyalgia    PT Comments    Pt very motivated and with marked improvement noted in pain control and activity tolerance.  Pt up to ambulate increased distance in hall with noted improvement in WB tolerance on L LE.   Recommendations for follow up therapy are one component of a multi-disciplinary discharge planning process, led by the attending physician.  Recommendations may be updated based on patient status, additional functional criteria and insurance authorization.  Follow Up Recommendations       Assistance Recommended at Discharge Intermittent Supervision/Assistance  Patient can return home with the following A little help with walking and/or transfers;A little help with bathing/dressing/bathroom;Assistance with cooking/housework;Assist for transportation;Help with stairs or ramp for entrance   Equipment Recommendations  None recommended by PT    Recommendations for Other Services       Precautions / Restrictions Precautions Precautions: Fall Restrictions Weight Bearing Restrictions: No LLE Weight Bearing: Weight bearing as tolerated     Mobility  Bed Mobility               General bed mobility comments: Pt received up in chair and requests back to same    Transfers Overall transfer level: Needs assistance Equipment used: Rolling walker (2 wheels) Transfers: Sit to/from Stand Sit to Stand: Min assist           General transfer comment: cues for LE management and use of UEs to self assist    Ambulation/Gait Ambulation/Gait assistance: Min assist, Min guard Gait Distance (Feet): 100 Feet Assistive device: Rolling walker (2 wheels) Gait Pattern/deviations: Step-to pattern, Decreased step length - right,  Decreased step length - left, Shuffle, Trunk flexed Gait velocity: decr     General Gait Details: Increased time with cues for sequence, posture and position from RW; Noted improvement with pt ability to fully extend knee and achieve heel contact with gait   Stairs             Wheelchair Mobility    Modified Rankin (Stroke Patients Only)       Balance Overall balance assessment: Needs assistance Sitting-balance support: No upper extremity supported, Feet supported Sitting balance-Leahy Scale: Good     Standing balance support: Single extremity supported Standing balance-Leahy Scale: Poor                              Cognition Arousal/Alertness: Awake/alert Behavior During Therapy: WFL for tasks assessed/performed Overall Cognitive Status: Within Functional Limits for tasks assessed                                          Exercises      General Comments        Pertinent Vitals/Pain Pain Assessment Pain Assessment: 0-10 Pain Score: 4  Pain Location: L knee Pain Descriptors / Indicators: Aching, Sore Pain Intervention(s): Limited activity within patient's tolerance, Monitored during session, Premedicated before session    Home Living                          Prior Function  PT Goals (current goals can now be found in the care plan section) Acute Rehab PT Goals Patient Stated Goal: LEss pain and regain IND PT Goal Formulation: With patient Time For Goal Achievement: 05/14/22 Potential to Achieve Goals: Good Progress towards PT goals: Progressing toward goals    Frequency    7X/week      PT Plan Current plan remains appropriate    Co-evaluation              AM-PAC PT "6 Clicks" Mobility   Outcome Measure  Help needed turning from your back to your side while in a flat bed without using bedrails?: A Little Help needed moving from lying on your back to sitting on the side of a flat  bed without using bedrails?: A Little Help needed moving to and from a bed to a chair (including a wheelchair)?: A Little Help needed standing up from a chair using your arms (e.g., wheelchair or bedside chair)?: A Little Help needed to walk in hospital room?: A Little Help needed climbing 3-5 steps with a railing? : A Lot 6 Click Score: 17    End of Session Equipment Utilized During Treatment: Gait belt Activity Tolerance: Patient tolerated treatment well Patient left: in chair;with call bell/phone within reach;with chair alarm set Nurse Communication: Mobility status PT Visit Diagnosis: Unsteadiness on feet (R26.81);Difficulty in walking, not elsewhere classified (R26.2);Pain Pain - Right/Left: Left Pain - part of body: Knee     Time: 1610-9604 PT Time Calculation (min) (ACUTE ONLY): 16 min  Charges:  $Gait Training: 8-22 mins                     Mauro Kaufmann PT Acute Rehabilitation Services Pager [phone] Office [phone]    Milena Liggett 05/08/2022, 1:33 PM

## 2022-05-08 NOTE — Plan of Care (Signed)
  Problem: Coping: Goal: Level of anxiety will decrease Outcome: Progressing   Problem: Pain Managment: Goal: General experience of comfort will improve Outcome: Progressing   Problem: Safety: Goal: Ability to remain free from injury will improve Outcome: Progressing   

## 2022-05-08 NOTE — Plan of Care (Signed)

## 2022-05-09 MED ORDER — HYDROCODONE-ACETAMINOPHEN 5-325 MG PO TABS: 1.0000 | ORAL_TABLET | Freq: Four times a day (QID) | ORAL | 0 refills | Status: DC | PRN

## 2022-05-09 MED ORDER — PREDNISONE 20 MG PO TABS
20.0000 mg | ORAL_TABLET | Freq: Once | ORAL | Status: AC
  Administered 2022-05-09: 20 mg via ORAL
  Filled 2022-05-09: qty 1

## 2022-05-09 MED ORDER — CYCLOBENZAPRINE HCL 10 MG PO TABS: 10.0000 mg | ORAL_TABLET | Freq: Three times a day (TID) | ORAL | 0 refills | Status: DC | PRN

## 2022-05-09 MED ORDER — CELECOXIB 200 MG PO CAPS: 200.0000 mg | ORAL_CAPSULE | Freq: Two times a day (BID) | ORAL | 1 refills | Status: DC | PRN

## 2022-05-09 NOTE — Progress Notes (Signed)
Patient ID: Shannon Jennings, female   DOB: 04/05/1960, 62 y.o.   MRN: [id number] The patient's Gram stain and cultures from her left knee continued to remain negative.  This certainly can be an inflammatory process.  The cultures were both fluid and tissue cultures.  Even the Gram stain from our office was negative.  Her knee looks much better today.  The swelling is gone down significantly and there is no redness.  Her vital signs are stable.  She would like to be able to go home today.  I do feel that this is reasonable.  I am going to give her 1 dose of prednisone this morning.  I will then send her home on pain medications and if the cultures change she noticed that we will be in touch with her.  She agrees with this treatment plan.

## 2022-05-09 NOTE — Discharge Instructions (Signed)
Do increase her activities as comfort allows. Work on bending your left knee is much as possible. The current dressing can get wet in the shower daily. To continue taking your doxycycline twice daily.

## 2022-05-09 NOTE — Progress Notes (Signed)
Physical Therapy Treatment Patient Details Name: Shannon Jennings MRN: [id number] DOB: 04/22/1960 Today's Date: 05/09/2022   History of Present Illness Pt s/p L TKR open arthrotomy with liner exchange and wtih hx of CAD, Bil TKR, Graves Dz, CAD, and Fibromyalgia    PT Comments    Pt reports being a little more sore today, feels ready to d/c home. Pt is progressing nicely with mobility at overall mod I to supervision level, has DME at home.   Recommendations for follow up therapy are one component of a multi-disciplinary discharge planning process, led by the attending physician.  Recommendations may be updated based on patient status, additional functional criteria and insurance authorization.  Follow Up Recommendations       Assistance Recommended at Discharge Intermittent Supervision/Assistance  Patient can return home with the following A little help with walking and/or transfers;A little help with bathing/dressing/bathroom;Assistance with cooking/housework;Assist for transportation;Help with stairs or ramp for entrance   Equipment Recommendations  None recommended by PT    Recommendations for Other Services       Precautions / Restrictions Precautions Precautions: Fall Restrictions Weight Bearing Restrictions: No LLE Weight Bearing: Weight bearing as tolerated     Mobility  Bed Mobility Overal bed mobility: Modified Independent                  Transfers Overall transfer level: Needs assistance Equipment used: Rolling walker (2 wheels) Transfers: Sit to/from Stand Sit to Stand: Supervision           General transfer comment: cues for LE management and use of UEs to self assist; from bed and [facility]    Ambulation/Gait Ambulation/Gait assistance: Supervision, Modified independent (Device/Increase time) Gait Distance (Feet): 120 Feet (10') Assistive device: Rolling walker (2 wheels) Gait Pattern/deviations: Step-to pattern, Decreased step length - right,  Decreased step length - left, Shuffle, Trunk flexed Gait velocity: decr     General Gait Details: Increased time with cues for sequence, posture and position from RW; Noted improvement with pt ability to fully extend knee and achieve heel contact with gait   Stairs             Wheelchair Mobility    Modified Rankin (Stroke Patients Only)       Balance Overall balance assessment: Needs assistance Sitting-balance support: No upper extremity supported, Feet supported Sitting balance-Leahy Scale: Good     Standing balance support: Single extremity supported Standing balance-Leahy Scale: Fair Standing balance comment: able to perform own pericare witout LOB, reliant on device for gait                            Cognition Arousal/Alertness: Awake/alert Behavior During Therapy: WFL for tasks assessed/performed Overall Cognitive Status: Within Functional Limits for tasks assessed                                          Exercises      General Comments        Pertinent Vitals/Pain Pain Assessment Pain Assessment: 0-10 Pain Score: 6  Pain Location: L knee Pain Descriptors / Indicators: Aching, Sore Pain Intervention(s): Limited activity within patient's tolerance, Monitored during session, Repositioned, Premedicated before session, Patient requesting pain meds-RN notified, RN gave pain meds during session (declined ice)    Home Living  Prior Function            PT Goals (current goals can now be found in the care plan section) Acute Rehab PT Goals Patient Stated Goal: LEss pain and regain IND PT Goal Formulation: With patient Time For Goal Achievement: 05/14/22 Potential to Achieve Goals: Good Progress towards PT goals: Progressing toward goals    Frequency    7X/week      PT Plan Current plan remains appropriate    Co-evaluation              AM-PAC PT "6 Clicks" Mobility    Outcome Measure  Help needed turning from your back to your side while in a flat bed without using bedrails?: A Little Help needed moving from lying on your back to sitting on the side of a flat bed without using bedrails?: A Little Help needed moving to and from a bed to a chair (including a wheelchair)?: A Little Help needed standing up from a chair using your arms (e.g., wheelchair or bedside chair)?: A Little Help needed to walk in hospital room?: A Little Help needed climbing 3-5 steps with a railing? : A Lot 6 Click Score: 17    End of Session Equipment Utilized During Treatment: Gait belt Activity Tolerance: Patient tolerated treatment well Patient left: with call bell/phone within reach;in bed;with bed alarm set Nurse Communication: Mobility status PT Visit Diagnosis: Unsteadiness on feet (R26.81);Difficulty in walking, not elsewhere classified (R26.2);Pain Pain - Right/Left: Left Pain - part of body: Knee     Time: 1010-1024 PT Time Calculation (min) (ACUTE ONLY): 14 min  Charges:  $Gait Training: 8-22 mins                     Delice Bison, PT  Acute Rehab Dept [facility]) [phone]  05/09/2022    [facility] 05/09/2022, 10:27 AM

## 2022-05-09 NOTE — Discharge Summary (Signed)
Patient ID: Shannon Jennings MRN: [id number] DOB/AGE: 1960-07-16 62 y.o.  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 x 4 yr.   Hyperthyroidism    Hypothyroidism    Osteoarthritis    S/P coronary artery stent placement 04/2011   states right coronary artery   Thalassemia minor     Surgeries: Procedure(s): OPEN ARTHROTOMY LEFT KNEE WITH SYNOVECTOMY AND POLY-LINER EXCHANGE on 05/06/2022   Consultants:   Discharged Condition: Improved  Hospital Course: Shannon Jennings is an 62 y.o. female who was admitted 05/06/2022 for operative treatment ofSynovitis of left knee. Patient has severe unremitting pain that affects sleep, daily activities, and work/hobbies. After pre-op clearance the patient was taken to the operating room on 05/06/2022 and underwent  Procedure(s): OPEN ARTHROTOMY LEFT KNEE WITH SYNOVECTOMY AND POLY-LINER EXCHANGE.    Patient was given perioperative antibiotics:  Anti-infectives (From admission, onward)    Start     Dose/Rate Route Frequency Ordered Stop   05/06/22 2200  ceFAZolin (ANCEF) IVPB 2g/100 mL premix        2 g 200 mL/hr over 30 Minutes Intravenous Every 8 hours 05/06/22 1744     05/06/22 1512  vancomycin (VANCOCIN) powder  Status:  Discontinued          As needed 05/06/22 1512 05/06/22 1736   05/06/22 1500  ceFAZolin (ANCEF) IVPB 2g/100 mL premix        2  g 200 mL/hr over 30 Minutes Intravenous  Once 05/06/22 1445 05/06/22 1502   05/06/22 1415  vancomycin (VANCOCIN) IVPB 1000 mg/200 mL premix  Status:  Discontinued        1,000 mg 200 mL/hr over 60 Minutes Intravenous  Once 05/06/22 1409 05/06/22 1736        Patient was given sequential compression devices, early ambulation, and chemoprophylaxis to prevent DVT.  Patient benefited maximally from hospital stay and there were no complications.    The patient's Gram stain and cultures remain negative for any infection.  This certainly can be an inflammatory process but still could be infection.  We will discharge her to home today on anti-inflammatories as well as pain medication.  She does have doxycycline at home which she will continue.  If anything changes with her cultures, we will let her know and treat this accordingly.  Recent vital signs: Patient Vitals for the past 24 hrs:  BP Temp Temp src Pulse Resp SpO2  05/08/22 1941 (!) 145/67 98.6 F (37 C) Oral 87 18 97 %  05/08/22 1433 (!) 110/53 99.3 F (37.4 C) Oral 91 18 100 %     Recent laboratory studies:  Recent Labs    05/07/22 0446 05/08/22 0319  WBC 8.3 9.8  HGB 8.5* 8.3*  HCT 28.7* 27.8*  PLT 264 275  NA 133*  --   K 4.1  --   CL 100  --   CO2 24  --   BUN 14  --   CREATININE 0.61  --   GLUCOSE 161*  --   CALCIUM 8.6*  --      Discharge Medications:   Allergies as of 05/09/2022       Reactions   Tizanidine    Chest pain    Protonix [pantoprazole Sodium] Other (See Comments)   Kidney stones   Toradol [ketorolac Tromethamine]    Chest pain    Trokendi Xr [topiramate Er]    Double vision    Dilaudid [hydromorphone Hcl] Other (See Comments)   manic   Penicillins Rash   Sulfa Antibiotics Rash        Medication List     STOP taking these medications    methocarbamol 500 MG tablet Commonly known as: ROBAXIN       TAKE these medications    aspirin 81 MG chewable tablet Chew 1 tablet (81 mg  total) by mouth 2 (two) times daily. What changed: when to take this   carvedilol 25 MG tablet Commonly known as: COREG Take 25 mg by mouth 2 (two) times daily.   celecoxib 200 MG capsule Commonly known as: CELEBREX Take 1 capsule (200 mg total) by mouth 2 (two) times daily between meals as needed.   cyclobenzaprine 10 MG tablet Commonly known as: FLEXERIL Take 1 tablet (10 mg total) by mouth 3 (three) times daily as needed for muscle spasms.   D3-50 1.25 MG (50000 UT) capsule Generic drug: Cholecalciferol Take 50,000 Units by mouth once a week.   DULoxetine 30 MG capsule Commonly known as: CYMBALTA TAKE 1 CAPSULE BY MOUTH EVERY DAY What changed:  how much to take when to take this   Hemocyte Plus 106-1 MG Caps Take 1 capsule by mouth every evening.   HYDROcodone-acetaminophen 5-325 MG tablet Commonly known as: NORCO/VICODIN Take 1-2 tablets by mouth every 6 (six) hours as needed for moderate pain.   levothyroxine 100 MCG tablet Commonly known as: SYNTHROID Take 100 mcg by mouth every morning.   rosuvastatin 10 MG tablet Commonly known as: CRESTOR Take 10 mg by mouth daily.   SUMAtriptan 50 MG tablet Commonly known as: IMITREX Take 25 mg by mouth every 2 (two) hours as needed for migraine.        Diagnostic Studies: No results found.  Disposition: Discharge disposition: 01-Home or Self Care            Signed: Kathryne Hitch 05/09/2022, 7:45 AM

## 2022-05-09 NOTE — TOC Transition Note (Signed)
Transition of Care [facility]) - CM/SW Discharge Note   Patient Details  Name: Shannon Jennings MRN: [id number] Date of Birth: 1960-03-18  [facility] CM/SW Contact:  Amada Jupiter, LCSW Phone Number: 05/09/2022, 11:36 AM   Clinical Narrative:     Met with pt who confirms she has needed DME at home.  Notes she had OPPT after prior surgeries and will contact Dr. Eliberto Ivory office to arrange to do this again.  No further TOC needs.  Final next level of care: Home/Self Care (vs. OPPT) Barriers to Discharge: No Barriers Identified   Patient Goals and CMS Choice      Discharge Placement                         Discharge Plan and Services Additional resources added to the After Visit Summary for                  DME Arranged: N/A DME Agency: NA                  Social Determinants of Health (SDOH) Interventions SDOH Screenings   Food Insecurity: No Food Insecurity (05/06/2022)  Housing: Low Risk  (05/06/2022)  Transportation Needs: No Transportation Needs (05/06/2022)  Utilities: Not At Risk (05/06/2022)  Tobacco Use: Low Risk  (05/06/2022)     Readmission Risk Interventions    05/09/2022   11:35 AM  Readmission Risk Prevention Plan  Post Dischage Appt Complete  Medication Screening Complete  Transportation Screening Complete

## 2022-05-10 ENCOUNTER — Telehealth: Payer: Self-pay | Admitting: Orthopaedic Surgery

## 2022-05-10 LAB — AEROBIC/ANAEROBIC CULTURE W GRAM STAIN (SURGICAL/DEEP WOUND)

## 2022-05-10 NOTE — Telephone Encounter (Signed)
Faxed to provided number  

## 2022-05-10 NOTE — Telephone Encounter (Signed)
Patient states she ned an updated note stating she did have surgery Friday and will be out with no return date. Please fax to --[facility]--[phone]

## 2022-05-11 ENCOUNTER — Other Ambulatory Visit: Payer: Self-pay | Admitting: Orthopaedic Surgery

## 2022-05-11 ENCOUNTER — Encounter (HOSPITAL_COMMUNITY): Payer: Self-pay | Admitting: Orthopaedic Surgery

## 2022-05-11 LAB — AEROBIC/ANAEROBIC CULTURE W GRAM STAIN (SURGICAL/DEEP WOUND): Gram Stain: NONE SEEN

## 2022-05-11 MED ORDER — RIFAMPIN 150 MG PO CAPS: 300.0000 mg | ORAL_CAPSULE | Freq: Two times a day (BID) | ORAL | 3 refills | Status: AC

## 2022-05-11 MED ORDER — AMOXICILLIN 875 MG PO TABS: 875.0000 mg | ORAL_TABLET | Freq: Two times a day (BID) | ORAL | 3 refills | Status: DC

## 2022-05-13 ENCOUNTER — Other Ambulatory Visit: Payer: Self-pay | Admitting: Orthopaedic Surgery

## 2022-05-13 ENCOUNTER — Telehealth: Payer: Self-pay | Admitting: Orthopaedic Surgery

## 2022-05-13 MED ORDER — HYDROCODONE-ACETAMINOPHEN 5-325 MG PO TABS: 1.0000 | ORAL_TABLET | Freq: Four times a day (QID) | ORAL | 0 refills | Status: DC | PRN

## 2022-05-13 NOTE — Telephone Encounter (Signed)
Patient requesting refill on Hydrocodone sent to [facility] pharmacy

## 2022-05-16 ENCOUNTER — Telehealth: Payer: Self-pay | Admitting: Orthopaedic Surgery

## 2022-05-16 NOTE — Telephone Encounter (Signed)
[employer] Disability form received. To [employer].

## 2022-05-17 ENCOUNTER — Other Ambulatory Visit: Payer: Self-pay | Admitting: Orthopaedic Surgery

## 2022-05-19 ENCOUNTER — Encounter: Payer: Self-pay | Admitting: Physician Assistant

## 2022-05-19 ENCOUNTER — Ambulatory Visit (INDEPENDENT_AMBULATORY_CARE_PROVIDER_SITE_OTHER): Payer: PRIVATE HEALTH INSURANCE | Admitting: Physician Assistant

## 2022-05-19 DIAGNOSIS — T8459XD Infection and inflammatory reaction due to other internal joint prosthesis, subsequent encounter: Secondary | ICD-10-CM

## 2022-05-19 DIAGNOSIS — Z96652 Presence of left artificial knee joint: Secondary | ICD-10-CM

## 2022-05-19 DIAGNOSIS — Z96659 Presence of unspecified artificial knee joint: Secondary | ICD-10-CM

## 2022-05-19 NOTE — Progress Notes (Signed)
HPI: Mrs. Jasko returns today status post left knee open arthrotomy with synovectomy and poly liner exchange.  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  She is currently on rifampin and amoxicillin.  She states she has had some GI upset and decreased appetite.  Denies any fevers chills.  States that her pain at worst is 8 out of 10 at night.  She denies requesting refill on her tramadol that she is taking for pain.  She is ambulating with a cane.  Review of systems see HPI otherwise negative  Physical exam: Left knee lacks last few degrees in full extension flexes to approximately 105 degrees.  Surgical incisions healing well no signs of infection.  Staples well-approximated skin.  Left calf supple nontender.  Left ankle dorsiflexion plantarflexion intact.  Impression: Status post left knee open arthrotomy with synovectomy and poly liner exchange Infected left total knee  Plan: Will set her up for outpatient physical therapy at [facility] in [place].  Will continue her off amoxicillin most likely for a year.  Staples were harvested today Steri-Strips applied.  Follow-up in 2 weeks for wound check.

## 2022-05-20 ENCOUNTER — Telehealth: Payer: Self-pay | Admitting: Physician Assistant

## 2022-05-20 ENCOUNTER — Other Ambulatory Visit: Payer: Self-pay | Admitting: Physician Assistant

## 2022-05-20 ENCOUNTER — Other Ambulatory Visit: Payer: Self-pay

## 2022-05-20 DIAGNOSIS — Z96659 Presence of unspecified artificial knee joint: Secondary | ICD-10-CM

## 2022-05-20 DIAGNOSIS — Z96652 Presence of left artificial knee joint: Secondary | ICD-10-CM

## 2022-05-20 MED ORDER — TRAMADOL HCL 50 MG PO TABS: 50.0000 mg | ORAL_TABLET | Freq: Four times a day (QID) | ORAL | 0 refills | Status: DC | PRN

## 2022-05-20 NOTE — Telephone Encounter (Signed)
Patient called advised the Rx for Tramadol is not showing at the [facility] in [place]. Patient asked for a call back when Rx is sent to the pharmacy. Patient asked if she can get the regular Rx to go through Tuesday and send a Rx for the Extended release Tramadol ER 100Q6.  The number to contact patient is [phone]

## 2022-05-24 ENCOUNTER — Other Ambulatory Visit: Payer: Self-pay | Admitting: Physician Assistant

## 2022-05-24 ENCOUNTER — Telehealth: Payer: Self-pay | Admitting: Physician Assistant

## 2022-05-24 MED ORDER — TRAMADOL HCL ER 100 MG PO TB24: 100.0000 mg | ORAL_TABLET | Freq: Every day | ORAL | 0 refills | Status: DC

## 2022-05-24 NOTE — Telephone Encounter (Signed)
LMOM for patient of the below message and that an order was sent for PT aswell

## 2022-05-24 NOTE — Telephone Encounter (Signed)
[place]--please fax to --[phone] Patient waiting on a referral for P/T to be sent to [facility] in [place]. Patient also stating that Tramadol xl is now available at her Pharmacy.. please refill to [facility] in [place]( [address])

## 2022-06-02 ENCOUNTER — Telehealth: Payer: Self-pay | Admitting: Orthopaedic Surgery

## 2022-06-02 ENCOUNTER — Other Ambulatory Visit: Payer: Self-pay | Admitting: Orthopaedic Surgery

## 2022-06-02 MED ORDER — TRAMADOL HCL ER 100 MG PO TB24: 200.0000 mg | ORAL_TABLET | Freq: Every day | ORAL | 0 refills | Status: DC | PRN

## 2022-06-02 NOTE — Telephone Encounter (Signed)
Patient aware this was sent in for her  

## 2022-06-02 NOTE — Telephone Encounter (Signed)
Patient asking for a Tramadol refill of a stronger dose. Please advise

## 2022-06-06 ENCOUNTER — Ambulatory Visit (INDEPENDENT_AMBULATORY_CARE_PROVIDER_SITE_OTHER): Payer: PRIVATE HEALTH INSURANCE | Admitting: Orthopaedic Surgery

## 2022-06-06 ENCOUNTER — Encounter: Payer: Self-pay | Admitting: Orthopaedic Surgery

## 2022-06-06 DIAGNOSIS — T8459XD Infection and inflammatory reaction due to other internal joint prosthesis, subsequent encounter: Secondary | ICD-10-CM

## 2022-06-06 DIAGNOSIS — Z96659 Presence of unspecified artificial knee joint: Secondary | ICD-10-CM

## 2022-06-06 DIAGNOSIS — Z96652 Presence of left artificial knee joint: Secondary | ICD-10-CM

## 2022-06-06 DIAGNOSIS — M659 Synovitis and tenosynovitis, unspecified: Secondary | ICD-10-CM

## 2022-06-06 NOTE — Progress Notes (Signed)
The patient continue to follow-up for her left knee.  She has an infection with a skin flora type of bacteria in the knee.  She is on amoxicillin and I had a rifampin as well but infectious disease stop that.  She put herself back on it when she is developing more swelling and redness.  She says that is subsided now significantly.  Overall she is feeling better.  She is on extended release tramadol which we just increased recently.  Overall her left knee does look better.  Is still swollen and warm to be expected but not red.  She is improving her range of motion as well.  Will see her back in 6 weeks to see how she is doing overall.  No x-rays are needed at that visit.  Will likely continue her antibiotics for 6 months to a year.

## 2022-06-09 ENCOUNTER — Telehealth: Payer: Self-pay | Admitting: Orthopedic Surgery

## 2022-06-09 ENCOUNTER — Telehealth: Payer: Self-pay | Admitting: Orthopaedic Surgery

## 2022-06-09 ENCOUNTER — Other Ambulatory Visit: Payer: Self-pay | Admitting: Orthopaedic Surgery

## 2022-06-09 MED ORDER — TRAMADOL HCL ER 100 MG PO TB24: 200.0000 mg | ORAL_TABLET | Freq: Every day | ORAL | 0 refills | Status: DC | PRN

## 2022-06-09 NOTE — Telephone Encounter (Signed)
Pt called stating she is going on vacation and will be out of tramadol half way thru vacation and asking for a paper script of refill so she can get filled while she is on vacation. Please call pt when ready for pick up at [phone].

## 2022-06-09 NOTE — Telephone Encounter (Signed)
Shannon Jennings is calling back regarding the early refill of Tramadol that she is requesting.  She states that her [facility] in [place] will fill the prescription if Dr. Magnus Ivan will write in the "body of the script" that Rx "can be filled on 06/14/22 due to patient going on vacation". She can be reached at [phone].

## 2022-06-09 NOTE — Telephone Encounter (Signed)
Lvm advising pt.

## 2022-06-09 NOTE — Telephone Encounter (Signed)
Called and advised pt. She stated understanding. She will cb with the pharmacy information

## 2022-06-21 ENCOUNTER — Telehealth: Payer: Self-pay | Admitting: Orthopaedic Surgery

## 2022-06-21 MED ORDER — CYCLOBENZAPRINE HCL 10 MG PO TABS: 10.0000 mg | ORAL_TABLET | Freq: Three times a day (TID) | ORAL | 0 refills | Status: DC | PRN

## 2022-06-21 NOTE — Telephone Encounter (Signed)
Pt called requesting a refill of Flexeril. Pt states to only take at night. Please send to pharmacy on file. Pt phone number is [phone].

## 2022-06-21 NOTE — Telephone Encounter (Signed)
Sent. Called and advised pt

## 2022-06-30 ENCOUNTER — Other Ambulatory Visit: Payer: Self-pay | Admitting: Physician Assistant

## 2022-06-30 ENCOUNTER — Telehealth: Payer: Self-pay | Admitting: Physician Assistant

## 2022-06-30 MED ORDER — TRAMADOL HCL ER 100 MG PO TB24: 200.0000 mg | ORAL_TABLET | Freq: Every day | ORAL | 0 refills | Status: DC | PRN

## 2022-06-30 NOTE — Telephone Encounter (Signed)
Pt called requesting a refill of tramadol. Please send to pharmacy on file. Pt phone number is [phone].

## 2022-07-04 ENCOUNTER — Other Ambulatory Visit: Payer: Self-pay | Admitting: Physician Assistant

## 2022-07-04 ENCOUNTER — Telehealth: Payer: Self-pay | Admitting: Physician Assistant

## 2022-07-04 MED ORDER — TRAMADOL HCL ER 100 MG PO TB24: 200.0000 mg | ORAL_TABLET | Freq: Every day | ORAL | 0 refills | Status: DC | PRN

## 2022-07-04 NOTE — Telephone Encounter (Signed)
Pt called stating that her [facility] pharmacy don't have her tramadol in  stock and asking for 7 supply to [facility] [place] to get her thru the holiday. Pt states she has been out of her meds for a few days and she is in sveere pain. Pt states she is unsure of when [facility] will be back ins tock. Pt phone number is [phone].

## 2022-07-08 ENCOUNTER — Telehealth: Payer: Self-pay | Admitting: Physician Assistant

## 2022-07-08 MED ORDER — CYCLOBENZAPRINE HCL 10 MG PO TABS: 10.0000 mg | ORAL_TABLET | Freq: Three times a day (TID) | ORAL | 0 refills | Status: DC | PRN

## 2022-07-08 NOTE — Telephone Encounter (Signed)
Sent to pharmacy 

## 2022-07-08 NOTE — Telephone Encounter (Signed)
Pt called requesting a refill of cyclobenzaprine. Please send to pharmacy on file. Pt phone number is [phone].

## 2022-07-08 NOTE — Telephone Encounter (Signed)
Called and advised.

## 2022-07-14 ENCOUNTER — Telehealth: Payer: Self-pay | Admitting: Orthopaedic Surgery

## 2022-07-14 ENCOUNTER — Other Ambulatory Visit: Payer: Self-pay | Admitting: Orthopaedic Surgery

## 2022-07-14 MED ORDER — TRAMADOL HCL ER 100 MG PO TB24: 200.0000 mg | ORAL_TABLET | Freq: Every day | ORAL | 0 refills | Status: DC | PRN

## 2022-07-14 NOTE — Telephone Encounter (Signed)
Pt requesting refill on Tramadol sent to [facility] on file please advise

## 2022-07-18 ENCOUNTER — Encounter: Payer: Self-pay | Admitting: Orthopaedic Surgery

## 2022-07-18 ENCOUNTER — Ambulatory Visit: Payer: PRIVATE HEALTH INSURANCE | Admitting: Orthopaedic Surgery

## 2022-07-18 DIAGNOSIS — Z96652 Presence of left artificial knee joint: Secondary | ICD-10-CM

## 2022-07-18 DIAGNOSIS — M659 Synovitis and tenosynovitis, unspecified: Secondary | ICD-10-CM

## 2022-07-18 DIAGNOSIS — T8459XD Infection and inflammatory reaction due to other internal joint prosthesis, subsequent encounter: Secondary | ICD-10-CM

## 2022-07-18 MED ORDER — TRAMADOL HCL 50 MG PO TABS: 50.0000 mg | ORAL_TABLET | Freq: Four times a day (QID) | ORAL | 0 refills | Status: DC | PRN

## 2022-07-18 NOTE — Progress Notes (Signed)
The patient is now 10 weeks out from a synovectomy with I&D and polyliner exchange of a left total knee infection.  This was with a normal skin flora that is sensitive to penicillin medications that we have on amoxicillin and rifampin.  She does need a refill of tramadol.  She is overall doing well and is going back to work part-time starting 20 July.  I did fill out paperwork for that.  Examination of her left knee shows good range of motion.  There are still some slight swelling and warmth but no redness.  There is no drainage.  Overall she is doing well.  From my standpoint we will likely have her on oral antibiotics for 6 months to a year.  I would like to see her back in 3 months with an AP and lateral of her left knee.  If there are issues before then she knows to let us know.  We will continue to refill her tramadol.

## 2022-07-23 ENCOUNTER — Other Ambulatory Visit: Payer: Self-pay | Admitting: Orthopaedic Surgery

## 2022-08-01 ENCOUNTER — Telehealth: Payer: Self-pay | Admitting: Orthopaedic Surgery

## 2022-08-01 ENCOUNTER — Other Ambulatory Visit: Payer: Self-pay | Admitting: Orthopaedic Surgery

## 2022-08-01 MED ORDER — CYCLOBENZAPRINE HCL 10 MG PO TABS: 10.0000 mg | ORAL_TABLET | Freq: Three times a day (TID) | ORAL | 0 refills | Status: DC | PRN

## 2022-08-01 MED ORDER — TRAMADOL HCL 50 MG PO TABS: 50.0000 mg | ORAL_TABLET | Freq: Four times a day (QID) | ORAL | 0 refills | Status: DC | PRN

## 2022-08-01 NOTE — Telephone Encounter (Signed)
Patient requesting refill on Cyclobenzaprine and Tramadol sent to [facility] in [place]

## 2022-08-17 ENCOUNTER — Other Ambulatory Visit: Payer: Self-pay | Admitting: Orthopaedic Surgery

## 2022-08-17 ENCOUNTER — Telehealth: Payer: Self-pay | Admitting: Orthopaedic Surgery

## 2022-08-17 MED ORDER — TRAMADOL HCL 50 MG PO TABS: 50.0000 mg | ORAL_TABLET | Freq: Three times a day (TID) | ORAL | 0 refills | Status: DC | PRN

## 2022-08-17 NOTE — Telephone Encounter (Signed)
Patient called. Would like Tramadol called in for her. Her cb# [phone]

## 2022-09-01 ENCOUNTER — Telehealth: Payer: Self-pay | Admitting: Orthopaedic Surgery

## 2022-09-01 ENCOUNTER — Other Ambulatory Visit: Payer: Self-pay | Admitting: Orthopaedic Surgery

## 2022-09-01 MED ORDER — CYCLOBENZAPRINE HCL 5 MG PO TABS: 5.0000 mg | ORAL_TABLET | Freq: Three times a day (TID) | ORAL | 1 refills | Status: DC | PRN

## 2022-09-01 MED ORDER — TRAMADOL HCL 50 MG PO TABS: 50.0000 mg | ORAL_TABLET | Freq: Three times a day (TID) | ORAL | 0 refills | Status: DC | PRN

## 2022-09-01 NOTE — Telephone Encounter (Signed)
Patient called requesting a refill on Cycloeenaprine. And if you could decrease the dose to 5mg , and Tramadol. CB#[phone]

## 2022-09-20 ENCOUNTER — Telehealth: Payer: Self-pay | Admitting: Orthopaedic Surgery

## 2022-09-20 ENCOUNTER — Other Ambulatory Visit: Payer: Self-pay | Admitting: Orthopaedic Surgery

## 2022-09-20 MED ORDER — TRAMADOL HCL 50 MG PO TABS: 50.0000 mg | ORAL_TABLET | Freq: Three times a day (TID) | ORAL | 0 refills | Status: DC | PRN

## 2022-09-20 MED ORDER — CELECOXIB 200 MG PO CAPS: 200.0000 mg | ORAL_CAPSULE | Freq: Two times a day (BID) | ORAL | 0 refills | Status: DC | PRN

## 2022-09-20 MED ORDER — AMOXICILLIN 875 MG PO TABS: 875.0000 mg | ORAL_TABLET | Freq: Two times a day (BID) | ORAL | 3 refills | Status: DC

## 2022-09-20 MED ORDER — CYCLOBENZAPRINE HCL 5 MG PO TABS: 5.0000 mg | ORAL_TABLET | Freq: Three times a day (TID) | ORAL | 1 refills | Status: DC | PRN

## 2022-09-20 NOTE — Telephone Encounter (Signed)
Patient called request refill medication on Amoxacillin, Celebrex, Tramadol, and flexeril 10mg . She said she only take that at night. CB#[phone]

## 2022-10-03 ENCOUNTER — Other Ambulatory Visit: Payer: Self-pay | Admitting: Orthopaedic Surgery

## 2022-10-03 ENCOUNTER — Telehealth: Payer: Self-pay | Admitting: Orthopaedic Surgery

## 2022-10-03 MED ORDER — CYCLOBENZAPRINE HCL 5 MG PO TABS: 5.0000 mg | ORAL_TABLET | Freq: Three times a day (TID) | ORAL | 1 refills | Status: DC | PRN

## 2022-10-03 MED ORDER — TRAMADOL HCL 50 MG PO TABS: 50.0000 mg | ORAL_TABLET | Freq: Three times a day (TID) | ORAL | 0 refills | Status: DC | PRN

## 2022-10-03 NOTE — Telephone Encounter (Signed)
Patient called needing Rx refilled Tramadol and Flexeril. Patient asked if the Rx can be filled today because she is leaving for vacation in the morning.  The number to contact patient [phone]

## 2022-10-15 ENCOUNTER — Other Ambulatory Visit: Payer: Self-pay | Admitting: Orthopaedic Surgery

## 2022-10-19 ENCOUNTER — Other Ambulatory Visit (INDEPENDENT_AMBULATORY_CARE_PROVIDER_SITE_OTHER): Payer: PRIVATE HEALTH INSURANCE

## 2022-10-19 ENCOUNTER — Encounter: Payer: Self-pay | Admitting: Orthopaedic Surgery

## 2022-10-19 ENCOUNTER — Ambulatory Visit: Payer: PRIVATE HEALTH INSURANCE | Admitting: Orthopaedic Surgery

## 2022-10-19 DIAGNOSIS — Z96652 Presence of left artificial knee joint: Secondary | ICD-10-CM

## 2022-10-19 MED ORDER — TRAMADOL HCL 50 MG PO TABS: 50.0000 mg | ORAL_TABLET | Freq: Three times a day (TID) | ORAL | 0 refills | Status: DC | PRN

## 2022-10-19 MED ORDER — CYCLOBENZAPRINE HCL 5 MG PO TABS: 5.0000 mg | ORAL_TABLET | Freq: Three times a day (TID) | ORAL | 1 refills | Status: DC | PRN

## 2022-10-19 NOTE — Progress Notes (Signed)
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  She has had her right knee replaced remotely.  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  I have had her on penicillin medication since then.  She is doing well overall with some occasional pain and swelling.  On exam today there is no redness of her left knee.  There is no effusion.  She has some lateral pain to be expected.  She has good range of motion of the knee is stable.  2 views of the left knee show well-seated total knee arthroplasty with no complicating features.  I would like her to continue her antibiotics for only 1 more month.  After then I want her off of antibiotics.  I will then see her back in 3 months to see how she is doing overall.  X-rays would only be needed if she is having worsening pain.  She agrees to this treatment plan.  I will refill her Flexeril and tramadol as well.

## 2022-10-20 ENCOUNTER — Other Ambulatory Visit: Payer: Self-pay | Admitting: Orthopaedic Surgery

## 2022-11-08 ENCOUNTER — Other Ambulatory Visit: Payer: Self-pay | Admitting: Orthopaedic Surgery

## 2022-11-08 ENCOUNTER — Telehealth: Payer: Self-pay | Admitting: Orthopaedic Surgery

## 2022-11-08 MED ORDER — TRAMADOL HCL 50 MG PO TABS: 50.0000 mg | ORAL_TABLET | Freq: Three times a day (TID) | ORAL | 0 refills | Status: DC | PRN

## 2022-11-08 MED ORDER — CYCLOBENZAPRINE HCL 5 MG PO TABS: 5.0000 mg | ORAL_TABLET | Freq: Three times a day (TID) | ORAL | 1 refills | Status: DC | PRN

## 2022-11-08 NOTE — Telephone Encounter (Signed)
Patient called and needs to refill tramadol, and cyclobenzaprine. CB#[phone]

## 2022-11-14 ENCOUNTER — Other Ambulatory Visit: Payer: Self-pay | Admitting: Orthopaedic Surgery

## 2022-11-29 ENCOUNTER — Other Ambulatory Visit: Payer: Self-pay | Admitting: Orthopaedic Surgery

## 2022-11-29 ENCOUNTER — Telehealth: Payer: Self-pay | Admitting: Orthopaedic Surgery

## 2022-11-29 MED ORDER — CYCLOBENZAPRINE HCL 5 MG PO TABS: 5.0000 mg | ORAL_TABLET | Freq: Three times a day (TID) | ORAL | 1 refills | Status: DC | PRN

## 2022-11-29 MED ORDER — TRAMADOL HCL 50 MG PO TABS: 50.0000 mg | ORAL_TABLET | Freq: Three times a day (TID) | ORAL | 0 refills | Status: AC | PRN

## 2022-11-29 NOTE — Telephone Encounter (Signed)
Spoke with patient, aware this will be last refill & will need to speak with PCP to continue medications due to rheumatoid pain

## 2022-11-29 NOTE — Telephone Encounter (Signed)
Patient called requesting refill on tramadol and cyclobenzaprine.

## 2023-01-19 ENCOUNTER — Encounter: Payer: Self-pay | Admitting: Orthopaedic Surgery

## 2023-01-19 ENCOUNTER — Ambulatory Visit: Payer: PRIVATE HEALTH INSURANCE | Admitting: Orthopaedic Surgery

## 2023-01-19 DIAGNOSIS — Z96652 Presence of left artificial knee joint: Secondary | ICD-10-CM

## 2023-01-19 MED ORDER — DICLOFENAC SODIUM 75 MG PO TBEC
75.0000 mg | DELAYED_RELEASE_TABLET | Freq: Two times a day (BID) | ORAL | 3 refills | Status: DC | PRN
Start: 2023-01-19 — End: 2023-02-01

## 2023-01-19 NOTE — Progress Notes (Signed)
The patient is continue to follow-up as a relates to left knee polyliner exchange and synovectomy on 05/06/2022.  Her original knee replacement on that left knee was in December 2023.  She then developed an infection but it was not any type of resistant infection and it was something that even the infectious disease specialist said that she treat minimally with penicillin.  She has been off antibiotics for some time now.  She reports good range of motion and strength but still some swelling and inflammation with her knee.  Examination of the left knee she is a looks good today.  Is not warm.  She is stiff throughout the arc of motion but overall the knee looks good and feels ligamentously stable.  She will continue to increase her activities as comfort allows.  We will try diclofenac as an anti-inflammatory per her request.  She can take this with food to ease any effective would have on her stomach.  Will see her back in 6 months will have a standing AP and lateral of her left knee at that standpoint

## 2023-02-01 ENCOUNTER — Other Ambulatory Visit: Payer: Self-pay | Admitting: Orthopaedic Surgery

## 2023-02-01 ENCOUNTER — Encounter: Payer: Self-pay | Admitting: Orthopaedic Surgery

## 2023-02-01 MED ORDER — CELECOXIB 200 MG PO CAPS: 200.0000 mg | ORAL_CAPSULE | Freq: Two times a day (BID) | ORAL | 3 refills | Status: DC | PRN

## 2023-03-18 ENCOUNTER — Encounter: Payer: Self-pay | Admitting: Orthopaedic Surgery

## 2023-03-20 ENCOUNTER — Other Ambulatory Visit: Payer: Self-pay

## 2023-03-20 MED ORDER — CYCLOBENZAPRINE HCL 5 MG PO TABS: 5.0000 mg | ORAL_TABLET | Freq: Three times a day (TID) | ORAL | 1 refills | Status: DC | PRN

## 2023-06-16 ENCOUNTER — Other Ambulatory Visit: Payer: Self-pay | Admitting: Orthopaedic Surgery

## 2023-07-19 ENCOUNTER — Other Ambulatory Visit (INDEPENDENT_AMBULATORY_CARE_PROVIDER_SITE_OTHER): Payer: PRIVATE HEALTH INSURANCE

## 2023-07-19 ENCOUNTER — Encounter: Payer: Self-pay | Admitting: Orthopaedic Surgery

## 2023-07-19 ENCOUNTER — Ambulatory Visit (INDEPENDENT_AMBULATORY_CARE_PROVIDER_SITE_OTHER): Payer: PRIVATE HEALTH INSURANCE | Admitting: Orthopaedic Surgery

## 2023-07-19 DIAGNOSIS — Z96652 Presence of left artificial knee joint: Secondary | ICD-10-CM | POA: Diagnosis not present

## 2023-07-19 DIAGNOSIS — M25532 Pain in left wrist: Secondary | ICD-10-CM | POA: Diagnosis not present

## 2023-07-19 NOTE — Progress Notes (Signed)
 The patient is getting close to 2 years out from a left total knee replacement.  In May of last year we had to take her to the operating room for an indolent infection in her knee that fortunately was something that was easily treatable with penicillin and an I&D with polyliner exchange.  She has cleared that infection and is doing well with the left knee.  She still gets some stiffness and swelling on occasion.  NuPrep examination of her left knee shows excellent range of motion of left knee with no effusion today.  The knee is loosely stable.  There is no redness and the incisions healed nicely.  2 views of the left knee show well-seated left total knee arthroplasty with no evidence of infection or loosening or complicating features.  This point follow-up for her knee can be as needed.  If things worsen at all she knows to let us  know.  She did let me know that she has been having some left wrist pain over the ulnar side of her left wrist where she points to.  This may have been from pushing up a lot getting out of a chair and she has been through physical therapy.  She is requesting a steroid injection on that left wrist around the lateral aspect open not agreed to this on the ulnar aspect of the wrist.  She did tolerated the injection well.  At this point follow-up can be as needed.

## 2023-11-06 ENCOUNTER — Encounter: Payer: Self-pay | Admitting: Radiology

## 2023-11-15 ENCOUNTER — Other Ambulatory Visit: Payer: Self-pay | Admitting: Orthopaedic Surgery
